# Patient Record
Sex: Male | Born: 1955 | Race: White | Hispanic: No | Marital: Married | Smoking: Never smoker
Health system: Southern US, Community
[De-identification: ages and names within clinical notes are randomized; demographics above are authoritative.]

---

## 1997-12-01 ENCOUNTER — Encounter: Admission: RE | Admit: 1997-12-01 | Discharge: 1998-02-18 | Payer: Self-pay | Admitting: Anesthesiology

## 2004-05-17 DIAGNOSIS — K219 Gastro-esophageal reflux disease without esophagitis: Secondary | ICD-10-CM | POA: Insufficient documentation

## 2004-05-17 DIAGNOSIS — I10 Essential (primary) hypertension: Secondary | ICD-10-CM | POA: Insufficient documentation

## 2004-05-17 DIAGNOSIS — G4730 Sleep apnea, unspecified: Secondary | ICD-10-CM | POA: Insufficient documentation

## 2013-03-18 DIAGNOSIS — C7A8 Other malignant neuroendocrine tumors: Secondary | ICD-10-CM | POA: Insufficient documentation

## 2013-05-15 DIAGNOSIS — D696 Thrombocytopenia, unspecified: Secondary | ICD-10-CM | POA: Insufficient documentation

## 2013-05-15 DIAGNOSIS — R17 Unspecified jaundice: Secondary | ICD-10-CM | POA: Insufficient documentation

## 2013-05-15 DIAGNOSIS — E876 Hypokalemia: Secondary | ICD-10-CM | POA: Insufficient documentation

## 2013-05-15 DIAGNOSIS — R1013 Epigastric pain: Secondary | ICD-10-CM | POA: Insufficient documentation

## 2019-12-07 ENCOUNTER — Ambulatory Visit (HOSPITAL_COMMUNITY)
Admission: RE | Admit: 2019-12-07 | Discharge: 2019-12-07 | Disposition: A | Discharge status: Home / Self Care | Payer: Medicare Other | Source: Ambulatory Visit | Attending: Pulmonary Disease | Admitting: Pulmonary Disease

## 2019-12-07 ENCOUNTER — Other Ambulatory Visit (HOSPITAL_COMMUNITY): Payer: Self-pay | Admitting: Family

## 2019-12-07 DIAGNOSIS — Z23 Encounter for immunization: Secondary | ICD-10-CM | POA: Diagnosis not present

## 2019-12-07 DIAGNOSIS — U071 COVID-19: Secondary | ICD-10-CM

## 2019-12-07 MED ORDER — METHYLPREDNISOLONE SODIUM SUCC 125 MG IJ SOLR: 125.0000 mg | Freq: Once | INTRAMUSCULAR | Status: DC | PRN

## 2019-12-07 MED ORDER — DIPHENHYDRAMINE HCL 50 MG/ML IJ SOLN: 50.0000 mg | Freq: Once | INTRAMUSCULAR | Status: DC | PRN

## 2019-12-07 MED ORDER — ACETAMINOPHEN 325 MG PO TABS
650.0000 mg | ORAL_TABLET | Freq: Once | ORAL | Status: AC
  Administered 2019-12-07: 650 mg via ORAL
  Filled 2019-12-07: qty 2

## 2019-12-07 MED ORDER — SODIUM CHLORIDE 0.9 % IV SOLN: INTRAVENOUS | Status: DC | PRN

## 2019-12-07 MED ORDER — FAMOTIDINE IN NACL 20-0.9 MG/50ML-% IV SOLN: 20.0000 mg | Freq: Once | INTRAVENOUS | Status: DC | PRN

## 2019-12-07 MED ORDER — EPINEPHRINE 0.3 MG/0.3ML IJ SOAJ: 0.3000 mg | Freq: Once | INTRAMUSCULAR | Status: DC | PRN

## 2019-12-07 MED ORDER — ALBUTEROL SULFATE HFA 108 (90 BASE) MCG/ACT IN AERS: 2.0000 | INHALATION_SPRAY | Freq: Once | RESPIRATORY_TRACT | Status: DC | PRN

## 2019-12-07 MED ORDER — SODIUM CHLORIDE 0.9 % IV SOLN: Freq: Once | INTRAVENOUS | Status: AC

## 2019-12-07 NOTE — Discharge Instructions (Signed)

## 2019-12-07 NOTE — Progress Notes (Signed)
I connected by phone with Michael Good on 12/07/2019 at 2:15 PM to discuss the potential use of a new treatment for mild to moderate COVID-19 viral infection in non-hospitalized patients.  This patient is a 64 y.o. male that meets the FDA criteria for Emergency Use Authorization of COVID monoclonal antibody casirivimab/imdevimab or bamlanivimab/eteseviamb.  Has a (+) direct SARS-CoV-2 viral test result  Has mild or moderate COVID-19   Is NOT hospitalized due to COVID-19  Is within 10 days of symptom onset  Has at least one of the high risk factor(s) for progression to severe COVID-19 and/or hospitalization as defined in EUA.  Specific high risk criteria : Older age (>/= 66 yo), Diabetes and Cardiovascular disease or hypertension   Symptoms of sweats and aches began 10/5.   I have spoken and communicated the following to the patient or parent/caregiver regarding COVID monoclonal antibody treatment:  1. FDA has authorized the emergency use for the treatment of mild to moderate COVID-19 in adults and pediatric patients with positive results of direct SARS-CoV-2 viral testing who are 49 years of age and older weighing at least 40 kg, and who are at high risk for progressing to severe COVID-19 and/or hospitalization.  2. The significant known and potential risks and benefits of COVID monoclonal antibody, and the extent to which such potential risks and benefits are unknown.  3. Information on available alternative treatments and the risks and benefits of those alternatives, including clinical trials.  4. Patients treated with COVID monoclonal antibody should continue to self-isolate and use infection control measures (e.g., wear mask, isolate, social distance, avoid sharing personal items, clean and disinfect "high touch" surfaces, and frequent handwashing) according to CDC guidelines.   5. The patient or parent/caregiver has the option to accept or refuse COVID monoclonal antibody  treatment.  After reviewing this information with the patient, the patient has agreed to receive one of the available covid 19 monoclonal antibodies and will be provided an appropriate fact sheet prior to infusion. [name], NP 12/07/2019 2:15 PM

## 2019-12-07 NOTE — Progress Notes (Signed)
  Diagnosis: COVID-19  Physician: Asencion Noble, MD   Procedure: Covid Infusion Clinic Med: bamlanivimab\etesevimab infusion - Provided patient with bamlanimivab\etesevimab fact sheet for patients, parents and caregivers prior to infusion.  Complications: No immediate complications noted.  Discharge: Discharged home   Michael Good 12/07/2019

## 2020-01-24 ENCOUNTER — Telehealth: Payer: Self-pay | Admitting: Oncology

## 2020-01-24 NOTE — Telephone Encounter (Signed)
Scheduled appointment per new patient referral. Per patient request appointment was scheduled near the end of December. Patient is aware of appointment date and time.

## 2020-01-30 ENCOUNTER — Ambulatory Visit
Admission: RE | Admit: 2020-01-30 | Discharge: 2020-01-30 | Disposition: A | Discharge status: Home / Self Care | Payer: Self-pay | Source: Ambulatory Visit | Attending: Oncology | Admitting: Oncology

## 2020-01-30 ENCOUNTER — Other Ambulatory Visit: Payer: Self-pay

## 2020-01-30 ENCOUNTER — Ambulatory Visit (HOSPITAL_COMMUNITY): Payer: Medicare Other | Admitting: Hematology

## 2020-01-30 DIAGNOSIS — C7B8 Other secondary neuroendocrine tumors: Secondary | ICD-10-CM

## 2020-01-30 NOTE — Progress Notes (Signed)
Requested outside images from [facility] be pushed to [employer].  Requested last two PET scans and most recent CT scan.  Received confirmation this has been done.  Order for outside films was placed.

## 2020-02-04 NOTE — Progress Notes (Signed)
Left voice message for patient informing him his original appointment scheduled for 12/28 at 2 pm with Dr. Benay Spice has to be moved to 12/30 at 2 pm instead.  I left my direct call back number for patient to call me back to confirm.

## 2020-02-05 ENCOUNTER — Telehealth: Payer: Self-pay

## 2020-02-05 NOTE — Telephone Encounter (Signed)
Patient calls back and states he is unable to come on Thursdays or Fridays for appointments due to work. He also was requesting a 4:00 pm appointment.  I explained that Dr. Gearldine Shown new patient slots are at 2 pm.  I have tentatively switched him to Monday 12/27 at 2 pm and he is to call me back tomorrow to let me know if he can come as he is at work and does not have his calendar with him.

## 2020-02-20 ENCOUNTER — Other Ambulatory Visit: Payer: Self-pay | Admitting: *Deleted

## 2020-02-20 NOTE — Progress Notes (Signed)
Reconciled med list from [place] records. Will review w/patient on 02/24/20 visit.

## 2020-02-24 ENCOUNTER — Inpatient Hospital Stay: Payer: Medicare Other | Admitting: Oncology

## 2020-02-25 ENCOUNTER — Ambulatory Visit: Payer: Medicare Other | Admitting: Oncology

## 2020-02-27 ENCOUNTER — Ambulatory Visit: Payer: Medicare Other | Admitting: Oncology

## 2020-03-10 ENCOUNTER — Other Ambulatory Visit: Payer: Self-pay

## 2020-03-10 ENCOUNTER — Inpatient Hospital Stay: Payer: Medicare Other | Attending: Oncology | Admitting: Oncology

## 2020-03-10 ENCOUNTER — Other Ambulatory Visit: Payer: Self-pay | Admitting: *Deleted

## 2020-03-10 DIAGNOSIS — E1165 Type 2 diabetes mellitus with hyperglycemia: Secondary | ICD-10-CM | POA: Insufficient documentation

## 2020-03-10 DIAGNOSIS — G4730 Sleep apnea, unspecified: Secondary | ICD-10-CM | POA: Insufficient documentation

## 2020-03-10 DIAGNOSIS — C7B03 Secondary carcinoid tumors of bone: Secondary | ICD-10-CM | POA: Insufficient documentation

## 2020-03-10 DIAGNOSIS — C7B02 Secondary carcinoid tumors of liver: Secondary | ICD-10-CM | POA: Diagnosis not present

## 2020-03-10 DIAGNOSIS — C7B09 Secondary carcinoid tumors of other sites: Secondary | ICD-10-CM | POA: Insufficient documentation

## 2020-03-10 DIAGNOSIS — Z79899 Other long term (current) drug therapy: Secondary | ICD-10-CM | POA: Insufficient documentation

## 2020-03-10 DIAGNOSIS — I10 Essential (primary) hypertension: Secondary | ICD-10-CM | POA: Insufficient documentation

## 2020-03-10 DIAGNOSIS — D696 Thrombocytopenia, unspecified: Secondary | ICD-10-CM | POA: Insufficient documentation

## 2020-03-10 DIAGNOSIS — C7A090 Malignant carcinoid tumor of the bronchus and lung: Secondary | ICD-10-CM | POA: Insufficient documentation

## 2020-03-10 DIAGNOSIS — N529 Male erectile dysfunction, unspecified: Secondary | ICD-10-CM | POA: Insufficient documentation

## 2020-03-10 DIAGNOSIS — Z801 Family history of malignant neoplasm of trachea, bronchus and lung: Secondary | ICD-10-CM | POA: Insufficient documentation

## 2020-03-10 DIAGNOSIS — Z8616 Personal history of COVID-19: Secondary | ICD-10-CM

## 2020-03-10 DIAGNOSIS — Z9049 Acquired absence of other specified parts of digestive tract: Secondary | ICD-10-CM

## 2020-03-10 MED ORDER — EVEROLIMUS 7.5 MG PO TABS: 7.5000 mg | ORAL_TABLET | Freq: Every day | ORAL | 0 refills | Status: DC

## 2020-03-10 NOTE — Progress Notes (Signed)
Michael Good New Patient Consult   Requesting MD: Janetta Hora, Md [place],  [place] [address]   [place]. 65 y.o.  [birth year]    Reason for Consult: Metastatic carcinoid tumor   HPI: Mr. Cheuvront reports developing "pneumonia "prior to being discovered to have a left lung lesion in 2004.  He underwent a left lower lobectomy in February 2004 at [facility].  He was confirmed to have a 2.2 cm typical carcinoid tumor. In November 2014 he underwent a laparoscopic cholecystectomy.  Imaging at that time showed a 4.4 cm liver lesion.  He underwent a central hepatectomy on 03/18/2013 with a segment 4 and 5 resection revealing a low-grade carcinoid tumor.  The Ki-6 7 returned at less than 5%. CTs in March 2016 showed no evidence of recurrence.  He had jaundice and common bile duct stones removed in March 2015 in March 2018.  CTs in June 2018 and July 2019 showed no evidence of recurrent disease.  A CT on 11/12/2018 showed at least 3 new liver lesions.  A CT-guided biopsy 11/28/2018 revealed no malignancy and was suspicious for sampling error.  A PET dotatate scan on 12/26/2018 showed at least 20 liver lesions and extensive bone metastases.  A hypermetabolic lesion in the pancreas tail was adjacent to a known pancreatic cystic lesion. A repeat biopsy of the left liver on 01/16/2019 showed a metastatic neuroendocrine tumor with aKi-67 of 10% (interpretation was limited due to scant remaining material and may not represent the overall lesion) mismatch repair protein expression was intact.  He began every 4 weeks Sandostatin at a dose of 30 mg on 02/27/2019.  On 09/18/2019 and Netspot revealed an increase in activity of the pancreas tail, liver, and bone metastases.  Mr. Vegh started dose reduced everolimus, 7.5 mg daily, and August 2021.  This was placed on hold in September due to new onset diabetes.  He was restarted on everolimus 01/22/2020.  He reports  missing the Sandostatin injection last month.  Mr. Housey is transferring his oncology care to [facility] since this is closer to his home in [place].  Past medical history: 1. Diabetes 2. COVID-19 infection September 2021 3. Hypertension 4. sleep apnea  Past surgical history: 1. cholecystectomy 2014 2. drainage of abdominal abscess 03/18/2013 3. Central hepatectomy, segment 4B, 5, and part of for a, small anterior part of segment 8 03/18/2013 4. left lung resection 5. umbilical hernia repair 08/17/3557 6. vasectomy 1997  Medications: Reviewed  Allergies:  Allergies  Allergen Reactions  . Erythromycin Nausea Only and Rash    Family history: His mother died of lung cancer and was a smoker  Social History:   He lives with his wife in [place].  He works as an Systems developer.  He does not use cigarettes or alcohol.  No transfusion history.  No risk factor for HIV or hepatitis.  ROS:   Positives include: Loose stool once per day, abdominal hernia, farsighted-uses reading glasses, erectile dysfunction  A complete ROS was otherwise negative.  Physical Exam:  There were no vitals taken for this visit.  HEENT: No thrush or ulcers Lungs: Clear bilaterally Cardiac: Regular rate and rhythm Abdomen: No mass, nontender, no hepatosplenomegaly, reducible ventral hernia  Vascular: No leg edema Lymph nodes: No cervical, supraclavicular, axillary, or inguinal nodes Neurologic: Alert and oriented, the motor exam appears intact in the upper and lower extremities bilaterally Skin: Plaques of gray/brown discoloration at the anterior aspect of the right knee,  elbows, and dorsum of the PIP joints Musculoskeletal: No spine tenderness   LAB:  Chromogranin A on 01/22/2020: 832  Hemoglobin 12.6, WBC 4.0, platelets 93,000, ANC 1.92 on 01/22/2020  Imaging:  As per HPI, Netspot images from October 2020 and July 2021 reviewed   Assessment/Plan:   1. Metastatic  carcinoid tumor  Left lower lobectomy February 2004 at [facility], 2.2 cm typical carcinoid tumor  Laparoscopic cholecystectomy November 2014, imaging revealed a 4.4 cm segment 8 liver lesion  03/18/2013-central hepatectomy and drainage of intra-abdominal abscess,- low-grade neuroendocrine carcinoma, tumor appeared totally excised,Ki-67 less than 5%  CT's 05/09/2014, 08/16/2016, 09/19/2017-no evidence of recurrent disease  CTs 914 2020-3 new liver lesions  12/26/2018 PET dotatate scan-at least 20 liver lesions and extensive bone metastases, hypermetabolic pancreas tail lesion 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  01/16/2019-biopsy of the left hepatic lobe lesion-metastatic neuroendocrine carcinoma,Ki-67 10%- interpretation limited due to scant remaining tumor may not be representative of the overall lesion, mismatch repair protein staining intact  02/27/2019-Sandostatin 30 mg every 4 weeks  09/18/2019- Netspot PET-increased activity in the pancreas tail and liver and bone metastases  Late August 2021-everolimus, 7.5 mg daily, placed on hold September 2021 due to acute hyperglycemia/new onset diabetes  01/22/2020-everolimus resumed 2. Diabetes 3. ERCP removal of bile duct stones March 2015, March 2018 4. Sleep apnea 5. Hypertension 6. COVID-19 infection September 2021 7. Cholecystectomy November 2014 8. Umbilical hernia repair 7/90/2409 9. Erectile dysfunction 10. Chronic thrombocytopenia   Disposition:   Mr Jeffries was diagnosed with a carcinoid tumor involving resection of a lung mass in 2004.  He now has hypermetabolic carcinoid tumor involving multiple liver lesions, the pancreas, and bones.  It is unclear whether current metastatic disease is related to a pancreas neuroendocrine tumor versus a lung primary.  I discussed the prognosis and treatment options with Mr. Radigan.  He appears asymptomatic at present.  He has been maintained on everolimus since August 2021 with an extended  treatment break after he was diagnosed with diabetes.  The plan is to continue Sandostatin and everolimus for now.  He does not wish to undergo repeat imaging at present.  He will return for labs, to include a baseline chromogranin a level, and Sandostatin in 1 week.  He will receive the COVID-19 vaccine when he is here next week.  He will be scheduled for an office and lab visit in 5 weeks.  We will plan for a restaging Netspot within the next few months.  I will present his case at the GI tumor conference.  We discussed possible treatment with Lutathera if he develops clear disease progression while on everolimus.  Betsy Coder, MD  03/10/2020, 2:04 PM

## 2020-03-10 NOTE — Progress Notes (Signed)
Met patient at his initial medical oncology appointment with Dr. Brad Sherrill.  Patient had previously been seen by [facility] however his oncologist left and was moving further away so he is seeking treatment here.  I explained my role as nurse navigator and he was given my direct contact information.  He will return next Tuesday 03/17/2020 (patient's preferred return date) for labs and Sandostatin injection (upon insurance approval).  He is going to seek getting the Covid vaccines locally as to to not have to drive the long distance and miss time from work.  I recommended his pharmacy will more than likely have the vaccine.  He verbalized an understanding. I escorted him to scheduling.  

## 2020-03-11 ENCOUNTER — Other Ambulatory Visit: Payer: Self-pay | Admitting: Oncology

## 2020-03-11 ENCOUNTER — Telehealth: Payer: Self-pay | Admitting: Oncology

## 2020-03-11 ENCOUNTER — Telehealth: Payer: Self-pay

## 2020-03-11 ENCOUNTER — Telehealth: Payer: Self-pay | Admitting: Pharmacist

## 2020-03-11 DIAGNOSIS — C7A090 Malignant carcinoid tumor of the bronchus and lung: Secondary | ICD-10-CM

## 2020-03-11 MED ORDER — EVEROLIMUS 7.5 MG PO TABS: 7.5000 mg | ORAL_TABLET | Freq: Every day | ORAL | 0 refills | Status: DC

## 2020-03-11 MED FILL — EVEROLIMUS 7.5 MG TABS: 7.5 | 28 days supply | Qty: 28 | Fill #0

## 2020-03-11 NOTE — Telephone Encounter (Addendum)
Oral Oncology Pharmacist Encounter  Received prescription for Afinitor (everolimus) for the continuation of treatment of metastatic carcinoid tumor in conjunction with Sandostatin, planned duration until disease progression or unacceptable drug toxicity.  Prescription dose and frequency assessed for appropriateness. Patient has been on Afinitor since August 2021. Quantity updated from #30 to #28 due to package size of product. Appropriate for continuation.   CBC w/ Diff and CMP from 01/22/20 assessed, noted pltc of 93 K/uL - CMP WNL. No change in dose warranted.  Current medication list in Epic reviewed, no relevant/significant DDIs with Afinitor identified.  Evaluated chart and no patient barriers to medication adherence noted.   Prescription has been e-scribed to the [facility] for benefits analysis and approval.  Oral Oncology Clinic will continue to follow for insurance authorization, copayment issues, and follow-up counseling.  Leron Croak, PharmD, BCPS Hematology/Oncology Clinical Pharmacist [facility] [phone] 03/11/2020 10:15 AM

## 2020-03-11 NOTE — Telephone Encounter (Signed)
Oral Chemotherapy Pharmacist Encounter  I spoke with patient for overview of: Afinitor (everolimus) for the continued treatment of metastatic carcinoid tumor in conjunction with Sandostatin, planned duration until disease progression or unacceptable toxicity.   Reviewed with patient administration, dosing, side effects, monitoring, drug-food interactions, safe handling, storage, and disposal.  Patient will take Afinitor 7.5mg  tablets, 1 tablet by mouth once daily, with water, without regard to food.  Patient understands to take Afinitor consistently with regards to food and at approximately the same time each day.  Patient knows to aviod grapefruit or grapefruit juice while on therapy with Afinitor.  Afinitor start date: August 2021  Adverse effects include but are not limited to: mouth sores, GI upset, nausea, diarrhea, constipation, rash, increased blood sugars, decreased blood counts, increased blood pressure, pulmonary toxicity, and edema.   Reviewed with patient importance of keeping a medication schedule and plan for any missed doses. No barriers to medication adherence identified.  Medication reconciliation performed and medication/allergy list updated.  Insurance authorization for Afinitor has been obtained. Test claim at the pharmacy revealed copayment $70 for 1st fill of Afinitor. This will ship from the [facility] on 03/11/20 to deliver to patient's home on 03/12/20.  Patient informed the pharmacy will reach out 5-7 days prior to needing next fill of Afinitor to coordinate continued medication acquisition to prevent break in therapy.  All questions answered.  Mr. Brayfield voiced understanding and appreciation.   Patient declined medication education handout since he has been on treatment since August 2021. Patient knows to call the office with questions or concerns. Oral Chemotherapy Clinic phone number provided to patient.   Leron Croak, PharmD,  BCPS Hematology/Oncology Clinical Pharmacist [facility] [phone] 03/11/2020 4:09 PM

## 2020-03-11 NOTE — Telephone Encounter (Signed)
Scheduled appointments per 1/11 los. Spoke to patient who is aware of appointments on 1/18. Will have  Updated calendar printed for patient.

## 2020-03-11 NOTE — Telephone Encounter (Signed)
Oral Oncology Patient Advocate Encounter  After completing a benefits investigation, prior authorization for Afinitor is not required at this time through [facility].  Patient's copay is $70.   Michael Good Patient [name] Phone [phone] Fax [phone] 03/11/2020 10:38 AM

## 2020-03-17 ENCOUNTER — Inpatient Hospital Stay: Payer: Medicare Other

## 2020-03-18 ENCOUNTER — Other Ambulatory Visit: Payer: Self-pay

## 2020-03-18 NOTE — Progress Notes (Signed)
The proposed treatment discussed in conference is for discussion purposes only and is not a binding recommendation.  The patients have not been physically examined, or presented with their treatment options.  Therefore, final treatment plans cannot be decided.   

## 2020-03-24 ENCOUNTER — Inpatient Hospital Stay: Payer: Medicare Other

## 2020-03-24 ENCOUNTER — Other Ambulatory Visit: Payer: Self-pay

## 2020-03-24 VITALS — BP 147/73 | HR 72

## 2020-03-24 DIAGNOSIS — C7A090 Malignant carcinoid tumor of the bronchus and lung: Secondary | ICD-10-CM | POA: Diagnosis not present

## 2020-03-24 DIAGNOSIS — C7B09 Secondary carcinoid tumors of other sites: Secondary | ICD-10-CM | POA: Diagnosis not present

## 2020-03-24 DIAGNOSIS — N529 Male erectile dysfunction, unspecified: Secondary | ICD-10-CM | POA: Diagnosis not present

## 2020-03-24 DIAGNOSIS — Z9049 Acquired absence of other specified parts of digestive tract: Secondary | ICD-10-CM | POA: Diagnosis not present

## 2020-03-24 DIAGNOSIS — E1165 Type 2 diabetes mellitus with hyperglycemia: Secondary | ICD-10-CM | POA: Diagnosis not present

## 2020-03-24 DIAGNOSIS — D696 Thrombocytopenia, unspecified: Secondary | ICD-10-CM | POA: Diagnosis not present

## 2020-03-24 DIAGNOSIS — C7B03 Secondary carcinoid tumors of bone: Secondary | ICD-10-CM | POA: Diagnosis present

## 2020-03-24 DIAGNOSIS — Z8616 Personal history of COVID-19: Secondary | ICD-10-CM | POA: Diagnosis not present

## 2020-03-24 DIAGNOSIS — Z79899 Other long term (current) drug therapy: Secondary | ICD-10-CM | POA: Diagnosis not present

## 2020-03-24 DIAGNOSIS — C7B02 Secondary carcinoid tumors of liver: Secondary | ICD-10-CM | POA: Diagnosis present

## 2020-03-24 DIAGNOSIS — G4730 Sleep apnea, unspecified: Secondary | ICD-10-CM | POA: Diagnosis not present

## 2020-03-24 DIAGNOSIS — I10 Essential (primary) hypertension: Secondary | ICD-10-CM | POA: Diagnosis not present

## 2020-03-24 DIAGNOSIS — Z801 Family history of malignant neoplasm of trachea, bronchus and lung: Secondary | ICD-10-CM | POA: Diagnosis not present

## 2020-03-24 LAB — CBC WITH DIFFERENTIAL (CANCER CENTER ONLY)
Abs Immature Granulocytes: 0.01 10*3/uL (ref 0.00–0.07)
Basophils Absolute: 0 10*3/uL (ref 0.0–0.1)
Basophils Relative: 1 %
Eosinophils Absolute: 0.1 10*3/uL (ref 0.0–0.5)
Eosinophils Relative: 3 %
HCT: 36.4 % — ABNORMAL LOW (ref 39.0–52.0)
Hemoglobin: 13.1 g/dL (ref 13.0–17.0)
Immature Granulocytes: 0 %
Lymphocytes Relative: 30 %
Lymphs Abs: 1.3 10*3/uL (ref 0.7–4.0)
MCH: 30.4 pg (ref 26.0–34.0)
MCHC: 36 g/dL (ref 30.0–36.0)
MCV: 84.5 fL (ref 80.0–100.0)
Monocytes Absolute: 0.2 10*3/uL (ref 0.1–1.0)
Monocytes Relative: 6 %
Neutro Abs: 2.6 10*3/uL (ref 1.7–7.7)
Neutrophils Relative %: 60 %
Platelet Count: 89 10*3/uL — ABNORMAL LOW (ref 150–400)
RBC: 4.31 MIL/uL (ref 4.22–5.81)
RDW: 12.6 % (ref 11.5–15.5)
WBC Count: 4.3 10*3/uL (ref 4.0–10.5)
nRBC: 0 % (ref 0.0–0.2)

## 2020-03-24 LAB — CMP (CANCER CENTER ONLY)
ALT: 90 U/L — ABNORMAL HIGH (ref 0–44)
AST: 63 U/L — ABNORMAL HIGH (ref 15–41)
Albumin: 4.2 g/dL (ref 3.5–5.0)
Alkaline Phosphatase: 112 U/L (ref 38–126)
Anion gap: 9 (ref 5–15)
BUN: 13 mg/dL (ref 8–23)
CO2: 26 mmol/L (ref 22–32)
Calcium: 8.7 mg/dL — ABNORMAL LOW (ref 8.9–10.3)
Chloride: 102 mmol/L (ref 98–111)
Creatinine: 1.4 mg/dL — ABNORMAL HIGH (ref 0.61–1.24)
GFR, Estimated: 55 mL/min — ABNORMAL LOW (ref >60)
Glucose, Bld: 393 mg/dL — ABNORMAL HIGH (ref 70–99)
Potassium: 3.8 mmol/L (ref 3.5–5.1)
Sodium: 137 mmol/L (ref 135–145)
Total Bilirubin: 1.6 mg/dL — ABNORMAL HIGH (ref 0.3–1.2)
Total Protein: 7.8 g/dL (ref 6.5–8.1)

## 2020-03-24 LAB — MAGNESIUM: Magnesium: 1.7 mg/dL (ref 1.7–2.4)

## 2020-03-24 LAB — LIPID PANEL
Cholesterol: 199 mg/dL (ref 0–200)
HDL: 43 mg/dL (ref >40)
LDL Cholesterol: 108 mg/dL — ABNORMAL HIGH (ref 0–99)
Total CHOL/HDL Ratio: 4.6 RATIO
Triglycerides: 238 mg/dL — ABNORMAL HIGH (ref <150)
VLDL: 48 mg/dL — ABNORMAL HIGH (ref 0–40)

## 2020-03-24 LAB — PHOSPHORUS: Phosphorus: 3.1 mg/dL (ref 2.5–4.6)

## 2020-03-24 MED ORDER — OCTREOTIDE ACETATE 30 MG IM KIT
PACK | INTRAMUSCULAR | Status: AC
  Filled 2020-03-24: qty 1

## 2020-03-24 MED ORDER — OCTREOTIDE ACETATE 30 MG IM KIT
30.0000 mg | PACK | Freq: Once | INTRAMUSCULAR | Status: AC
  Administered 2020-03-24: 30 mg via INTRAMUSCULAR

## 2020-03-24 NOTE — Patient Instructions (Signed)
Octreotide injection solution What is this medicine? OCTREOTIDE (ok TREE oh tide) is used to reduce blood levels of growth hormone in patients with a condition called acromegaly. This medicine also reduces flushing and watery diarrhea caused by certain types of cancer. This medicine may be used for other purposes; ask your health care provider or pharmacist if you have questions. COMMON BRAND NAME(S): Bynfezia, Sandostatin What should I tell my health care provider before I take this medicine? They need to know if you have any of these conditions:  diabetes  gallbladder disease  kidney disease  liver disease  thyroid disease  an unusual or allergic reaction to octreotide, other medicines, foods, dyes, or preservatives  pregnant or trying to get pregnant  breast-feeding How should I use this medicine? This medicine is for injection under the skin or into a vein (only in emergency situations). It is usually given by a health care professional in a hospital or clinic setting. If you get this medicine at home, you will be taught how to prepare and give this medicine. Allow the injection solution to come to room temperature before use. Do not warm it artificially. Use exactly as directed. Take your medicine at regular intervals. Do not take your medicine more often than directed. It is important that you put your used needles and syringes in a special sharps container. Do not put them in a trash can. If you do not have a sharps container, call your pharmacist or healthcare provider to get one. Talk to your pediatrician regarding the use of this medicine in children. Special care may be needed. Overdosage: If you think you have taken too much of this medicine contact a poison control center or emergency room at once. NOTE: This medicine is only for you. Do not share this medicine with others. What if I miss a dose? If you miss a dose, take it as soon as you can. If it is almost time for your  next dose, take only that dose. Do not take double or extra doses. What may interact with this medicine?  bromocriptine  certain medicines for blood pressure, heart disease, irregular heartbeat  cyclosporine  diuretics  medicines for diabetes, including insulin  quinidine This list may not describe all possible interactions. Give your health care provider a list of all the medicines, herbs, non-prescription drugs, or dietary supplements you use. Also tell them if you smoke, drink alcohol, or use illegal drugs. Some items may interact with your medicine. What should I watch for while using this medicine? Visit your doctor or health care professional for regular checks on your progress. To help reduce irritation at the injection site, use a different site for each injection and make sure the solution is at room temperature before use. This medicine may cause decreases in blood sugar. Signs of low blood sugar include chills, cool, pale skin or cold sweats, drowsiness, extreme hunger, fast heartbeat, headache, nausea, nervousness or anxiety, shakiness, trembling, unsteadiness, tiredness, or weakness. Contact your doctor or health care professional right away if you experience any of these symptoms. This medicine may increase blood sugar. Ask your healthcare provider if changes in diet or medicines are needed if you have diabetes. This medicine may cause a decrease in vitamin B12. You should make sure that you get enough vitamin B12 while you are taking this medicine. Discuss the foods you eat and the vitamins you take with your health care professional. What side effects may I notice from receiving this medicine? Side   effects that you should report to your doctor or health care professional as soon as possible:  allergic reactions like skin rash, itching or hives, swelling of the face, lips, or tongue  fast, slow, or irregular heartbeat  right upper belly pain  severe stomach pain  signs  and symptoms of high blood sugar such as being more thirsty or hungry or having to urinate more than normal. You may also feel very tired or have blurry vision.  signs and symptoms of low blood sugar such as feeling anxious; confusion; dizziness; increased hunger; unusually weak or tired; increased sweating; shakiness; cold, clammy skin; irritable; headache; blurred vision; fast heartbeat; loss of consciousness  unusually weak or tired Side effects that usually do not require medical attention (report to your doctor or health care professional if they continue or are bothersome):  diarrhea  dizziness  gas  headache  nausea, vomiting  pain, redness, or irritation at site where injected  upset stomach This list may not describe all possible side effects. Call your doctor for medical advice about side effects. You may report side effects to FDA at 1-800-FDA-1088. Where should I keep my medicine? Keep out of the reach of children. Store in a refrigerator between 2 and 8 degrees C (36 and 46 degrees F). Protect from light. Allow to come to room temperature naturally. Do not use artificial heat. If protected from light, the injection may be stored at room temperature between 20 and 30 degrees C (70 and 86 degrees F) for 14 days. After the initial use, throw away any unused portion of a multiple dose vial after 14 days. Throw away unused portions of the ampules after use. NOTE: This sheet is a summary. It may not cover all possible information. If you have questions about this medicine, talk to your doctor, pharmacist, or health care provider.  2021 Elsevier/Gold Standard (2018-09-13 13:33:09)  

## 2020-03-27 LAB — CHROMOGRANIN A: Chromogranin A (ng/mL): 2994 ng/mL — ABNORMAL HIGH (ref 0.0–101.8)

## 2020-04-08 ENCOUNTER — Other Ambulatory Visit: Payer: Self-pay | Admitting: Oncology

## 2020-04-08 DIAGNOSIS — C7A090 Malignant carcinoid tumor of the bronchus and lung: Secondary | ICD-10-CM

## 2020-04-09 ENCOUNTER — Other Ambulatory Visit: Payer: Self-pay | Admitting: *Deleted

## 2020-04-09 DIAGNOSIS — C7A090 Malignant carcinoid tumor of the bronchus and lung: Secondary | ICD-10-CM

## 2020-04-09 MED ORDER — EVEROLIMUS 7.5 MG PO TABS: 7.5000 mg | ORAL_TABLET | Freq: Every day | ORAL | 0 refills | Status: DC

## 2020-04-09 MED FILL — EVEROLIMUS 7.5 MG TABS: 7.5 | 30 days supply | Qty: 30 | Fill #0

## 2020-04-09 NOTE — Progress Notes (Signed)
Re-sent everolimus script to [facility] at request of oral chemo program. His insurance will cover quantity of #30.

## 2020-04-21 ENCOUNTER — Other Ambulatory Visit: Payer: Self-pay

## 2020-04-21 ENCOUNTER — Inpatient Hospital Stay: Payer: Medicare Other | Attending: Oncology

## 2020-04-21 ENCOUNTER — Inpatient Hospital Stay (HOSPITAL_BASED_OUTPATIENT_CLINIC_OR_DEPARTMENT_OTHER): Payer: Medicare Other | Admitting: Nurse Practitioner

## 2020-04-21 ENCOUNTER — Inpatient Hospital Stay: Payer: Medicare Other

## 2020-04-21 ENCOUNTER — Encounter: Payer: Self-pay | Admitting: Nurse Practitioner

## 2020-04-21 ENCOUNTER — Encounter: Payer: Self-pay | Admitting: Oncology

## 2020-04-21 VITALS — BP 149/76 | HR 78 | Temp 97.8°F | Resp 20 | Ht 67.0 in | Wt 198.8 lb

## 2020-04-21 DIAGNOSIS — I10 Essential (primary) hypertension: Secondary | ICD-10-CM | POA: Diagnosis not present

## 2020-04-21 DIAGNOSIS — C7A090 Malignant carcinoid tumor of the bronchus and lung: Secondary | ICD-10-CM

## 2020-04-21 DIAGNOSIS — D696 Thrombocytopenia, unspecified: Secondary | ICD-10-CM | POA: Diagnosis not present

## 2020-04-21 DIAGNOSIS — C7B02 Secondary carcinoid tumors of liver: Secondary | ICD-10-CM | POA: Diagnosis present

## 2020-04-21 DIAGNOSIS — Z8616 Personal history of COVID-19: Secondary | ICD-10-CM | POA: Insufficient documentation

## 2020-04-21 DIAGNOSIS — N529 Male erectile dysfunction, unspecified: Secondary | ICD-10-CM | POA: Insufficient documentation

## 2020-04-21 DIAGNOSIS — G4730 Sleep apnea, unspecified: Secondary | ICD-10-CM | POA: Diagnosis not present

## 2020-04-21 DIAGNOSIS — C7B8 Other secondary neuroendocrine tumors: Secondary | ICD-10-CM

## 2020-04-21 DIAGNOSIS — E119 Type 2 diabetes mellitus without complications: Secondary | ICD-10-CM | POA: Diagnosis not present

## 2020-04-21 DIAGNOSIS — C7B03 Secondary carcinoid tumors of bone: Secondary | ICD-10-CM | POA: Insufficient documentation

## 2020-04-21 DIAGNOSIS — Z79899 Other long term (current) drug therapy: Secondary | ICD-10-CM | POA: Diagnosis not present

## 2020-04-21 LAB — CBC WITH DIFFERENTIAL (CANCER CENTER ONLY)
Abs Immature Granulocytes: 0.01 10*3/uL (ref 0.00–0.07)
Basophils Absolute: 0 10*3/uL (ref 0.0–0.1)
Basophils Relative: 1 %
Eosinophils Absolute: 0.1 10*3/uL (ref 0.0–0.5)
Eosinophils Relative: 3 %
HCT: 35.1 % — ABNORMAL LOW (ref 39.0–52.0)
Hemoglobin: 12.8 g/dL — ABNORMAL LOW (ref 13.0–17.0)
Immature Granulocytes: 0 %
Lymphocytes Relative: 29 %
Lymphs Abs: 1.3 10*3/uL (ref 0.7–4.0)
MCH: 30.4 pg (ref 26.0–34.0)
MCHC: 36.5 g/dL — ABNORMAL HIGH (ref 30.0–36.0)
MCV: 83.4 fL (ref 80.0–100.0)
Monocytes Absolute: 0.3 10*3/uL (ref 0.1–1.0)
Monocytes Relative: 6 %
Neutro Abs: 2.8 10*3/uL (ref 1.7–7.7)
Neutrophils Relative %: 61 %
Platelet Count: 108 10*3/uL — ABNORMAL LOW (ref 150–400)
RBC: 4.21 MIL/uL — ABNORMAL LOW (ref 4.22–5.81)
RDW: 12.9 % (ref 11.5–15.5)
WBC Count: 4.6 10*3/uL (ref 4.0–10.5)
nRBC: 0 % (ref 0.0–0.2)

## 2020-04-21 LAB — CMP (CANCER CENTER ONLY)
ALT: 57 U/L — ABNORMAL HIGH (ref 0–44)
AST: 47 U/L — ABNORMAL HIGH (ref 15–41)
Albumin: 4.3 g/dL (ref 3.5–5.0)
Alkaline Phosphatase: 109 U/L (ref 38–126)
Anion gap: 8 (ref 5–15)
BUN: 17 mg/dL (ref 8–23)
CO2: 24 mmol/L (ref 22–32)
Calcium: 8.5 mg/dL — ABNORMAL LOW (ref 8.9–10.3)
Chloride: 104 mmol/L (ref 98–111)
Creatinine: 1.32 mg/dL — ABNORMAL HIGH (ref 0.61–1.24)
GFR, Estimated: 59 mL/min — ABNORMAL LOW (ref >60)
Glucose, Bld: 283 mg/dL — ABNORMAL HIGH (ref 70–99)
Potassium: 3.8 mmol/L (ref 3.5–5.1)
Sodium: 136 mmol/L (ref 135–145)
Total Bilirubin: 1.7 mg/dL — ABNORMAL HIGH (ref 0.3–1.2)
Total Protein: 7.6 g/dL (ref 6.5–8.1)

## 2020-04-21 MED ORDER — OCTREOTIDE ACETATE 30 MG IM KIT
30.0000 mg | PACK | Freq: Once | INTRAMUSCULAR | Status: AC
  Administered 2020-04-21: 30 mg via INTRAMUSCULAR

## 2020-04-21 MED ORDER — OCTREOTIDE ACETATE 30 MG IM KIT
PACK | INTRAMUSCULAR | Status: AC
  Filled 2020-04-21: qty 1

## 2020-04-21 NOTE — Progress Notes (Signed)
Met with patient at registration to introduce myself as Financial Resource Specialist and to offer available resources.  Discussed one-time $1000 [employer] grant and qualifications to assist with personal expenses while going through treatment.  Gave him my card if interested in applying and for any additional financial questions or concerns.  

## 2020-04-21 NOTE — Progress Notes (Signed)
  [place] OFFICE PROGRESS NOTE   Diagnosis: Metastatic carcinoid tumor  INTERVAL HISTORY:   Michael Good returns as scheduled.  He continues everolimus and every 4-week of Sandostatin, most recent injection 03/24/2020.  He denies nausea/vomiting.  No mouth sores.  No significant diarrhea.  He occasionally notes a small "bump" on his scalp.  No flushing episodes.  He denies pain.  No fever, cough, shortness of breath.  No change in baseline marginal appetite.  Objective:  Vital signs in last 24 hours:  Blood pressure (!) 149/76, pulse 78, temperature 97.8 F (36.6 C), temperature source Tympanic, resp. rate 20, height $RemoveBe'5\' 7"'WaUhetGUq$  (1.702 m), weight 198 lb 12.8 oz (90.2 kg), SpO2 100 %.    HEENT: No thrush or ulcers. Resp: Lungs clear bilaterally. Cardio: Regular rate and rhythm. GI: Abdomen soft and nontender.  No hepatomegaly. Vascular: No leg edema. Neuro: Alert and oriented. Skin: No rash.  Palms without erythema.   Lab Results:  Lab Results  Component Value Date   WBC 4.6 04/21/2020   HGB 12.8 (L) 04/21/2020   HCT 35.1 (L) 04/21/2020   MCV 83.4 04/21/2020   PLT 108 (L) 04/21/2020   NEUTROABS 2.8 04/21/2020    Imaging:  No results found.  Medications: I have reviewed the patient's current medications.  Assessment/Plan: 1. Metastatic carcinoid tumor ? Left lower lobectomy February 2004 at [facility], 2.2 cm typical carcinoid tumor ? Laparoscopic cholecystectomy November 2014, imaging revealed a 4.4 cm segment 8 liver lesion ? 03/18/2013-central hepatectomy and drainage of intra-abdominal abscess,- low-grade neuroendocrine carcinoma, tumor appeared totally excised,Ki-67 less than 5% ? CT's 05/09/2014, 08/16/2016, 09/19/2017-no evidence of recurrent disease ? CTs 914 2020-3 new liver lesions ? 12/26/2018 PET dotatate scan-at least 20 liver lesions and extensive bone metastases, hypermetabolic pancreas tail lesion adjacent to the known pancreatic cystic  lesion ? 01/16/2019-biopsy of the left hepatic lobe lesion-metastatic neuroendocrine carcinoma,Ki-67 10%- interpretation limited due to scant remaining tumor may not be representative of the overall lesion, mismatch repair protein staining intact ? 02/27/2019-Sandostatin 30 mg every 4 weeks ? 09/18/2019- Netspot PET-increased activity in the pancreas tail and liver and bone metastases ? Late August 2021-everolimus, 7.5 mg daily, placed on hold September 2021 due to acute hyperglycemia/new onset diabetes ? 01/22/2020-everolimus resumed 2. Diabetes 3. ERCP removal of bile duct stones March 2015, March 2018 4. Sleep apnea 5. Hypertension 6. COVID-19 infection September 2021 7. Cholecystectomy November 2014 8. Umbilical hernia repair 2/72/5366 9. Erectile dysfunction 10. Chronic thrombocytopenia   Disposition: Michael Good appears stable.  There is no clinical evidence of disease progression.  He will continue everolimus and monthly Sandostatin.  We discussed timing of restaging studies.  He prefers to wait until April or May so he is through the busy time of year at his work.  We reviewed the CBC and chemistry panel from today.  Labs adequate to continue with treatment as above.  He has stable mild to moderate thrombocytopenia.  We discussed the elevated blood sugar.  He plans to follow-up with his PCP.  He will return for lab, follow-up, Sandostatin in approximately 1 month.  We are available to see him sooner if needed.    Ned Card ANP/GNP-BC   04/21/2020  1:46 PM

## 2020-04-22 ENCOUNTER — Telehealth: Payer: Self-pay | Admitting: Nurse Practitioner

## 2020-04-22 NOTE — Telephone Encounter (Signed)
Scheduled appointments per 2/22 los. Spoke to patient who is aware of appointments date and times.  

## 2020-05-13 ENCOUNTER — Other Ambulatory Visit: Payer: Self-pay | Admitting: Oncology

## 2020-05-13 DIAGNOSIS — C7A090 Malignant carcinoid tumor of the bronchus and lung: Secondary | ICD-10-CM

## 2020-05-21 ENCOUNTER — Other Ambulatory Visit: Payer: Medicare Other

## 2020-05-21 ENCOUNTER — Ambulatory Visit: Payer: Medicare Other | Admitting: Oncology

## 2020-05-21 ENCOUNTER — Ambulatory Visit: Payer: Medicare Other

## 2020-05-26 ENCOUNTER — Inpatient Hospital Stay: Payer: Medicare Other

## 2020-05-26 ENCOUNTER — Inpatient Hospital Stay (HOSPITAL_BASED_OUTPATIENT_CLINIC_OR_DEPARTMENT_OTHER): Payer: Medicare Other | Admitting: Oncology

## 2020-05-26 ENCOUNTER — Encounter: Payer: Self-pay | Admitting: Oncology

## 2020-05-26 ENCOUNTER — Other Ambulatory Visit: Payer: Self-pay

## 2020-05-26 ENCOUNTER — Inpatient Hospital Stay: Payer: Medicare Other | Attending: Oncology

## 2020-05-26 VITALS — BP 160/91 | HR 85 | Temp 97.6°F | Resp 20 | Ht 67.0 in | Wt 198.1 lb

## 2020-05-26 DIAGNOSIS — C7B02 Secondary carcinoid tumors of liver: Secondary | ICD-10-CM | POA: Insufficient documentation

## 2020-05-26 DIAGNOSIS — E119 Type 2 diabetes mellitus without complications: Secondary | ICD-10-CM | POA: Insufficient documentation

## 2020-05-26 DIAGNOSIS — I10 Essential (primary) hypertension: Secondary | ICD-10-CM | POA: Insufficient documentation

## 2020-05-26 DIAGNOSIS — C7A090 Malignant carcinoid tumor of the bronchus and lung: Secondary | ICD-10-CM | POA: Diagnosis not present

## 2020-05-26 DIAGNOSIS — N529 Male erectile dysfunction, unspecified: Secondary | ICD-10-CM | POA: Diagnosis not present

## 2020-05-26 DIAGNOSIS — G4730 Sleep apnea, unspecified: Secondary | ICD-10-CM | POA: Insufficient documentation

## 2020-05-26 DIAGNOSIS — C7B03 Secondary carcinoid tumors of bone: Secondary | ICD-10-CM | POA: Insufficient documentation

## 2020-05-26 DIAGNOSIS — D696 Thrombocytopenia, unspecified: Secondary | ICD-10-CM | POA: Insufficient documentation

## 2020-05-26 DIAGNOSIS — R197 Diarrhea, unspecified: Secondary | ICD-10-CM | POA: Diagnosis not present

## 2020-05-26 DIAGNOSIS — C7B8 Other secondary neuroendocrine tumors: Secondary | ICD-10-CM

## 2020-05-26 LAB — CBC WITH DIFFERENTIAL (CANCER CENTER ONLY)
Abs Immature Granulocytes: 0.01 10*3/uL (ref 0.00–0.07)
Basophils Absolute: 0 10*3/uL (ref 0.0–0.1)
Basophils Relative: 0 %
Eosinophils Absolute: 0.1 10*3/uL (ref 0.0–0.5)
Eosinophils Relative: 2 %
HCT: 33.6 % — ABNORMAL LOW (ref 39.0–52.0)
Hemoglobin: 12.3 g/dL — ABNORMAL LOW (ref 13.0–17.0)
Immature Granulocytes: 0 %
Lymphocytes Relative: 32 %
Lymphs Abs: 1.1 10*3/uL (ref 0.7–4.0)
MCH: 30.8 pg (ref 26.0–34.0)
MCHC: 36.6 g/dL — ABNORMAL HIGH (ref 30.0–36.0)
MCV: 84 fL (ref 80.0–100.0)
Monocytes Absolute: 0.2 10*3/uL (ref 0.1–1.0)
Monocytes Relative: 6 %
Neutro Abs: 2 10*3/uL (ref 1.7–7.7)
Neutrophils Relative %: 60 %
Platelet Count: 87 10*3/uL — ABNORMAL LOW (ref 150–400)
RBC: 4 MIL/uL — ABNORMAL LOW (ref 4.22–5.81)
RDW: 13.4 % (ref 11.5–15.5)
WBC Count: 3.4 10*3/uL — ABNORMAL LOW (ref 4.0–10.5)
nRBC: 0 % (ref 0.0–0.2)

## 2020-05-26 LAB — CMP (CANCER CENTER ONLY)
ALT: 49 U/L — ABNORMAL HIGH (ref 0–44)
AST: 46 U/L — ABNORMAL HIGH (ref 15–41)
Albumin: 3.9 g/dL (ref 3.5–5.0)
Alkaline Phosphatase: 115 U/L (ref 38–126)
Anion gap: 10 (ref 5–15)
BUN: 14 mg/dL (ref 8–23)
CO2: 27 mmol/L (ref 22–32)
Calcium: 8.2 mg/dL — ABNORMAL LOW (ref 8.9–10.3)
Chloride: 102 mmol/L (ref 98–111)
Creatinine: 1.19 mg/dL (ref 0.61–1.24)
GFR, Estimated: >60 mL/min (ref >60)
Glucose, Bld: 249 mg/dL — ABNORMAL HIGH (ref 70–99)
Potassium: 3.8 mmol/L (ref 3.5–5.1)
Sodium: 139 mmol/L (ref 135–145)
Total Bilirubin: 1.2 mg/dL (ref 0.3–1.2)
Total Protein: 7.1 g/dL (ref 6.5–8.1)

## 2020-05-26 MED ORDER — OCTREOTIDE ACETATE 30 MG IM KIT
PACK | INTRAMUSCULAR | Status: AC
  Filled 2020-05-26: qty 1

## 2020-05-26 MED ORDER — OCTREOTIDE ACETATE 30 MG IM KIT
30.0000 mg | PACK | Freq: Once | INTRAMUSCULAR | Status: AC
  Administered 2020-05-26: 30 mg via INTRAMUSCULAR

## 2020-05-26 NOTE — Patient Instructions (Signed)
Octreotide injection solution What is this medicine? OCTREOTIDE (ok TREE oh tide) is used to reduce blood levels of growth hormone in patients with a condition called acromegaly. This medicine also reduces flushing and watery diarrhea caused by certain types of cancer. This medicine may be used for other purposes; ask your health care provider or pharmacist if you have questions. COMMON BRAND NAME(S): Bynfezia, Sandostatin What should I tell my health care provider before I take this medicine? They need to know if you have any of these conditions:  diabetes  gallbladder disease  kidney disease  liver disease  thyroid disease  an unusual or allergic reaction to octreotide, other medicines, foods, dyes, or preservatives  pregnant or trying to get pregnant  breast-feeding How should I use this medicine? This medicine is for injection under the skin or into a vein (only in emergency situations). It is usually given by a health care professional in a hospital or clinic setting. If you get this medicine at home, you will be taught how to prepare and give this medicine. Allow the injection solution to come to room temperature before use. Do not warm it artificially. Use exactly as directed. Take your medicine at regular intervals. Do not take your medicine more often than directed. It is important that you put your used needles and syringes in a special sharps container. Do not put them in a trash can. If you do not have a sharps container, call your pharmacist or healthcare provider to get one. Talk to your pediatrician regarding the use of this medicine in children. Special care may be needed. Overdosage: If you think you have taken too much of this medicine contact a poison control center or emergency room at once. NOTE: This medicine is only for you. Do not share this medicine with others. What if I miss a dose? If you miss a dose, take it as soon as you can. If it is almost time for your  next dose, take only that dose. Do not take double or extra doses. What may interact with this medicine?  bromocriptine  certain medicines for blood pressure, heart disease, irregular heartbeat  cyclosporine  diuretics  medicines for diabetes, including insulin  quinidine This list may not describe all possible interactions. Give your health care provider a list of all the medicines, herbs, non-prescription drugs, or dietary supplements you use. Also tell them if you smoke, drink alcohol, or use illegal drugs. Some items may interact with your medicine. What should I watch for while using this medicine? Visit your doctor or health care professional for regular checks on your progress. To help reduce irritation at the injection site, use a different site for each injection and make sure the solution is at room temperature before use. This medicine may cause decreases in blood sugar. Signs of low blood sugar include chills, cool, pale skin or cold sweats, drowsiness, extreme hunger, fast heartbeat, headache, nausea, nervousness or anxiety, shakiness, trembling, unsteadiness, tiredness, or weakness. Contact your doctor or health care professional right away if you experience any of these symptoms. This medicine may increase blood sugar. Ask your healthcare provider if changes in diet or medicines are needed if you have diabetes. This medicine may cause a decrease in vitamin B12. You should make sure that you get enough vitamin B12 while you are taking this medicine. Discuss the foods you eat and the vitamins you take with your health care professional. What side effects may I notice from receiving this medicine? Side   effects that you should report to your doctor or health care professional as soon as possible:  allergic reactions like skin rash, itching or hives, swelling of the face, lips, or tongue  fast, slow, or irregular heartbeat  right upper belly pain  severe stomach pain  signs  and symptoms of high blood sugar such as being more thirsty or hungry or having to urinate more than normal. You may also feel very tired or have blurry vision.  signs and symptoms of low blood sugar such as feeling anxious; confusion; dizziness; increased hunger; unusually weak or tired; increased sweating; shakiness; cold, clammy skin; irritable; headache; blurred vision; fast heartbeat; loss of consciousness  unusually weak or tired Side effects that usually do not require medical attention (report to your doctor or health care professional if they continue or are bothersome):  diarrhea  dizziness  gas  headache  nausea, vomiting  pain, redness, or irritation at site where injected  upset stomach This list may not describe all possible side effects. Call your doctor for medical advice about side effects. You may report side effects to FDA at 1-800-FDA-1088. Where should I keep my medicine? Keep out of the reach of children. Store in a refrigerator between 2 and 8 degrees C (36 and 46 degrees F). Protect from light. Allow to come to room temperature naturally. Do not use artificial heat. If protected from light, the injection may be stored at room temperature between 20 and 30 degrees C (70 and 86 degrees F) for 14 days. After the initial use, throw away any unused portion of a multiple dose vial after 14 days. Throw away unused portions of the ampules after use. NOTE: This sheet is a summary. It may not cover all possible information. If you have questions about this medicine, talk to your doctor, pharmacist, or health care provider.  2021 Elsevier/Gold Standard (2018-09-13 13:33:09)  

## 2020-05-26 NOTE — Progress Notes (Signed)
  [place] OFFICE PROGRESS NOTE   Diagnosis: Carcinoid tumor  INTERVAL HISTORY:   Michael Good returns as scheduled.  He feels well.  He is working.  He reports early satiety.  No nausea.  He has occasional diarrhea.  No mouth sores.  He continues everolimus.  Objective:  Vital signs in last 24 hours:  Blood pressure (!) 160/91, pulse 85, temperature 97.6 F (36.4 C), temperature source Tympanic, resp. rate 20, height _0  (1.702 m), weight 198 lb 1.6 oz (89.9 kg), SpO2 98 %.    HEENT: No thrush or ulcers Resp: Lungs clear bilaterally Cardio: Regular rate and rhythm GI: No hepatosplenomegaly, nontender Vascular: No leg edema Skin: Small ecchymoses at the dorsum of the hands and lower arms  Lab Results:  Lab Results  Component Value Date   WBC 3.4 (L) 05/26/2020   HGB 12.3 (L) 05/26/2020   HCT 33.6 (L) 05/26/2020   MCV 84.0 05/26/2020   PLT 87 (L) 05/26/2020   NEUTROABS 2.0 05/26/2020    CMP  Lab Results  Component Value Date   NA 136 04/21/2020   K 3.8 04/21/2020   CL 104 04/21/2020   CO2 24 04/21/2020   GLUCOSE 283 (H) 04/21/2020   BUN 17 04/21/2020   CREATININE 1.32 (H) 04/21/2020   CALCIUM 8.5 (L) 04/21/2020   PROT 7.6 04/21/2020   ALBUMIN 4.3 04/21/2020   AST 47 (H) 04/21/2020   ALT 57 (H) 04/21/2020   ALKPHOS 109 04/21/2020   BILITOT 1.7 (H) 04/21/2020   GFRNONAA 59 (L) 04/21/2020     Medications: I have reviewed the patient's current medications.   Assessment/Plan:  1. Metastatic carcinoid tumor ? Left lower lobectomy February 2004 at [facility], 2.2 cm typical carcinoid tumor ? Laparoscopic cholecystectomy November 2014, imaging revealed a 4.4 cm segment 8 liver lesion ? 03/18/2013-central hepatectomy and drainage of intra-abdominal abscess,- low-grade neuroendocrine carcinoma, tumor appeared totally excised,Ki-67 less than 5% ? CT's 05/09/2014, 08/16/2016, 09/19/2017-no evidence of recurrent disease ? CTs 914 2020-3 new liver  lesions ? 12/26/2018 PET dotatate scan-at least 20 liver lesions and extensive bone metastases, hypermetabolic pancreas tail lesion 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 ? 01/16/2019-biopsy of the left hepatic lobe lesion-metastatic neuroendocrine carcinoma,Ki-67 10%- interpretation limited due to scant remaining tumor may not be representative of the overall lesion, mismatch repair protein staining intact ? 02/27/2019-Sandostatin 30 mg every 4 weeks ? 09/18/2019- Netspot PET-increased activity in the pancreas tail and liver and bone metastases ? Late August 2021-everolimus, 7.5 mg daily, placed on hold September 2021 due to acute hyperglycemia/new onset diabetes ? 01/22/2020-everolimus resumed 2. Diabetes 3. ERCP removal of bile duct stones March 2015, March 2018 4. Sleep apnea 5. Hypertension 6. COVID-19 infection September 2021 7. Cholecystectomy November 2014 8. Umbilical hernia repair 1/61/0960 9. Erectile dysfunction 10. Chronic thrombocytopenia   Disposition: Michael Good appears unchanged.  He continues everolimus.  He has stable mild thrombocytopenia.  He continues monthly Sandostatin.  He will return for Sandostatin in 4 weeks in 8 weeks.  He will be scheduled for an office visit in 8 weeks.  We will plan a restaging Netspot after the 8-week office visit.  He will call for increased bruising or bleeding.  Betsy Coder, MD  05/26/2020  3:26 PM

## 2020-05-26 NOTE — Progress Notes (Signed)
Met with patient at registration to obtain income for grant.  Patient approved for one-time $1000 [employer] grant to assist with personal expenses while going through treatment.  Discussed in detail expenses and how they are covered. He has a copy of the approval letter and expense sheet along with the Outpatient pharmacy information. He received a gift card today from grant and in the future advised him to let me know if he would like to receive 2 based on distance. He verbalized understanding.  He has my card for any additional financial questions or concerns.

## 2020-05-27 ENCOUNTER — Telehealth: Payer: Self-pay | Admitting: Oncology

## 2020-05-27 LAB — CHROMOGRANIN A: Chromogranin A (ng/mL): 3854 ng/mL — ABNORMAL HIGH (ref 0.0–101.8)

## 2020-05-27 NOTE — Telephone Encounter (Signed)
Scheduled appt per 3/29 los - unable to reach pt - left message for patient with appt date and time

## 2020-05-28 ENCOUNTER — Other Ambulatory Visit (HOSPITAL_COMMUNITY): Payer: Self-pay

## 2020-05-29 ENCOUNTER — Telehealth: Payer: Self-pay | Admitting: *Deleted

## 2020-05-29 DIAGNOSIS — C7A090 Malignant carcinoid tumor of the bronchus and lung: Secondary | ICD-10-CM

## 2020-05-29 NOTE — Telephone Encounter (Signed)
-----   Message from Michael Pier, MD sent at 05/29/2020  7:07 AM EDT ----- Please call patient, chromogranin is slightly higher, continue current treatment, f/u as scheduled, add chromogranin to lab with 8 week visit, plan to schedule netspot after 8 week visit

## 2020-05-29 NOTE — Telephone Encounter (Signed)
Notified of elevated chromogranin level. Per MD will recheck at next OV. Patient understands and agrees.

## 2020-06-04 ENCOUNTER — Other Ambulatory Visit (HOSPITAL_COMMUNITY): Payer: Self-pay

## 2020-06-08 ENCOUNTER — Other Ambulatory Visit (HOSPITAL_COMMUNITY): Payer: Self-pay

## 2020-06-08 ENCOUNTER — Other Ambulatory Visit: Payer: Self-pay | Admitting: Oncology

## 2020-06-08 DIAGNOSIS — C7A090 Malignant carcinoid tumor of the bronchus and lung: Secondary | ICD-10-CM

## 2020-06-08 MED ORDER — EVEROLIMUS 7.5 MG PO TABS
ORAL_TABLET | Freq: Every day | ORAL | 0 refills | Status: DC
  Filled 2020-06-08: qty 30, 30d supply, fill #0
  Filled 2020-06-08: qty 30, fill #0

## 2020-06-11 ENCOUNTER — Other Ambulatory Visit (HOSPITAL_COMMUNITY): Payer: Self-pay

## 2020-06-23 ENCOUNTER — Inpatient Hospital Stay: Payer: Medicare Other

## 2020-06-23 ENCOUNTER — Other Ambulatory Visit: Payer: Medicare Other

## 2020-06-23 ENCOUNTER — Other Ambulatory Visit: Payer: Self-pay

## 2020-06-23 ENCOUNTER — Ambulatory Visit: Payer: Medicare Other

## 2020-06-23 ENCOUNTER — Inpatient Hospital Stay: Payer: Medicare Other | Attending: Oncology

## 2020-06-23 VITALS — BP 142/72 | HR 58 | Temp 98.2°F | Resp 17

## 2020-06-23 DIAGNOSIS — C7B02 Secondary carcinoid tumors of liver: Secondary | ICD-10-CM | POA: Diagnosis not present

## 2020-06-23 DIAGNOSIS — C7A090 Malignant carcinoid tumor of the bronchus and lung: Secondary | ICD-10-CM

## 2020-06-23 DIAGNOSIS — C7B03 Secondary carcinoid tumors of bone: Secondary | ICD-10-CM | POA: Insufficient documentation

## 2020-06-23 LAB — CBC WITH DIFFERENTIAL (CANCER CENTER ONLY)
Abs Immature Granulocytes: 0.01 10*3/uL (ref 0.00–0.07)
Basophils Absolute: 0 10*3/uL (ref 0.0–0.1)
Basophils Relative: 0 %
Eosinophils Absolute: 0.1 10*3/uL (ref 0.0–0.5)
Eosinophils Relative: 3 %
HCT: 33 % — ABNORMAL LOW (ref 39.0–52.0)
Hemoglobin: 12 g/dL — ABNORMAL LOW (ref 13.0–17.0)
Immature Granulocytes: 0 %
Lymphocytes Relative: 32 %
Lymphs Abs: 1.1 10*3/uL (ref 0.7–4.0)
MCH: 30.3 pg (ref 26.0–34.0)
MCHC: 36.4 g/dL — ABNORMAL HIGH (ref 30.0–36.0)
MCV: 83.3 fL (ref 80.0–100.0)
Monocytes Absolute: 0.2 10*3/uL (ref 0.1–1.0)
Monocytes Relative: 6 %
Neutro Abs: 2 10*3/uL (ref 1.7–7.7)
Neutrophils Relative %: 59 %
Platelet Count: 70 10*3/uL — ABNORMAL LOW (ref 150–400)
RBC: 3.96 MIL/uL — ABNORMAL LOW (ref 4.22–5.81)
RDW: 13.2 % (ref 11.5–15.5)
WBC Count: 3.4 10*3/uL — ABNORMAL LOW (ref 4.0–10.5)
nRBC: 0 % (ref 0.0–0.2)

## 2020-06-23 LAB — CMP (CANCER CENTER ONLY)
ALT: 32 U/L (ref 0–44)
AST: 31 U/L (ref 15–41)
Albumin: 3.9 g/dL (ref 3.5–5.0)
Alkaline Phosphatase: 96 U/L (ref 38–126)
Anion gap: 11 (ref 5–15)
BUN: 15 mg/dL (ref 8–23)
CO2: 27 mmol/L (ref 22–32)
Calcium: 8.3 mg/dL — ABNORMAL LOW (ref 8.9–10.3)
Chloride: 101 mmol/L (ref 98–111)
Creatinine: 1.48 mg/dL — ABNORMAL HIGH (ref 0.61–1.24)
GFR, Estimated: 52 mL/min — ABNORMAL LOW (ref >60)
Glucose, Bld: 276 mg/dL — ABNORMAL HIGH (ref 70–99)
Potassium: 4 mmol/L (ref 3.5–5.1)
Sodium: 139 mmol/L (ref 135–145)
Total Bilirubin: 1.6 mg/dL — ABNORMAL HIGH (ref 0.3–1.2)
Total Protein: 7 g/dL (ref 6.5–8.1)

## 2020-06-23 MED ORDER — OCTREOTIDE ACETATE 30 MG IM KIT
30.0000 mg | PACK | Freq: Once | INTRAMUSCULAR | Status: AC
  Administered 2020-06-23: 30 mg via INTRAMUSCULAR

## 2020-06-23 NOTE — Patient Instructions (Signed)
Octreotide injection solution What is this medicine? OCTREOTIDE (ok TREE oh tide) is used to reduce blood levels of growth hormone in patients with a condition called acromegaly. This medicine also reduces flushing and watery diarrhea caused by certain types of cancer. This medicine may be used for other purposes; ask your health care provider or pharmacist if you have questions. COMMON BRAND NAME(S): Bynfezia, Sandostatin What should I tell my health care provider before I take this medicine? They need to know if you have any of these conditions:  diabetes  gallbladder disease  kidney disease  liver disease  thyroid disease  an unusual or allergic reaction to octreotide, other medicines, foods, dyes, or preservatives  pregnant or trying to get pregnant  breast-feeding How should I use this medicine? This medicine is for injection under the skin or into a vein (only in emergency situations). It is usually given by a health care professional in a hospital or clinic setting. If you get this medicine at home, you will be taught how to prepare and give this medicine. Allow the injection solution to come to room temperature before use. Do not warm it artificially. Use exactly as directed. Take your medicine at regular intervals. Do not take your medicine more often than directed. It is important that you put your used needles and syringes in a special sharps container. Do not put them in a trash can. If you do not have a sharps container, call your pharmacist or healthcare provider to get one. Talk to your pediatrician regarding the use of this medicine in children. Special care may be needed. Overdosage: If you think you have taken too much of this medicine contact a poison control center or emergency room at once. NOTE: This medicine is only for you. Do not share this medicine with others. What if I miss a dose? If you miss a dose, take it as soon as you can. If it is almost time for your  next dose, take only that dose. Do not take double or extra doses. What may interact with this medicine?  bromocriptine  certain medicines for blood pressure, heart disease, irregular heartbeat  cyclosporine  diuretics  medicines for diabetes, including insulin  quinidine This list may not describe all possible interactions. Give your health care provider a list of all the medicines, herbs, non-prescription drugs, or dietary supplements you use. Also tell them if you smoke, drink alcohol, or use illegal drugs. Some items may interact with your medicine. What should I watch for while using this medicine? Visit your doctor or health care professional for regular checks on your progress. To help reduce irritation at the injection site, use a different site for each injection and make sure the solution is at room temperature before use. This medicine may cause decreases in blood sugar. Signs of low blood sugar include chills, cool, pale skin or cold sweats, drowsiness, extreme hunger, fast heartbeat, headache, nausea, nervousness or anxiety, shakiness, trembling, unsteadiness, tiredness, or weakness. Contact your doctor or health care professional right away if you experience any of these symptoms. This medicine may increase blood sugar. Ask your healthcare provider if changes in diet or medicines are needed if you have diabetes. This medicine may cause a decrease in vitamin B12. You should make sure that you get enough vitamin B12 while you are taking this medicine. Discuss the foods you eat and the vitamins you take with your health care professional. What side effects may I notice from receiving this medicine? Side   effects that you should report to your doctor or health care professional as soon as possible:  allergic reactions like skin rash, itching or hives, swelling of the face, lips, or tongue  fast, slow, or irregular heartbeat  right upper belly pain  severe stomach pain  signs  and symptoms of high blood sugar such as being more thirsty or hungry or having to urinate more than normal. You may also feel very tired or have blurry vision.  signs and symptoms of low blood sugar such as feeling anxious; confusion; dizziness; increased hunger; unusually weak or tired; increased sweating; shakiness; cold, clammy skin; irritable; headache; blurred vision; fast heartbeat; loss of consciousness  unusually weak or tired Side effects that usually do not require medical attention (report to your doctor or health care professional if they continue or are bothersome):  diarrhea  dizziness  gas  headache  nausea, vomiting  pain, redness, or irritation at site where injected  upset stomach This list may not describe all possible side effects. Call your doctor for medical advice about side effects. You may report side effects to FDA at 1-800-FDA-1088. Where should I keep my medicine? Keep out of the reach of children. Store in a refrigerator between 2 and 8 degrees C (36 and 46 degrees F). Protect from light. Allow to come to room temperature naturally. Do not use artificial heat. If protected from light, the injection may be stored at room temperature between 20 and 30 degrees C (70 and 86 degrees F) for 14 days. After the initial use, throw away any unused portion of a multiple dose vial after 14 days. Throw away unused portions of the ampules after use. NOTE: This sheet is a summary. It may not cover all possible information. If you have questions about this medicine, talk to your doctor, pharmacist, or health care provider.  2021 Elsevier/Gold Standard (2018-09-13 13:33:09)  

## 2020-06-24 ENCOUNTER — Telehealth: Payer: Self-pay

## 2020-06-24 NOTE — Telephone Encounter (Signed)
-----   Message from Ladell Pier, MD sent at 06/23/2020  6:31 PM EDT ----- Please call patient, platelet count remains moderately decreased, continue everolimus, follow-up as scheduled.  Call for bleeding

## 2020-06-24 NOTE — Telephone Encounter (Signed)
Pt called and  informed about decreased platelet count and  to follow up as scheduled.

## 2020-07-01 ENCOUNTER — Other Ambulatory Visit: Payer: Self-pay | Admitting: Oncology

## 2020-07-01 ENCOUNTER — Other Ambulatory Visit (HOSPITAL_COMMUNITY): Payer: Self-pay

## 2020-07-01 DIAGNOSIS — C7A090 Malignant carcinoid tumor of the bronchus and lung: Secondary | ICD-10-CM

## 2020-07-07 ENCOUNTER — Other Ambulatory Visit (HOSPITAL_COMMUNITY): Payer: Self-pay

## 2020-07-07 MED ORDER — EVEROLIMUS 7.5 MG PO TABS
ORAL_TABLET | Freq: Every day | ORAL | 0 refills | Status: DC
  Filled 2020-07-07: qty 30, 30d supply, fill #0

## 2020-07-08 ENCOUNTER — Other Ambulatory Visit (HOSPITAL_COMMUNITY): Payer: Self-pay

## 2020-07-21 ENCOUNTER — Ambulatory Visit: Payer: Medicare Other

## 2020-07-21 ENCOUNTER — Ambulatory Visit: Payer: Medicare Other | Admitting: Nurse Practitioner

## 2020-07-21 ENCOUNTER — Other Ambulatory Visit: Payer: Medicare Other

## 2020-07-23 ENCOUNTER — Other Ambulatory Visit: Payer: Self-pay

## 2020-07-23 ENCOUNTER — Ambulatory Visit: Payer: Medicare Other

## 2020-07-23 ENCOUNTER — Ambulatory Visit: Payer: Medicare Other | Admitting: Nurse Practitioner

## 2020-07-23 ENCOUNTER — Inpatient Hospital Stay: Payer: Medicare Other | Attending: Oncology

## 2020-07-23 ENCOUNTER — Inpatient Hospital Stay: Payer: Medicare Other

## 2020-07-23 ENCOUNTER — Inpatient Hospital Stay (HOSPITAL_BASED_OUTPATIENT_CLINIC_OR_DEPARTMENT_OTHER): Payer: Medicare Other | Admitting: Oncology

## 2020-07-23 ENCOUNTER — Other Ambulatory Visit: Payer: Medicare Other

## 2020-07-23 VITALS — BP 150/76 | HR 67 | Temp 97.8°F | Resp 18 | Ht 67.0 in | Wt 196.4 lb

## 2020-07-23 DIAGNOSIS — G4730 Sleep apnea, unspecified: Secondary | ICD-10-CM | POA: Diagnosis not present

## 2020-07-23 DIAGNOSIS — E119 Type 2 diabetes mellitus without complications: Secondary | ICD-10-CM | POA: Insufficient documentation

## 2020-07-23 DIAGNOSIS — I10 Essential (primary) hypertension: Secondary | ICD-10-CM | POA: Insufficient documentation

## 2020-07-23 DIAGNOSIS — C7B03 Secondary carcinoid tumors of bone: Secondary | ICD-10-CM | POA: Insufficient documentation

## 2020-07-23 DIAGNOSIS — Z79899 Other long term (current) drug therapy: Secondary | ICD-10-CM | POA: Diagnosis not present

## 2020-07-23 DIAGNOSIS — C7A090 Malignant carcinoid tumor of the bronchus and lung: Secondary | ICD-10-CM

## 2020-07-23 DIAGNOSIS — C7B02 Secondary carcinoid tumors of liver: Secondary | ICD-10-CM | POA: Insufficient documentation

## 2020-07-23 DIAGNOSIS — D696 Thrombocytopenia, unspecified: Secondary | ICD-10-CM | POA: Diagnosis not present

## 2020-07-23 LAB — CBC WITH DIFFERENTIAL (CANCER CENTER ONLY)
Abs Immature Granulocytes: 0.01 10*3/uL (ref 0.00–0.07)
Basophils Absolute: 0 10*3/uL (ref 0.0–0.1)
Basophils Relative: 1 %
Eosinophils Absolute: 0.1 10*3/uL (ref 0.0–0.5)
Eosinophils Relative: 2 %
HCT: 33.8 % — ABNORMAL LOW (ref 39.0–52.0)
Hemoglobin: 12.3 g/dL — ABNORMAL LOW (ref 13.0–17.0)
Immature Granulocytes: 0 %
Lymphocytes Relative: 32 %
Lymphs Abs: 1.2 10*3/uL (ref 0.7–4.0)
MCH: 30.5 pg (ref 26.0–34.0)
MCHC: 36.4 g/dL — ABNORMAL HIGH (ref 30.0–36.0)
MCV: 83.9 fL (ref 80.0–100.0)
Monocytes Absolute: 0.2 10*3/uL (ref 0.1–1.0)
Monocytes Relative: 7 %
Neutro Abs: 2.1 10*3/uL (ref 1.7–7.7)
Neutrophils Relative %: 58 %
Platelet Count: 81 10*3/uL — ABNORMAL LOW (ref 150–400)
RBC: 4.03 MIL/uL — ABNORMAL LOW (ref 4.22–5.81)
RDW: 13.3 % (ref 11.5–15.5)
WBC Count: 3.6 10*3/uL — ABNORMAL LOW (ref 4.0–10.5)
nRBC: 0 % (ref 0.0–0.2)

## 2020-07-23 LAB — CMP (CANCER CENTER ONLY)
ALT: 30 U/L (ref 0–44)
AST: 28 U/L (ref 15–41)
Albumin: 4.4 g/dL (ref 3.5–5.0)
Alkaline Phosphatase: 97 U/L (ref 38–126)
Anion gap: 7 (ref 5–15)
BUN: 17 mg/dL (ref 8–23)
CO2: 28 mmol/L (ref 22–32)
Calcium: 8.8 mg/dL — ABNORMAL LOW (ref 8.9–10.3)
Chloride: 104 mmol/L (ref 98–111)
Creatinine: 1.05 mg/dL (ref 0.61–1.24)
GFR, Estimated: >60 mL/min (ref >60)
Glucose, Bld: 245 mg/dL — ABNORMAL HIGH (ref 70–99)
Potassium: 4 mmol/L (ref 3.5–5.1)
Sodium: 139 mmol/L (ref 135–145)
Total Bilirubin: 1.3 mg/dL — ABNORMAL HIGH (ref 0.3–1.2)
Total Protein: 7.1 g/dL (ref 6.5–8.1)

## 2020-07-23 MED ORDER — OCTREOTIDE ACETATE 30 MG IM KIT
30.0000 mg | PACK | Freq: Once | INTRAMUSCULAR | Status: AC
Start: 2020-07-23 — End: 2020-07-23
  Administered 2020-07-23: 30 mg via INTRAMUSCULAR

## 2020-07-23 NOTE — Progress Notes (Signed)
Gas card given to patient today at direction of managed care

## 2020-07-23 NOTE — Patient Instructions (Signed)
Octreotide injection solution What is this medicine? OCTREOTIDE (ok TREE oh tide) is used to reduce blood levels of growth hormone in patients with a condition called acromegaly. This medicine also reduces flushing and watery diarrhea caused by certain types of cancer. This medicine may be used for other purposes; ask your health care provider or pharmacist if you have questions. COMMON BRAND NAME(S): Bynfezia, Sandostatin What should I tell my health care provider before I take this medicine? They need to know if you have any of these conditions:  diabetes  gallbladder disease  kidney disease  liver disease  thyroid disease  an unusual or allergic reaction to octreotide, other medicines, foods, dyes, or preservatives  pregnant or trying to get pregnant  breast-feeding How should I use this medicine? This medicine is for injection under the skin or into a vein (only in emergency situations). It is usually given by a health care professional in a hospital or clinic setting. If you get this medicine at home, you will be taught how to prepare and give this medicine. Allow the injection solution to come to room temperature before use. Do not warm it artificially. Use exactly as directed. Take your medicine at regular intervals. Do not take your medicine more often than directed. It is important that you put your used needles and syringes in a special sharps container. Do not put them in a trash can. If you do not have a sharps container, call your pharmacist or healthcare provider to get one. Talk to your pediatrician regarding the use of this medicine in children. Special care may be needed. Overdosage: If you think you have taken too much of this medicine contact a poison control center or emergency room at once. NOTE: This medicine is only for you. Do not share this medicine with others. What if I miss a dose? If you miss a dose, take it as soon as you can. If it is almost time for your  next dose, take only that dose. Do not take double or extra doses. What may interact with this medicine?  bromocriptine  certain medicines for blood pressure, heart disease, irregular heartbeat  cyclosporine  diuretics  medicines for diabetes, including insulin  quinidine This list may not describe all possible interactions. Give your health care provider a list of all the medicines, herbs, non-prescription drugs, or dietary supplements you use. Also tell them if you smoke, drink alcohol, or use illegal drugs. Some items may interact with your medicine. What should I watch for while using this medicine? Visit your doctor or health care professional for regular checks on your progress. To help reduce irritation at the injection site, use a different site for each injection and make sure the solution is at room temperature before use. This medicine may cause decreases in blood sugar. Signs of low blood sugar include chills, cool, pale skin or cold sweats, drowsiness, extreme hunger, fast heartbeat, headache, nausea, nervousness or anxiety, shakiness, trembling, unsteadiness, tiredness, or weakness. Contact your doctor or health care professional right away if you experience any of these symptoms. This medicine may increase blood sugar. Ask your healthcare provider if changes in diet or medicines are needed if you have diabetes. This medicine may cause a decrease in vitamin B12. You should make sure that you get enough vitamin B12 while you are taking this medicine. Discuss the foods you eat and the vitamins you take with your health care professional. What side effects may I notice from receiving this medicine? Side   effects that you should report to your doctor or health care professional as soon as possible:  allergic reactions like skin rash, itching or hives, swelling of the face, lips, or tongue  fast, slow, or irregular heartbeat  right upper belly pain  severe stomach pain  signs  and symptoms of high blood sugar such as being more thirsty or hungry or having to urinate more than normal. You may also feel very tired or have blurry vision.  signs and symptoms of low blood sugar such as feeling anxious; confusion; dizziness; increased hunger; unusually weak or tired; increased sweating; shakiness; cold, clammy skin; irritable; headache; blurred vision; fast heartbeat; loss of consciousness  unusually weak or tired Side effects that usually do not require medical attention (report to your doctor or health care professional if they continue or are bothersome):  diarrhea  dizziness  gas  headache  nausea, vomiting  pain, redness, or irritation at site where injected  upset stomach This list may not describe all possible side effects. Call your doctor for medical advice about side effects. You may report side effects to FDA at 1-800-FDA-1088. Where should I keep my medicine? Keep out of the reach of children. Store in a refrigerator between 2 and 8 degrees C (36 and 46 degrees F). Protect from light. Allow to come to room temperature naturally. Do not use artificial heat. If protected from light, the injection may be stored at room temperature between 20 and 30 degrees C (70 and 86 degrees F) for 14 days. After the initial use, throw away any unused portion of a multiple dose vial after 14 days. Throw away unused portions of the ampules after use. NOTE: This sheet is a summary. It may not cover all possible information. If you have questions about this medicine, talk to your doctor, pharmacist, or health care provider.  2021 Elsevier/Gold Standard (2018-09-13 13:33:09)  

## 2020-07-23 NOTE — Progress Notes (Signed)
  [place] OFFICE PROGRESS NOTE   Diagnosis: Carcinoid tumor  INTERVAL HISTORY:   [name] returns as scheduled.  He continues everolimus and monthly Sandostatin.  He has occasional once daily diarrhea, but no consistent diarrhea.  He is working.  No pain.  Objective:  Vital signs in last 24 hours:  Blood pressure (!) 150/76, pulse 67, temperature 97.8 F (36.6 C), temperature source Oral, resp. rate 18, height $RemoveBe'5\' 7"'tLioXnnJb$  (1.702 m), weight 196 lb 6.4 oz (89.1 kg), SpO2 100 %.    HEENT: No thrush or ulcers Resp: Lungs clear bilaterally  Cardio: Regular rate and rhythm GI: No mass, nontender, no hepatosplenomegaly Vascular: Trace edema at the left greater than right lower leg     Lab Results:  Lab Results  Component Value Date   WBC 3.6 (L) 07/23/2020   HGB 12.3 (L) 07/23/2020   HCT 33.8 (L) 07/23/2020   MCV 83.9 07/23/2020   PLT 81 (L) 07/23/2020   NEUTROABS 2.1 07/23/2020    CMP  Lab Results  Component Value Date   NA 139 06/23/2020   K 4.0 06/23/2020   CL 101 06/23/2020   CO2 27 06/23/2020   GLUCOSE 276 (H) 06/23/2020   BUN 15 06/23/2020   CREATININE 1.48 (H) 06/23/2020   CALCIUM 8.3 (L) 06/23/2020   PROT 7.0 06/23/2020   ALBUMIN 3.9 06/23/2020   AST 31 06/23/2020   ALT 32 06/23/2020   ALKPHOS 96 06/23/2020   BILITOT 1.6 (H) 06/23/2020   GFRNONAA 52 (L) 06/23/2020     Medications: I have reviewed the patient's current medications.   Assessment/Plan:  1. Metastatic carcinoid tumor ? Left lower lobectomy February 2004 at [facility], 2.2 cm typical carcinoid tumor ? Laparoscopic cholecystectomy November 2014, imaging revealed a 4.4 cm segment 8 liver lesion ? 03/18/2013-central hepatectomy and drainage of intra-abdominal abscess,- low-grade neuroendocrine carcinoma, tumor appeared totally excised,Ki-67 less than 5% ? CT's 05/09/2014, 08/16/2016, 09/19/2017-no evidence of recurrent disease ? CTs 914 2020-3 new liver lesions ? 12/26/2018 PET  dotatate scan-at least 20 liver lesions and extensive bone metastases, hypermetabolic pancreas tail lesion 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 ? 01/16/2019-biopsy of the left hepatic lobe lesion-metastatic neuroendocrine carcinoma,Ki-67 10%- interpretation limited due to scant remaining tumor may not be representative of the overall lesion, mismatch repair protein staining intact ? 02/27/2019-Sandostatin 30 mg every 4 weeks ? 09/18/2019- Netspot PET-increased activity in the pancreas tail and liver and bone metastases ? Late August 2021-everolimus, 7.5 mg daily, placed on hold September 2021 due to acute hyperglycemia/new onset diabetes ? 01/22/2020-everolimus resumed 2. Diabetes 3. ERCP removal of bile duct stones March 2015, March 2018 4. Sleep apnea 5. Hypertension 6. COVID-19 infection September 2021 7. Cholecystectomy November 2014 8. Umbilical hernia repair 4/84/7207 9. Erectile dysfunction 10. Chronic thrombocytopenia  Disposition: [name] appears stable.  He continues everolimus and monthly Sandostatin.  We will follow-up on the chromogranin a level from today.  He will be scheduled for a restaging dotatate scan and office visit in July.  He will return for Sandostatin and a lab visit in 1 month.  He has chronic stable thrombocytopenia.  Betsy Coder, MD  07/23/2020  3:54 PM

## 2020-07-24 ENCOUNTER — Encounter: Payer: Self-pay | Admitting: *Deleted

## 2020-07-24 NOTE — Progress Notes (Signed)
Faxed referral order, demographics, insurance card, chart information to [facility] [phone].

## 2020-07-28 LAB — CHROMOGRANIN A: Chromogranin A (ng/mL): 2686 ng/mL — ABNORMAL HIGH (ref 0.0–101.8)

## 2020-07-30 ENCOUNTER — Ambulatory Visit: Payer: Medicare Other

## 2020-07-30 ENCOUNTER — Ambulatory Visit: Payer: Medicare Other | Admitting: Nurse Practitioner

## 2020-07-30 ENCOUNTER — Other Ambulatory Visit: Payer: Medicare Other

## 2020-07-31 ENCOUNTER — Other Ambulatory Visit (HOSPITAL_COMMUNITY): Payer: Self-pay

## 2020-07-31 ENCOUNTER — Other Ambulatory Visit: Payer: Self-pay | Admitting: Oncology

## 2020-07-31 DIAGNOSIS — C7A090 Malignant carcinoid tumor of the bronchus and lung: Secondary | ICD-10-CM

## 2020-07-31 MED ORDER — EVEROLIMUS 7.5 MG PO TABS
ORAL_TABLET | Freq: Every day | ORAL | 1 refills | Status: DC
  Filled 2020-07-31: qty 30, 30d supply, fill #0
  Filled 2020-09-01: qty 30, 30d supply, fill #1

## 2020-08-03 ENCOUNTER — Other Ambulatory Visit (HOSPITAL_COMMUNITY): Payer: Self-pay

## 2020-08-13 ENCOUNTER — Encounter: Payer: Self-pay | Admitting: Oncology

## 2020-08-13 NOTE — Progress Notes (Signed)
Patient's spouse called in reference to one of the gift cards not working. Advised to return at [place] location at next visit.  Placed replacement card in outgoing mail.  They have my card for any additional financial questions or concerns.  Evelena Leyden is advocate for [place] location should anyone need to reach out.

## 2020-08-24 ENCOUNTER — Inpatient Hospital Stay: Payer: Medicare Other

## 2020-08-25 ENCOUNTER — Inpatient Hospital Stay: Payer: Medicare Other | Attending: Oncology

## 2020-08-25 ENCOUNTER — Encounter: Payer: Self-pay | Admitting: *Deleted

## 2020-08-25 ENCOUNTER — Inpatient Hospital Stay: Payer: Medicare Other

## 2020-08-25 ENCOUNTER — Other Ambulatory Visit: Payer: Self-pay

## 2020-08-25 VITALS — BP 145/78 | HR 64 | Temp 98.1°F | Resp 18

## 2020-08-25 DIAGNOSIS — C7B03 Secondary carcinoid tumors of bone: Secondary | ICD-10-CM | POA: Insufficient documentation

## 2020-08-25 DIAGNOSIS — C7B02 Secondary carcinoid tumors of liver: Secondary | ICD-10-CM | POA: Diagnosis present

## 2020-08-25 DIAGNOSIS — C7A090 Malignant carcinoid tumor of the bronchus and lung: Secondary | ICD-10-CM | POA: Diagnosis not present

## 2020-08-25 LAB — CBC WITH DIFFERENTIAL (CANCER CENTER ONLY)
Abs Immature Granulocytes: 0.01 10*3/uL (ref 0.00–0.07)
Basophils Absolute: 0 10*3/uL (ref 0.0–0.1)
Basophils Relative: 1 %
Eosinophils Absolute: 0.1 10*3/uL (ref 0.0–0.5)
Eosinophils Relative: 3 %
HCT: 33.1 % — ABNORMAL LOW (ref 39.0–52.0)
Hemoglobin: 12.1 g/dL — ABNORMAL LOW (ref 13.0–17.0)
Immature Granulocytes: 0 %
Lymphocytes Relative: 34 %
Lymphs Abs: 1.2 10*3/uL (ref 0.7–4.0)
MCH: 30.4 pg (ref 26.0–34.0)
MCHC: 36.6 g/dL — ABNORMAL HIGH (ref 30.0–36.0)
MCV: 83.2 fL (ref 80.0–100.0)
Monocytes Absolute: 0.3 10*3/uL (ref 0.1–1.0)
Monocytes Relative: 9 %
Neutro Abs: 2 10*3/uL (ref 1.7–7.7)
Neutrophils Relative %: 53 %
Platelet Count: 84 10*3/uL — ABNORMAL LOW (ref 150–400)
RBC: 3.98 MIL/uL — ABNORMAL LOW (ref 4.22–5.81)
RDW: 13.5 % (ref 11.5–15.5)
WBC Count: 3.7 10*3/uL — ABNORMAL LOW (ref 4.0–10.5)
nRBC: 0 % (ref 0.0–0.2)

## 2020-08-25 LAB — CMP (CANCER CENTER ONLY)
ALT: 30 U/L (ref 0–44)
AST: 32 U/L (ref 15–41)
Albumin: 4.1 g/dL (ref 3.5–5.0)
Alkaline Phosphatase: 80 U/L (ref 38–126)
Anion gap: 8 (ref 5–15)
BUN: 17 mg/dL (ref 8–23)
CO2: 29 mmol/L (ref 22–32)
Calcium: 8.7 mg/dL — ABNORMAL LOW (ref 8.9–10.3)
Chloride: 101 mmol/L (ref 98–111)
Creatinine: 1.19 mg/dL (ref 0.61–1.24)
GFR, Estimated: >60 mL/min (ref >60)
Glucose, Bld: 242 mg/dL — ABNORMAL HIGH (ref 70–99)
Potassium: 3.6 mmol/L (ref 3.5–5.1)
Sodium: 138 mmol/L (ref 135–145)
Total Bilirubin: 1.3 mg/dL — ABNORMAL HIGH (ref 0.3–1.2)
Total Protein: 6.6 g/dL (ref 6.5–8.1)

## 2020-08-25 MED ORDER — OCTREOTIDE ACETATE 30 MG IM KIT
30.0000 mg | PACK | Freq: Once | INTRAMUSCULAR | Status: AC
  Administered 2020-08-25: 30 mg via INTRAMUSCULAR

## 2020-08-25 NOTE — Patient Instructions (Signed)
Octreotide injection solution What is this medicine? OCTREOTIDE (ok TREE oh tide) is used to reduce blood levels of growth hormone in patients with a condition called acromegaly. This medicine also reduces flushing and watery diarrhea caused by certain types of cancer. This medicine may be used for other purposes; ask your health care provider or pharmacist if you have questions. COMMON BRAND NAME(S): Bynfezia, Sandostatin What should I tell my health care provider before I take this medicine? They need to know if you have any of these conditions:  diabetes  gallbladder disease  kidney disease  liver disease  thyroid disease  an unusual or allergic reaction to octreotide, other medicines, foods, dyes, or preservatives  pregnant or trying to get pregnant  breast-feeding How should I use this medicine? This medicine is for injection under the skin or into a vein (only in emergency situations). It is usually given by a health care professional in a hospital or clinic setting. If you get this medicine at home, you will be taught how to prepare and give this medicine. Allow the injection solution to come to room temperature before use. Do not warm it artificially. Use exactly as directed. Take your medicine at regular intervals. Do not take your medicine more often than directed. It is important that you put your used needles and syringes in a special sharps container. Do not put them in a trash can. If you do not have a sharps container, call your pharmacist or healthcare provider to get one. Talk to your pediatrician regarding the use of this medicine in children. Special care may be needed. Overdosage: If you think you have taken too much of this medicine contact a poison control center or emergency room at once. NOTE: This medicine is only for you. Do not share this medicine with others. What if I miss a dose? If you miss a dose, take it as soon as you can. If it is almost time for your  next dose, take only that dose. Do not take double or extra doses. What may interact with this medicine?  bromocriptine  certain medicines for blood pressure, heart disease, irregular heartbeat  cyclosporine  diuretics  medicines for diabetes, including insulin  quinidine This list may not describe all possible interactions. Give your health care provider a list of all the medicines, herbs, non-prescription drugs, or dietary supplements you use. Also tell them if you smoke, drink alcohol, or use illegal drugs. Some items may interact with your medicine. What should I watch for while using this medicine? Visit your doctor or health care professional for regular checks on your progress. To help reduce irritation at the injection site, use a different site for each injection and make sure the solution is at room temperature before use. This medicine may cause decreases in blood sugar. Signs of low blood sugar include chills, cool, pale skin or cold sweats, drowsiness, extreme hunger, fast heartbeat, headache, nausea, nervousness or anxiety, shakiness, trembling, unsteadiness, tiredness, or weakness. Contact your doctor or health care professional right away if you experience any of these symptoms. This medicine may increase blood sugar. Ask your healthcare provider if changes in diet or medicines are needed if you have diabetes. This medicine may cause a decrease in vitamin B12. You should make sure that you get enough vitamin B12 while you are taking this medicine. Discuss the foods you eat and the vitamins you take with your health care professional. What side effects may I notice from receiving this medicine? Side   effects that you should report to your doctor or health care professional as soon as possible:  allergic reactions like skin rash, itching or hives, swelling of the face, lips, or tongue  fast, slow, or irregular heartbeat  right upper belly pain  severe stomach pain  signs  and symptoms of high blood sugar such as being more thirsty or hungry or having to urinate more than normal. You may also feel very tired or have blurry vision.  signs and symptoms of low blood sugar such as feeling anxious; confusion; dizziness; increased hunger; unusually weak or tired; increased sweating; shakiness; cold, clammy skin; irritable; headache; blurred vision; fast heartbeat; loss of consciousness  unusually weak or tired Side effects that usually do not require medical attention (report to your doctor or health care professional if they continue or are bothersome):  diarrhea  dizziness  gas  headache  nausea, vomiting  pain, redness, or irritation at site where injected  upset stomach This list may not describe all possible side effects. Call your doctor for medical advice about side effects. You may report side effects to FDA at 1-800-FDA-1088. Where should I keep my medicine? Keep out of the reach of children. Store in a refrigerator between 2 and 8 degrees C (36 and 46 degrees F). Protect from light. Allow to come to room temperature naturally. Do not use artificial heat. If protected from light, the injection may be stored at room temperature between 20 and 30 degrees C (70 and 86 degrees F) for 14 days. After the initial use, throw away any unused portion of a multiple dose vial after 14 days. Throw away unused portions of the ampules after use. NOTE: This sheet is a summary. It may not cover all possible information. If you have questions about this medicine, talk to your doctor, pharmacist, or health care provider.  2021 Elsevier/Gold Standard (2018-09-13 13:33:09)  

## 2020-08-25 NOTE — Progress Notes (Signed)
Provided patient with #2 $25 gas cards. He returned card [phone] [id number] (CV [id number]) stating it had no money on it and did not wish to be "charged" for that one. Notified managed care.

## 2020-09-01 ENCOUNTER — Other Ambulatory Visit (HOSPITAL_COMMUNITY): Payer: Self-pay

## 2020-09-02 ENCOUNTER — Telehealth: Payer: Self-pay | Admitting: *Deleted

## 2020-09-02 NOTE — Telephone Encounter (Signed)
Informed of new patient appointment with Dr. Lovena Neighbours at [facility] for 09/07/20 at 3 pm. Also provided phone # for [facility].

## 2020-09-03 ENCOUNTER — Other Ambulatory Visit (HOSPITAL_COMMUNITY): Payer: Self-pay

## 2020-09-03 ENCOUNTER — Telehealth: Payer: Self-pay | Admitting: *Deleted

## 2020-09-03 NOTE — Telephone Encounter (Signed)
Wife called to inquire on status of scheduling NETSPOT. Informed her that we are waiting on PA to schedule with nuclear [facility]. Message sent to managed care to f/u on status of PA.

## 2020-09-07 ENCOUNTER — Telehealth: Payer: Self-pay | Admitting: *Deleted

## 2020-09-07 NOTE — Telephone Encounter (Signed)
Fax from [facility] that NETSPOT is authorized from 09/07/20 to 03/06/21 with #[id number]. Scheduled for 09/24/20 at 1130 with NPO 4 hours prior. Will need to hold Sandostatin until after scan. Patient notified and this date will not work for him. Provided him phone # for [facility] to change the appointment.

## 2020-09-08 NOTE — Telephone Encounter (Signed)
Patient moved his NETSPOT to 8/2. Scheduling message sent to move all his 7/25 appointments to after NETSPOT (same week).

## 2020-09-21 ENCOUNTER — Inpatient Hospital Stay: Payer: Medicare Other | Admitting: Oncology

## 2020-09-21 ENCOUNTER — Inpatient Hospital Stay: Payer: Medicare Other

## 2020-09-24 ENCOUNTER — Ambulatory Visit (HOSPITAL_COMMUNITY): Payer: Medicare Other

## 2020-09-29 ENCOUNTER — Other Ambulatory Visit: Payer: Self-pay | Admitting: Oncology

## 2020-09-29 ENCOUNTER — Inpatient Hospital Stay: Payer: Medicare Other | Attending: Oncology

## 2020-09-29 ENCOUNTER — Other Ambulatory Visit: Payer: Self-pay

## 2020-09-29 ENCOUNTER — Ambulatory Visit (HOSPITAL_COMMUNITY)
Admission: RE | Admit: 2020-09-29 | Discharge: 2020-09-29 | Disposition: A | Discharge status: Home / Self Care | Payer: Medicare Other | Source: Ambulatory Visit | Attending: Oncology | Admitting: Oncology

## 2020-09-29 ENCOUNTER — Other Ambulatory Visit (HOSPITAL_COMMUNITY): Payer: Self-pay

## 2020-09-29 VITALS — BP 149/82 | HR 67 | Temp 98.3°F | Resp 18

## 2020-09-29 DIAGNOSIS — G4730 Sleep apnea, unspecified: Secondary | ICD-10-CM | POA: Insufficient documentation

## 2020-09-29 DIAGNOSIS — D696 Thrombocytopenia, unspecified: Secondary | ICD-10-CM | POA: Diagnosis not present

## 2020-09-29 DIAGNOSIS — C7A090 Malignant carcinoid tumor of the bronchus and lung: Secondary | ICD-10-CM | POA: Insufficient documentation

## 2020-09-29 DIAGNOSIS — E119 Type 2 diabetes mellitus without complications: Secondary | ICD-10-CM | POA: Insufficient documentation

## 2020-09-29 DIAGNOSIS — I10 Essential (primary) hypertension: Secondary | ICD-10-CM | POA: Diagnosis not present

## 2020-09-29 DIAGNOSIS — Z79899 Other long term (current) drug therapy: Secondary | ICD-10-CM | POA: Insufficient documentation

## 2020-09-29 DIAGNOSIS — C7B03 Secondary carcinoid tumors of bone: Secondary | ICD-10-CM | POA: Insufficient documentation

## 2020-09-29 DIAGNOSIS — C7B02 Secondary carcinoid tumors of liver: Secondary | ICD-10-CM | POA: Insufficient documentation

## 2020-09-29 MED ORDER — EVEROLIMUS 7.5 MG PO TABS
ORAL_TABLET | Freq: Every day | ORAL | 1 refills | Status: DC
  Filled 2020-09-29: qty 30, 30d supply, fill #0
  Filled 2020-10-27: qty 30, 30d supply, fill #1

## 2020-09-29 MED ORDER — OCTREOTIDE ACETATE 30 MG IM KIT
30.0000 mg | PACK | Freq: Once | INTRAMUSCULAR | Status: AC
  Administered 2020-09-29: 30 mg via INTRAMUSCULAR

## 2020-09-29 MED ORDER — GALLIUM GA 68 DOTATATE IV KIT
4.8000 | PACK | Freq: Once | INTRAVENOUS | Status: AC | PRN
  Administered 2020-09-29: 4.83 via INTRAVENOUS

## 2020-09-29 NOTE — Patient Instructions (Signed)
Octreotide injection solution What is this medicine? OCTREOTIDE (ok TREE oh tide) is used to reduce blood levels of growth hormone in patients with a condition called acromegaly. This medicine also reduces flushing and watery diarrhea caused by certain types of cancer. This medicine may be used for other purposes; ask your health care provider or pharmacist if you have questions. COMMON BRAND NAME(S): Bynfezia, Sandostatin What should I tell my health care provider before I take this medicine? They need to know if you have any of these conditions:  diabetes  gallbladder disease  kidney disease  liver disease  thyroid disease  an unusual or allergic reaction to octreotide, other medicines, foods, dyes, or preservatives  pregnant or trying to get pregnant  breast-feeding How should I use this medicine? This medicine is for injection under the skin or into a vein (only in emergency situations). It is usually given by a health care professional in a hospital or clinic setting. If you get this medicine at home, you will be taught how to prepare and give this medicine. Allow the injection solution to come to room temperature before use. Do not warm it artificially. Use exactly as directed. Take your medicine at regular intervals. Do not take your medicine more often than directed. It is important that you put your used needles and syringes in a special sharps container. Do not put them in a trash can. If you do not have a sharps container, call your pharmacist or healthcare provider to get one. Talk to your pediatrician regarding the use of this medicine in children. Special care may be needed. Overdosage: If you think you have taken too much of this medicine contact a poison control center or emergency room at once. NOTE: This medicine is only for you. Do not share this medicine with others. What if I miss a dose? If you miss a dose, take it as soon as you can. If it is almost time for your  next dose, take only that dose. Do not take double or extra doses. What may interact with this medicine?  bromocriptine  certain medicines for blood pressure, heart disease, irregular heartbeat  cyclosporine  diuretics  medicines for diabetes, including insulin  quinidine This list may not describe all possible interactions. Give your health care provider a list of all the medicines, herbs, non-prescription drugs, or dietary supplements you use. Also tell them if you smoke, drink alcohol, or use illegal drugs. Some items may interact with your medicine. What should I watch for while using this medicine? Visit your doctor or health care professional for regular checks on your progress. To help reduce irritation at the injection site, use a different site for each injection and make sure the solution is at room temperature before use. This medicine may cause decreases in blood sugar. Signs of low blood sugar include chills, cool, pale skin or cold sweats, drowsiness, extreme hunger, fast heartbeat, headache, nausea, nervousness or anxiety, shakiness, trembling, unsteadiness, tiredness, or weakness. Contact your doctor or health care professional right away if you experience any of these symptoms. This medicine may increase blood sugar. Ask your healthcare provider if changes in diet or medicines are needed if you have diabetes. This medicine may cause a decrease in vitamin B12. You should make sure that you get enough vitamin B12 while you are taking this medicine. Discuss the foods you eat and the vitamins you take with your health care professional. What side effects may I notice from receiving this medicine? Side   effects that you should report to your doctor or health care professional as soon as possible:  allergic reactions like skin rash, itching or hives, swelling of the face, lips, or tongue  fast, slow, or irregular heartbeat  right upper belly pain  severe stomach pain  signs  and symptoms of high blood sugar such as being more thirsty or hungry or having to urinate more than normal. You may also feel very tired or have blurry vision.  signs and symptoms of low blood sugar such as feeling anxious; confusion; dizziness; increased hunger; unusually weak or tired; increased sweating; shakiness; cold, clammy skin; irritable; headache; blurred vision; fast heartbeat; loss of consciousness  unusually weak or tired Side effects that usually do not require medical attention (report to your doctor or health care professional if they continue or are bothersome):  diarrhea  dizziness  gas  headache  nausea, vomiting  pain, redness, or irritation at site where injected  upset stomach This list may not describe all possible side effects. Call your doctor for medical advice about side effects. You may report side effects to FDA at 1-800-FDA-1088. Where should I keep my medicine? Keep out of the reach of children. Store in a refrigerator between 2 and 8 degrees C (36 and 46 degrees F). Protect from light. Allow to come to room temperature naturally. Do not use artificial heat. If protected from light, the injection may be stored at room temperature between 20 and 30 degrees C (70 and 86 degrees F) for 14 days. After the initial use, throw away any unused portion of a multiple dose vial after 14 days. Throw away unused portions of the ampules after use. NOTE: This sheet is a summary. It may not cover all possible information. If you have questions about this medicine, talk to your doctor, pharmacist, or health care provider.  2021 Elsevier/Gold Standard (2018-09-13 13:33:09)  

## 2020-09-30 ENCOUNTER — Other Ambulatory Visit (HOSPITAL_COMMUNITY): Payer: Self-pay

## 2020-10-01 ENCOUNTER — Other Ambulatory Visit (HOSPITAL_COMMUNITY): Payer: Self-pay

## 2020-10-02 ENCOUNTER — Telehealth: Payer: Self-pay | Admitting: *Deleted

## 2020-10-02 NOTE — Telephone Encounter (Signed)
Notified of PET scan results and was very relieved and appreciative. F/U as scheduled.

## 2020-10-02 NOTE — Telephone Encounter (Signed)
-----   Message from Ladell Pier, MD sent at 09/30/2020  1:13 PM EDT ----- Please call patient, PET shows no evidence of disease progression, stable liver and bone lesions, will be scheduled

## 2020-10-05 ENCOUNTER — Ambulatory Visit: Payer: Medicare Other

## 2020-10-05 ENCOUNTER — Ambulatory Visit: Payer: Medicare Other | Admitting: Oncology

## 2020-10-05 ENCOUNTER — Other Ambulatory Visit: Payer: Medicare Other

## 2020-10-20 ENCOUNTER — Inpatient Hospital Stay: Payer: Medicare Other

## 2020-10-20 ENCOUNTER — Encounter: Payer: Self-pay | Admitting: *Deleted

## 2020-10-20 ENCOUNTER — Other Ambulatory Visit: Payer: Self-pay

## 2020-10-20 ENCOUNTER — Inpatient Hospital Stay (HOSPITAL_BASED_OUTPATIENT_CLINIC_OR_DEPARTMENT_OTHER): Payer: Medicare Other | Admitting: Oncology

## 2020-10-20 VITALS — BP 142/75 | HR 68 | Temp 98.0°F | Resp 20 | Ht 67.0 in | Wt 193.6 lb

## 2020-10-20 DIAGNOSIS — C7A090 Malignant carcinoid tumor of the bronchus and lung: Secondary | ICD-10-CM | POA: Diagnosis not present

## 2020-10-20 LAB — CMP (CANCER CENTER ONLY)
ALT: 26 U/L (ref 0–44)
AST: 25 U/L (ref 15–41)
Albumin: 4.1 g/dL (ref 3.5–5.0)
Alkaline Phosphatase: 88 U/L (ref 38–126)
Anion gap: 8 (ref 5–15)
BUN: 16 mg/dL (ref 8–23)
CO2: 28 mmol/L (ref 22–32)
Calcium: 8.6 mg/dL — ABNORMAL LOW (ref 8.9–10.3)
Chloride: 102 mmol/L (ref 98–111)
Creatinine: 1.15 mg/dL (ref 0.61–1.24)
GFR, Estimated: >60 mL/min (ref >60)
Glucose, Bld: 264 mg/dL — ABNORMAL HIGH (ref 70–99)
Potassium: 3.6 mmol/L (ref 3.5–5.1)
Sodium: 138 mmol/L (ref 135–145)
Total Bilirubin: 1.5 mg/dL — ABNORMAL HIGH (ref 0.3–1.2)
Total Protein: 7.1 g/dL (ref 6.5–8.1)

## 2020-10-20 LAB — CBC WITH DIFFERENTIAL (CANCER CENTER ONLY)
Abs Immature Granulocytes: 0.01 10*3/uL (ref 0.00–0.07)
Basophils Absolute: 0 10*3/uL (ref 0.0–0.1)
Basophils Relative: 0 %
Eosinophils Absolute: 0.1 10*3/uL (ref 0.0–0.5)
Eosinophils Relative: 2 %
HCT: 34.5 % — ABNORMAL LOW (ref 39.0–52.0)
Hemoglobin: 12.4 g/dL — ABNORMAL LOW (ref 13.0–17.0)
Immature Granulocytes: 0 %
Lymphocytes Relative: 27 %
Lymphs Abs: 0.9 10*3/uL (ref 0.7–4.0)
MCH: 30.1 pg (ref 26.0–34.0)
MCHC: 35.9 g/dL (ref 30.0–36.0)
MCV: 83.7 fL (ref 80.0–100.0)
Monocytes Absolute: 0.2 10*3/uL (ref 0.1–1.0)
Monocytes Relative: 6 %
Neutro Abs: 2.1 10*3/uL (ref 1.7–7.7)
Neutrophils Relative %: 65 %
Platelet Count: 73 10*3/uL — ABNORMAL LOW (ref 150–400)
RBC: 4.12 MIL/uL — ABNORMAL LOW (ref 4.22–5.81)
RDW: 13.6 % (ref 11.5–15.5)
WBC Count: 3.3 10*3/uL — ABNORMAL LOW (ref 4.0–10.5)
nRBC: 0 % (ref 0.0–0.2)

## 2020-10-20 NOTE — Progress Notes (Signed)
[place] OFFICE PROGRESS NOTE   Diagnosis: Carcinoid tumor  INTERVAL HISTORY:   Michael Good returns as scheduled.  He continues everolimus.  No diarrhea, flushing, or pain.  He saw urology for evaluation of erectile dysfunction.  He is now using injection therapy with partial improvement. He has developed tenderness and skin thickening at the lower legs just above the ankles.  This is been present for several months.  He reports anorexia, but he is eating.  Objective:  Vital signs in last 24 hours:  Blood pressure (!) 142/75, pulse 68, temperature 98 F (36.7 C), temperature source Oral, resp. rate 20, height 5' 7"  (1.702 m), weight 193 lb 9.6 oz (87.8 kg), SpO2 100 %.    HEENT: No thrush or ulcers Resp: Lungs clear bilaterally Cardio: Regular rate and rhythm GI: No mass, no hepatosplenomegaly, nontender Vascular: Trace lower leg edema bilaterally  Skin: Mild hyperpigmentation and slightly raised rash at the low leg bilaterally.  The area of thickening is just above the ankles  Lab Results:  Lab Results  Component Value Date   WBC 3.3 (L) 10/20/2020   HGB 12.4 (L) 10/20/2020   HCT 34.5 (L) 10/20/2020   MCV 83.7 10/20/2020   PLT 73 (L) 10/20/2020   NEUTROABS 2.1 10/20/2020    CMP  Lab Results  Component Value Date   NA 138 08/25/2020   K 3.6 08/25/2020   CL 101 08/25/2020   CO2 29 08/25/2020   GLUCOSE 242 (H) 08/25/2020   BUN 17 08/25/2020   CREATININE 1.19 08/25/2020   CALCIUM 8.7 (L) 08/25/2020   PROT 6.6 08/25/2020   ALBUMIN 4.1 08/25/2020   AST 32 08/25/2020   ALT 30 08/25/2020   ALKPHOS 80 08/25/2020   BILITOT 1.3 (H) 08/25/2020   GFRNONAA >60 08/25/2020    No results found for: CEA1, CEA, CAN199, CA125  No results found for: INR, LABPROT  Imaging:  No results found.  Medications: I have reviewed the patient's current medications.   Assessment/Plan: Metastatic carcinoid tumor Left lower lobectomy February 2004 at [facility], 2.2 cm  typical carcinoid tumor Laparoscopic cholecystectomy November 2014, imaging revealed a 4.4 cm segment 8 liver lesion 03/18/2013-central hepatectomy and drainage of intra-abdominal abscess,- low-grade neuroendocrine carcinoma, tumor appeared totally excised,Ki-67 less than 5% CT's 05/09/2014, 08/16/2016, 09/19/2017-no evidence of recurrent disease CTs 914 2020-3 new liver lesions 12/26/2018 PET dotatate scan-at least 20 liver lesions and extensive bone metastases, hypermetabolic pancreas tail lesion 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 01/16/2019-biopsy of the left hepatic lobe lesion-metastatic neuroendocrine carcinoma,Ki-67 10%- interpretation limited due to scant remaining tumor may not be representative of the overall lesion, mismatch repair protein staining intact 02/27/2019-Sandostatin 30 mg every 4 weeks 09/18/2019- Netspot PET-increased activity in the pancreas tail and liver and bone metastases Late August 2021-everolimus, 7.5 mg daily, placed on hold September 2021 due to acute hyperglycemia/new onset diabetes 01/22/2020-everolimus resumed Netspot 09/29/2020-innumerable liver metastases, unchanged, stable tracer uptake in distal pancreas lesion, innumerable bone lesions, significant decrease in tracer uptake in left humeral lesion, mild decrease in FDG uptake in other bone lesions, no new site of metastatic disease Diabetes ERCP removal of bile duct stones March 2015, March 2018 Sleep apnea Hypertension COVID-19 infection September 2021 Cholecystectomy November 2426 Umbilical hernia repair 8/34/1962 Erectile dysfunction Chronic thrombocytopenia   Disposition: Michael Good appears stable.  The restaging PET reveals no new area of metastatic disease.  Many of the bone lesions have decreased FDG activity compared to the scan in July 2021.  I reviewed the PET images with him.  He will continue everolimus.  He continues monthly Sandostatin.  He appears to have a contact dermatitis at  the ankle area bilaterally.  He will try triamcinolone cream.  He will continue follow-up with urology for management of erectile dysfunction.  Michael Good will be scheduled for an office visit in 3-4 months.  He has stable chronic thrombocytopenia.  Betsy Coder, MD  10/20/2020  3:31 PM

## 2020-10-20 NOTE — Progress Notes (Signed)
Provided patient with #2 pre-paid [employer] cards for $25 each.

## 2020-10-21 LAB — CHROMOGRANIN A: Chromogranin A (ng/mL): 3107 ng/mL — ABNORMAL HIGH (ref 0.0–101.8)

## 2020-10-27 ENCOUNTER — Other Ambulatory Visit (HOSPITAL_COMMUNITY): Payer: Self-pay

## 2020-10-29 ENCOUNTER — Other Ambulatory Visit (HOSPITAL_COMMUNITY): Payer: Self-pay

## 2020-10-29 ENCOUNTER — Telehealth: Payer: Self-pay

## 2020-10-29 NOTE — Telephone Encounter (Signed)
TC from Pt stating he would like to reschedule his appointment for next week informed Pt he will get a call from scheduling tomorrow to reschedule his appointments. Pt verbalized understanding. Schedule message sent.

## 2020-10-30 ENCOUNTER — Telehealth: Payer: Self-pay

## 2020-10-30 NOTE — Telephone Encounter (Signed)
T/C from Pt stating that he wants to cancel his appointment for 11/03/20 and reschedule it for 11/25/20 informed Pt that I will discuss this with Dr Benay Spice but his 11/03/20 appointment is canceled. Pt verbalized understanding.

## 2020-11-03 ENCOUNTER — Inpatient Hospital Stay: Payer: Medicare Other

## 2020-11-23 ENCOUNTER — Other Ambulatory Visit (HOSPITAL_COMMUNITY): Payer: Self-pay

## 2020-11-26 ENCOUNTER — Other Ambulatory Visit (HOSPITAL_COMMUNITY): Payer: Self-pay

## 2020-11-30 ENCOUNTER — Other Ambulatory Visit (HOSPITAL_COMMUNITY): Payer: Self-pay

## 2020-12-01 ENCOUNTER — Other Ambulatory Visit (HOSPITAL_COMMUNITY): Payer: Self-pay

## 2020-12-01 ENCOUNTER — Other Ambulatory Visit: Payer: Self-pay

## 2020-12-01 ENCOUNTER — Inpatient Hospital Stay: Payer: Medicare Other | Attending: Oncology

## 2020-12-01 ENCOUNTER — Other Ambulatory Visit: Payer: Self-pay | Admitting: Oncology

## 2020-12-01 ENCOUNTER — Inpatient Hospital Stay: Payer: Medicare Other

## 2020-12-01 VITALS — BP 135/72 | HR 69 | Temp 98.2°F | Resp 18

## 2020-12-01 DIAGNOSIS — C7B03 Secondary carcinoid tumors of bone: Secondary | ICD-10-CM | POA: Diagnosis present

## 2020-12-01 DIAGNOSIS — C7B02 Secondary carcinoid tumors of liver: Secondary | ICD-10-CM | POA: Diagnosis present

## 2020-12-01 DIAGNOSIS — C7A090 Malignant carcinoid tumor of the bronchus and lung: Secondary | ICD-10-CM

## 2020-12-01 LAB — CBC WITH DIFFERENTIAL (CANCER CENTER ONLY)
Abs Immature Granulocytes: 0.01 10*3/uL (ref 0.00–0.07)
Basophils Absolute: 0 10*3/uL (ref 0.0–0.1)
Basophils Relative: 0 %
Eosinophils Absolute: 0.1 10*3/uL (ref 0.0–0.5)
Eosinophils Relative: 1 %
HCT: 33.3 % — ABNORMAL LOW (ref 39.0–52.0)
Hemoglobin: 11.8 g/dL — ABNORMAL LOW (ref 13.0–17.0)
Immature Granulocytes: 0 %
Lymphocytes Relative: 20 %
Lymphs Abs: 0.9 10*3/uL (ref 0.7–4.0)
MCH: 29 pg (ref 26.0–34.0)
MCHC: 35.4 g/dL (ref 30.0–36.0)
MCV: 81.8 fL (ref 80.0–100.0)
Monocytes Absolute: 0.3 10*3/uL (ref 0.1–1.0)
Monocytes Relative: 7 %
Neutro Abs: 3 10*3/uL (ref 1.7–7.7)
Neutrophils Relative %: 72 %
Platelet Count: 93 10*3/uL — ABNORMAL LOW (ref 150–400)
RBC: 4.07 MIL/uL — ABNORMAL LOW (ref 4.22–5.81)
RDW: 13 % (ref 11.5–15.5)
WBC Count: 4.2 10*3/uL (ref 4.0–10.5)
nRBC: 0 % (ref 0.0–0.2)

## 2020-12-01 MED ORDER — OCTREOTIDE ACETATE 30 MG IM KIT
30.0000 mg | PACK | Freq: Once | INTRAMUSCULAR | Status: AC
  Administered 2020-12-01: 30 mg via INTRAMUSCULAR

## 2020-12-01 NOTE — Patient Instructions (Signed)
Octreotide injection solution What is this medication? OCTREOTIDE (ok TREE oh tide) is used to reduce blood levels of growth hormone in patients with a condition called acromegaly. This medicine also reduces flushing and watery diarrhea caused by certain types of cancer. This medicine may be used for other purposes; ask your health care provider or pharmacist if you have questions. COMMON BRAND NAME(S): Bynfezia, Sandostatin What should I tell my care team before I take this medication? They need to know if you have any of these conditions: diabetes gallbladder disease kidney disease liver disease thyroid disease an unusual or allergic reaction to octreotide, other medicines, foods, dyes, or preservatives pregnant or trying to get pregnant breast-feeding How should I use this medication? This medicine is for injection under the skin or into a vein (only in emergency situations). It is usually given by a health care professional in a hospital or clinic setting. If you get this medicine at home, you will be taught how to prepare and give this medicine. Allow the injection solution to come to room temperature before use. Do not warm it artificially. Use exactly as directed. Take your medicine at regular intervals. Do not take your medicine more often than directed. It is important that you put your used needles and syringes in a special sharps container. Do not put them in a trash can. If you do not have a sharps container, call your pharmacist or healthcare provider to get one. Talk to your pediatrician regarding the use of this medicine in children. Special care may be needed. Overdosage: If you think you have taken too much of this medicine contact a poison control center or emergency room at once. NOTE: This medicine is only for you. Do not share this medicine with others. What if I miss a dose? If you miss a dose, take it as soon as you can. If it is almost time for your next dose, take only  that dose. Do not take double or extra doses. What may interact with this medication? bromocriptine certain medicines for blood pressure, heart disease, irregular heartbeat cyclosporine diuretics medicines for diabetes, including insulin quinidine This list may not describe all possible interactions. Give your health care provider a list of all the medicines, herbs, non-prescription drugs, or dietary supplements you use. Also tell them if you smoke, drink alcohol, or use illegal drugs. Some items may interact with your medicine. What should I watch for while using this medication? Visit your doctor or health care professional for regular checks on your progress. To help reduce irritation at the injection site, use a different site for each injection and make sure the solution is at room temperature before use. This medicine may cause decreases in blood sugar. Signs of low blood sugar include chills, cool, pale skin or cold sweats, drowsiness, extreme hunger, fast heartbeat, headache, nausea, nervousness or anxiety, shakiness, trembling, unsteadiness, tiredness, or weakness. Contact your doctor or health care professional right away if you experience any of these symptoms. This medicine may increase blood sugar. Ask your healthcare provider if changes in diet or medicines are needed if you have diabetes. This medicine may cause a decrease in vitamin B12. You should make sure that you get enough vitamin B12 while you are taking this medicine. Discuss the foods you eat and the vitamins you take with your health care professional. What side effects may I notice from receiving this medication? Side effects that you should report to your doctor or health care professional as soon as   possible: allergic reactions like skin rash, itching or hives, swelling of the face, lips, or tongue fast, slow, or irregular heartbeat right upper belly pain severe stomach pain signs and symptoms of high blood sugar such  as being more thirsty or hungry or having to urinate more than normal. You may also feel very tired or have blurry vision. signs and symptoms of low blood sugar such as feeling anxious; confusion; dizziness; increased hunger; unusually weak or tired; increased sweating; shakiness; cold, clammy skin; irritable; headache; blurred vision; fast heartbeat; loss of consciousness unusually weak or tired Side effects that usually do not require medical attention (report to your doctor or health care professional if they continue or are bothersome): diarrhea dizziness gas headache nausea, vomiting pain, redness, or irritation at site where injected upset stomach This list may not describe all possible side effects. Call your doctor for medical advice about side effects. You may report side effects to FDA at 1-800-FDA-1088. Where should I keep my medication? Keep out of the reach of children. Store in a refrigerator between 2 and 8 degrees C (36 and 46 degrees F). Protect from light. Allow to come to room temperature naturally. Do not use artificial heat. If protected from light, the injection may be stored at room temperature between 20 and 30 degrees C (70 and 86 degrees F) for 14 days. After the initial use, throw away any unused portion of a multiple dose vial after 14 days. Throw away unused portions of the ampules after use. NOTE: This sheet is a summary. It may not cover all possible information. If you have questions about this medicine, talk to your doctor, pharmacist, or health care provider.  2022 Elsevier/Gold Standard (2018-09-13 13:33:09)  

## 2020-12-02 ENCOUNTER — Other Ambulatory Visit (HOSPITAL_COMMUNITY): Payer: Self-pay

## 2020-12-02 ENCOUNTER — Encounter: Payer: Self-pay | Admitting: Oncology

## 2020-12-02 MED ORDER — EVEROLIMUS 7.5 MG PO TABS
ORAL_TABLET | Freq: Every day | ORAL | 1 refills | Status: DC
  Filled 2020-12-02: qty 30, 30d supply, fill #0
  Filled 2020-12-30: qty 30, 30d supply, fill #1

## 2020-12-03 ENCOUNTER — Other Ambulatory Visit (HOSPITAL_COMMUNITY): Payer: Self-pay

## 2020-12-29 ENCOUNTER — Inpatient Hospital Stay: Payer: Medicare Other

## 2020-12-30 ENCOUNTER — Other Ambulatory Visit (HOSPITAL_COMMUNITY): Payer: Self-pay

## 2020-12-31 ENCOUNTER — Other Ambulatory Visit (HOSPITAL_COMMUNITY): Payer: Self-pay

## 2021-01-06 ENCOUNTER — Other Ambulatory Visit: Payer: Self-pay

## 2021-01-06 ENCOUNTER — Other Ambulatory Visit (HOSPITAL_BASED_OUTPATIENT_CLINIC_OR_DEPARTMENT_OTHER): Payer: Self-pay

## 2021-01-06 ENCOUNTER — Inpatient Hospital Stay: Payer: Medicare Other

## 2021-01-06 ENCOUNTER — Inpatient Hospital Stay: Payer: Medicare Other | Attending: Oncology

## 2021-01-06 VITALS — BP 137/73 | HR 65 | Temp 98.4°F | Resp 18

## 2021-01-06 DIAGNOSIS — C7B03 Secondary carcinoid tumors of bone: Secondary | ICD-10-CM | POA: Insufficient documentation

## 2021-01-06 DIAGNOSIS — C7A090 Malignant carcinoid tumor of the bronchus and lung: Secondary | ICD-10-CM

## 2021-01-06 DIAGNOSIS — C7B02 Secondary carcinoid tumors of liver: Secondary | ICD-10-CM | POA: Insufficient documentation

## 2021-01-06 LAB — CBC WITH DIFFERENTIAL (CANCER CENTER ONLY)
Abs Immature Granulocytes: 0.01 10*3/uL (ref 0.00–0.07)
Basophils Absolute: 0 10*3/uL (ref 0.0–0.1)
Basophils Relative: 1 %
Eosinophils Absolute: 0.1 10*3/uL (ref 0.0–0.5)
Eosinophils Relative: 2 %
HCT: 33.8 % — ABNORMAL LOW (ref 39.0–52.0)
Hemoglobin: 12.1 g/dL — ABNORMAL LOW (ref 13.0–17.0)
Immature Granulocytes: 0 %
Lymphocytes Relative: 28 %
Lymphs Abs: 1 10*3/uL (ref 0.7–4.0)
MCH: 29.3 pg (ref 26.0–34.0)
MCHC: 35.8 g/dL (ref 30.0–36.0)
MCV: 81.8 fL (ref 80.0–100.0)
Monocytes Absolute: 0.2 10*3/uL (ref 0.1–1.0)
Monocytes Relative: 7 %
Neutro Abs: 2.3 10*3/uL (ref 1.7–7.7)
Neutrophils Relative %: 62 %
Platelet Count: 90 10*3/uL — ABNORMAL LOW (ref 150–400)
RBC: 4.13 MIL/uL — ABNORMAL LOW (ref 4.22–5.81)
RDW: 13.5 % (ref 11.5–15.5)
WBC Count: 3.6 10*3/uL — ABNORMAL LOW (ref 4.0–10.5)
nRBC: 0 % (ref 0.0–0.2)

## 2021-01-06 MED ORDER — OCTREOTIDE ACETATE 30 MG IM KIT
30.0000 mg | PACK | Freq: Once | INTRAMUSCULAR | Status: AC
  Administered 2021-01-06: 30 mg via INTRAMUSCULAR

## 2021-01-06 MED ORDER — FLUAD QUADRIVALENT 0.5 ML IM PRSY
PREFILLED_SYRINGE | INTRAMUSCULAR | 0 refills | Status: DC
  Filled 2021-01-06: qty 0.5, 1d supply, fill #0

## 2021-01-06 NOTE — Patient Instructions (Signed)
[facility]  Discharge Instructions: Thank you for choosing [facility] to provide your oncology and hematology care.   If you have a lab appointment with the [facility], please go directly to the [facility] and check in at the registration area.   Wear comfortable clothing and clothing appropriate for easy access to any Portacath or PICC line.   We strive to give you quality time with your provider. You may need to reschedule your appointment if you arrive late (15 or more minutes).  Arriving late affects you and other patients whose appointments are after yours.  Also, if you miss three or more appointments without notifying the office, you may be dismissed from the clinic at the provider's discretion.      For prescription refill requests, have your pharmacy contact our office and allow 72 hours for refills to be completed.    Today you received the following Sandostatin [place]    To help prevent nausea and vomiting after your treatment, we encourage you to take your nausea medication as directed.  BELOW ARE SYMPTOMS THAT SHOULD BE REPORTED IMMEDIATELY: *FEVER GREATER THAN 100.4 F (38 C) OR HIGHER *CHILLS OR SWEATING *NAUSEA AND VOMITING THAT IS NOT CONTROLLED WITH YOUR NAUSEA MEDICATION *UNUSUAL SHORTNESS OF BREATH *UNUSUAL BRUISING OR BLEEDING *URINARY PROBLEMS (pain or burning when urinating, or frequent urination) *BOWEL PROBLEMS (unusual diarrhea, constipation, pain near the anus) TENDERNESS IN MOUTH AND THROAT WITH OR WITHOUT PRESENCE OF ULCERS (sore throat, sores in mouth, or a toothache) UNUSUAL RASH, SWELLING OR PAIN  UNUSUAL VAGINAL DISCHARGE OR ITCHING   Items with * indicate a potential emergency and should be followed up as soon as possible or go to the Emergency Department if any problems should occur.  Please show the CHEMOTHERAPY ALERT CARD or IMMUNOTHERAPY ALERT CARD at check-in to the Emergency Department and triage  nurse.  Should you have questions after your visit or need to cancel or reschedule your appointment, please contact [facility]  Dept: [phone]  and follow the prompts.  Office hours are 8:00 a.m. to 4:30 p.m. Monday - Friday. Please note that voicemails left after 4:00 p.m. may not be returned until the following business day.  We are closed weekends and major holidays. You have access to a nurse at all times for urgent questions. Please call the main number to the clinic Dept: [phone] and follow the prompts.   For any non-urgent questions, you may also contact your provider using MyChart. We now offer e-Visits for anyone 67 and older to request care online for non-urgent symptoms. For details visit [email or web address].   Also download the MyChart app! Go to the app store, search "MyChart", open the app, select [facility], and log in with your MyChart username and password.  Due to Covid, a mask is required upon entering the hospital/clinic. If you do not have a mask, one will be given to you upon arrival. For doctor visits, patients may have 1 support person aged 67 or older with them. For treatment visits, patients cannot have anyone with them due to current Covid guidelines and our immunocompromised population.   Octreotide injection solution What is this medication? OCTREOTIDE (ok TREE oh tide) is used to reduce blood levels of growth hormone in patients with a condition called acromegaly. This medicine also reduces flushing and watery diarrhea caused by certain types of cancer. This medicine may be used for other purposes; ask your health  care provider or pharmacist if you have questions. COMMON BRAND NAME(S): Leatha Gilding, Sandostatin What should I tell my care team before I take this medication? They need to know if you have any of these conditions: diabetes gallbladder disease kidney disease liver disease thyroid disease an unusual or allergic  reaction to octreotide, other medicines, foods, dyes, or preservatives pregnant or trying to get pregnant breast-feeding How should I use this medication? This medicine is for injection under the skin or into a vein (only in emergency situations). It is usually given by a health care professional in a hospital or clinic setting. If you get this medicine at home, you will be taught how to prepare and give this medicine. Allow the injection solution to come to room temperature before use. Do not warm it artificially. Use exactly as directed. Take your medicine at regular intervals. Do not take your medicine more often than directed. It is important that you put your used needles and syringes in a special sharps container. Do not put them in a trash can. If you do not have a sharps container, call your pharmacist or healthcare provider to get one. Talk to your pediatrician regarding the use of this medicine in children. Special care may be needed. Overdosage: If you think you have taken too much of this medicine contact a poison control center or emergency room at once. NOTE: This medicine is only for you. Do not share this medicine with others. What if I miss a dose? If you miss a dose, take it as soon as you can. If it is almost time for your next dose, take only that dose. Do not take double or extra doses. What may interact with this medication? bromocriptine certain medicines for blood pressure, heart disease, irregular heartbeat cyclosporine diuretics medicines for diabetes, including insulin quinidine This list may not describe all possible interactions. Give your health care provider a list of all the medicines, herbs, non-prescription drugs, or dietary supplements you use. Also tell them if you smoke, drink alcohol, or use illegal drugs. Some items may interact with your medicine. What should I watch for while using this medication? Visit your doctor or health care professional for regular  checks on your progress. To help reduce irritation at the injection site, use a different site for each injection and make sure the solution is at room temperature before use. This medicine may cause decreases in blood sugar. Signs of low blood sugar include chills, cool, pale skin or cold sweats, drowsiness, extreme hunger, fast heartbeat, headache, nausea, nervousness or anxiety, shakiness, trembling, unsteadiness, tiredness, or weakness. Contact your doctor or health care professional right away if you experience any of these symptoms. This medicine may increase blood sugar. Ask your healthcare provider if changes in diet or medicines are needed if you have diabetes. This medicine may cause a decrease in vitamin B12. You should make sure that you get enough vitamin B12 while you are taking this medicine. Discuss the foods you eat and the vitamins you take with your health care professional. What side effects may I notice from receiving this medication? Side effects that you should report to your doctor or health care professional as soon as possible: allergic reactions like skin rash, itching or hives, swelling of the face, lips, or tongue fast, slow, or irregular heartbeat right upper belly pain severe stomach pain signs and symptoms of high blood sugar such as being more thirsty or hungry or having to urinate more than normal. You may also  feel very tired or have blurry vision. signs and symptoms of low blood sugar such as feeling anxious; confusion; dizziness; increased hunger; unusually weak or tired; increased sweating; shakiness; cold, clammy skin; irritable; headache; blurred vision; fast heartbeat; loss of consciousness unusually weak or tired Side effects that usually do not require medical attention (report to your doctor or health care professional if they continue or are bothersome): diarrhea dizziness gas headache nausea, vomiting pain, redness, or irritation at site where  injected upset stomach This list may not describe all possible side effects. Call your doctor for medical advice about side effects. You may report side effects to FDA at 1-800-FDA-1088. Where should I keep my medication? Keep out of the reach of children. Store in a refrigerator between 2 and 8 degrees C (36 and 46 degrees F). Protect from light. Allow to come to room temperature naturally. Do not use artificial heat. If protected from light, the injection may be stored at room temperature between 20 and 30 degrees C (70 and 86 degrees F) for 14 days. After the initial use, throw away any unused portion of a multiple dose vial after 14 days. Throw away unused portions of the ampules after use. NOTE: This sheet is a summary. It may not cover all possible information. If you have questions about this medicine, talk to your doctor, pharmacist, or health care provider.  2022 Elsevier/Gold Standard (2018-09-13 00:00:00)

## 2021-01-26 ENCOUNTER — Other Ambulatory Visit (HOSPITAL_COMMUNITY): Payer: Self-pay

## 2021-01-26 ENCOUNTER — Ambulatory Visit: Payer: Medicare Other

## 2021-01-26 ENCOUNTER — Other Ambulatory Visit: Payer: Medicare Other

## 2021-01-26 ENCOUNTER — Ambulatory Visit: Payer: Medicare Other | Admitting: Oncology

## 2021-02-01 ENCOUNTER — Inpatient Hospital Stay: Payer: Medicare Other

## 2021-02-01 ENCOUNTER — Inpatient Hospital Stay: Payer: Medicare Other | Attending: Oncology

## 2021-02-01 ENCOUNTER — Other Ambulatory Visit: Payer: Self-pay | Admitting: Oncology

## 2021-02-01 ENCOUNTER — Other Ambulatory Visit (HOSPITAL_COMMUNITY): Payer: Self-pay

## 2021-02-01 ENCOUNTER — Other Ambulatory Visit: Payer: Self-pay

## 2021-02-01 ENCOUNTER — Encounter: Payer: Self-pay | Admitting: Nurse Practitioner

## 2021-02-01 ENCOUNTER — Inpatient Hospital Stay (HOSPITAL_BASED_OUTPATIENT_CLINIC_OR_DEPARTMENT_OTHER): Payer: Medicare Other | Admitting: Nurse Practitioner

## 2021-02-01 VITALS — BP 152/79 | HR 70 | Temp 97.8°F | Resp 18 | Ht 67.0 in | Wt 188.2 lb

## 2021-02-01 DIAGNOSIS — C7A098 Malignant carcinoid tumors of other sites: Secondary | ICD-10-CM | POA: Insufficient documentation

## 2021-02-01 DIAGNOSIS — C7B03 Secondary carcinoid tumors of bone: Secondary | ICD-10-CM | POA: Diagnosis present

## 2021-02-01 DIAGNOSIS — N529 Male erectile dysfunction, unspecified: Secondary | ICD-10-CM | POA: Insufficient documentation

## 2021-02-01 DIAGNOSIS — D696 Thrombocytopenia, unspecified: Secondary | ICD-10-CM | POA: Insufficient documentation

## 2021-02-01 DIAGNOSIS — E119 Type 2 diabetes mellitus without complications: Secondary | ICD-10-CM | POA: Insufficient documentation

## 2021-02-01 DIAGNOSIS — I10 Essential (primary) hypertension: Secondary | ICD-10-CM | POA: Diagnosis not present

## 2021-02-01 DIAGNOSIS — C7B02 Secondary carcinoid tumors of liver: Secondary | ICD-10-CM | POA: Insufficient documentation

## 2021-02-01 DIAGNOSIS — Z79899 Other long term (current) drug therapy: Secondary | ICD-10-CM | POA: Diagnosis not present

## 2021-02-01 DIAGNOSIS — Z23 Encounter for immunization: Secondary | ICD-10-CM | POA: Diagnosis not present

## 2021-02-01 DIAGNOSIS — C7A090 Malignant carcinoid tumor of the bronchus and lung: Secondary | ICD-10-CM

## 2021-02-01 LAB — CMP (CANCER CENTER ONLY)
ALT: 24 U/L (ref 0–44)
AST: 21 U/L (ref 15–41)
Albumin: 4.5 g/dL (ref 3.5–5.0)
Alkaline Phosphatase: 116 U/L (ref 38–126)
Anion gap: 8 (ref 5–15)
BUN: 15 mg/dL (ref 8–23)
CO2: 28 mmol/L (ref 22–32)
Calcium: 9 mg/dL (ref 8.9–10.3)
Chloride: 101 mmol/L (ref 98–111)
Creatinine: 1.03 mg/dL (ref 0.61–1.24)
GFR, Estimated: >60 mL/min (ref >60)
Glucose, Bld: 339 mg/dL — ABNORMAL HIGH (ref 70–99)
Potassium: 3.8 mmol/L (ref 3.5–5.1)
Sodium: 137 mmol/L (ref 135–145)
Total Bilirubin: 1.3 mg/dL — ABNORMAL HIGH (ref 0.3–1.2)
Total Protein: 7 g/dL (ref 6.5–8.1)

## 2021-02-01 LAB — CBC WITH DIFFERENTIAL (CANCER CENTER ONLY)
Abs Immature Granulocytes: 0.01 10*3/uL (ref 0.00–0.07)
Basophils Absolute: 0 10*3/uL (ref 0.0–0.1)
Basophils Relative: 0 %
Eosinophils Absolute: 0 10*3/uL (ref 0.0–0.5)
Eosinophils Relative: 1 %
HCT: 34.1 % — ABNORMAL LOW (ref 39.0–52.0)
Hemoglobin: 12.2 g/dL — ABNORMAL LOW (ref 13.0–17.0)
Immature Granulocytes: 0 %
Lymphocytes Relative: 28 %
Lymphs Abs: 0.8 10*3/uL (ref 0.7–4.0)
MCH: 29.3 pg (ref 26.0–34.0)
MCHC: 35.8 g/dL (ref 30.0–36.0)
MCV: 82 fL (ref 80.0–100.0)
Monocytes Absolute: 0.2 10*3/uL (ref 0.1–1.0)
Monocytes Relative: 7 %
Neutro Abs: 1.9 10*3/uL (ref 1.7–7.7)
Neutrophils Relative %: 64 %
Platelet Count: 82 10*3/uL — ABNORMAL LOW (ref 150–400)
RBC: 4.16 MIL/uL — ABNORMAL LOW (ref 4.22–5.81)
RDW: 13.6 % (ref 11.5–15.5)
WBC Count: 3 10*3/uL — ABNORMAL LOW (ref 4.0–10.5)
nRBC: 0 % (ref 0.0–0.2)

## 2021-02-01 MED ORDER — INFLUENZA VAC A&B SA ADJ QUAD 0.5 ML IM PRSY
0.5000 mL | PREFILLED_SYRINGE | Freq: Once | INTRAMUSCULAR | Status: AC
  Administered 2021-02-01: 0.5 mL via INTRAMUSCULAR
  Filled 2021-02-01: qty 0.5

## 2021-02-01 MED ORDER — OCTREOTIDE ACETATE 30 MG IM KIT
30.0000 mg | PACK | Freq: Once | INTRAMUSCULAR | Status: AC
  Administered 2021-02-01: 30 mg via INTRAMUSCULAR

## 2021-02-01 MED FILL — Everolimus Tab 7.5 MG: ORAL | 28 days supply | Qty: 28 | Fill #0 | Status: CN

## 2021-02-01 MED FILL — Everolimus Tab 7.5 MG: ORAL | 30 days supply | Qty: 30 | Fill #0 | Status: AC

## 2021-02-01 NOTE — Progress Notes (Signed)
[facility] OFFICE PROGRESS NOTE   Diagnosis: Carcinoid tumor  INTERVAL HISTORY:   Michael Good returns as scheduled.  He continues everolimus.  He continues monthly Sandostatin.  He denies diarrhea.  No flushing episodes.  No abdominal pain.  He notes bleeding on his hands/forearms with minor trauma.  No other bleeding.  He reports a good appetite but is concerned regarding continued weight loss.  He becomes full quickly.  No nausea or vomiting.  Objective:  Vital signs in last 24 hours:  Blood pressure (!) 152/79, pulse 70, temperature 97.8 F (36.6 C), temperature source Oral, resp. rate 18, height 5' 7"  (1.702 m), weight 188 lb 3.2 oz (85.4 kg), SpO2 100 %.    HEENT: No thrush or ulcers. Resp: Lungs clear bilaterally. Cardio: Regular rate and rhythm.   GI: Abdomen soft and nontender.  No hepatosplenomegaly. Vascular: Trace edema lower leg bilaterally. Skin: Mild hyperpigmentation/raised rash at the lower leg bilaterally.   Lab Results:  Lab Results  Component Value Date   WBC 3.0 (L) 02/01/2021   HGB 12.2 (L) 02/01/2021   HCT 34.1 (L) 02/01/2021   MCV 82.0 02/01/2021   PLT 82 (L) 02/01/2021   NEUTROABS 1.9 02/01/2021    Imaging:  No results found.  Medications: I have reviewed the patient's current medications.  Assessment/Plan: Metastatic carcinoid tumor Left lower lobectomy February 2004 at [facility], 2.2 cm typical carcinoid tumor Laparoscopic cholecystectomy November 2014, imaging revealed a 4.4 cm segment 8 liver lesion 03/18/2013-central hepatectomy and drainage of intra-abdominal abscess,- low-grade neuroendocrine carcinoma, tumor appeared totally excised,Ki-67 less than 5% CT's 05/09/2014, 08/16/2016, 09/19/2017-no evidence of recurrent disease CTs 914 2020-3 new liver lesions 12/26/2018 PET dotatate scan-at least 20 liver lesions and extensive bone metastases, hypermetabolic pancreas tail lesion adjacent to the known pancreatic cystic  lesion 01/16/2019-biopsy of the left hepatic lobe lesion-metastatic neuroendocrine carcinoma,Ki-67 10%- interpretation limited due to scant remaining tumor may not be representative of the overall lesion, mismatch repair protein staining intact 02/27/2019-Sandostatin 30 mg every 4 weeks 09/18/2019- Netspot PET-increased activity in the pancreas tail and liver and bone metastases Late August 2021-everolimus, 7.5 mg daily, placed on hold September 2021 due to acute hyperglycemia/new onset diabetes 01/22/2020-everolimus resumed Netspot 09/29/2020-innumerable liver metastases, unchanged, stable tracer uptake in distal pancreas lesion, innumerable bone lesions, significant decrease in tracer uptake in left humeral lesion, mild decrease in FDG uptake in other bone lesions, no new site of metastatic disease Diabetes ERCP removal of bile duct stones March 2015, March 2018 Sleep apnea Hypertension COVID-19 infection September 2021 Cholecystectomy November 0962 Umbilical hernia repair 8/36/6294 Erectile dysfunction Chronic thrombocytopenia  Disposition: Mr. Litzau appears unchanged.  He continues monthly Sandostatin.  He will continue everolimus.  Etiology of the weight loss is unclear.  Referral made to the [facility] dietitian.  Poorly controlled diabetes may be contributing.  He will discuss with his PCP.  We reviewed the CBC and chemistry panel from today.  Labs adequate to continue everolimus.  He has stable mild to moderate thrombocytopenia.  Blood sugar elevated at 339.  He will try to monitor blood sugars at home.  He will receive the influenza vaccine today.  We will see him in follow-up in 3 months.  Patient seen with Dr. Benay Spice.  Ned Card ANP/GNP-BC   02/01/2021  3:42 PM  This was a shared visit with Ned Card.  Michael Good was interviewed and examined.  His overall status appears unchanged, but he has lost weight over the past 10 months.  The weight loss may be related to the  metastatic carcinoid tumor, everolimus, or diabetes.  We recommended he follow-up with his primary provider to discuss the weight loss and diabetes management.  We will follow-up on the chromogranin a level from today.  I was present for greater than 50% of today's visit.  I performed medical decision making.  Julieanne Manson, MD

## 2021-02-01 NOTE — Patient Instructions (Signed)
[facility] [facility]  Discharge Instructions: Thank you for choosing [facility] to provide your oncology and hematology care.   If you have a lab appointment with the [facility], please go directly to the [facility] and check in at the registration area.   Wear comfortable clothing and clothing appropriate for easy access to any Portacath or PICC line.   We strive to give you quality time with your provider. You may need to reschedule your appointment if you arrive late (15 or more minutes).  Arriving late affects you and other patients whose appointments are after yours.  Also, if you miss three or more appointments without notifying the office, you may be dismissed from the clinic at the provider's discretion.      For prescription refill requests, have your pharmacy contact our office and allow 72 hours for refills to be completed.    Today you received the following Sandostatin      To help prevent nausea and vomiting after your treatment, we encourage you to take your nausea medication as directed.  BELOW ARE SYMPTOMS THAT SHOULD BE REPORTED IMMEDIATELY: *FEVER GREATER THAN 100.4 F (38 C) OR HIGHER *CHILLS OR SWEATING *NAUSEA AND VOMITING THAT IS NOT CONTROLLED WITH YOUR NAUSEA MEDICATION *UNUSUAL SHORTNESS OF BREATH *UNUSUAL BRUISING OR BLEEDING *URINARY PROBLEMS (pain or burning when urinating, or frequent urination) *BOWEL PROBLEMS (unusual diarrhea, constipation, pain near the anus) TENDERNESS IN MOUTH AND THROAT WITH OR WITHOUT PRESENCE OF ULCERS (sore throat, sores in mouth, or a toothache) UNUSUAL RASH, SWELLING OR PAIN  UNUSUAL VAGINAL DISCHARGE OR ITCHING   Items with * indicate a potential emergency and should be followed up as soon as possible or go to the Emergency Department if any problems should occur.  Please show the CHEMOTHERAPY ALERT CARD or IMMUNOTHERAPY ALERT CARD at check-in to the Emergency Department and triage  nurse.  Should you have questions after your visit or need to cancel or reschedule your appointment, please contact [facility]  Dept: [phone]  and follow the prompts.  Office hours are 8:00 a.m. to 4:30 p.m. Monday - Friday. Please note that voicemails left after 4:00 p.m. may not be returned until the following business day.  We are closed weekends and major holidays. You have access to a nurse at all times for urgent questions. Please call the main number to the clinic Dept: [phone] and follow the prompts.   For any non-urgent questions, you may also contact your provider using MyChart. We now offer e-Visits for anyone 35 and older to request care online for non-urgent symptoms. For details visit [email or web address].   Also download the MyChart app! Go to the app store, search "MyChart", open the app, select [facility], and log in with your MyChart username and password.  Due to Covid, a mask is required upon entering the hospital/clinic. If you do not have a mask, one will be given to you upon arrival. For doctor visits, patients may have 1 support person aged 82 or older with them. For treatment visits, patients cannot have anyone with them due to current Covid guidelines and our immunocompromised population.   Octreotide injection solution What is this medication? OCTREOTIDE (ok TREE oh tide) is used to reduce blood levels of growth hormone in patients with a condition called acromegaly. This medicine also reduces flushing and watery diarrhea caused by certain types of cancer. This medicine may be used for other purposes; ask your health  care provider or pharmacist if you have questions. COMMON BRAND NAME(S): Leatha Gilding, Sandostatin What should I tell my care team before I take this medication? They need to know if you have any of these conditions: diabetes gallbladder disease kidney disease liver disease thyroid disease an unusual or allergic  reaction to octreotide, other medicines, foods, dyes, or preservatives pregnant or trying to get pregnant breast-feeding How should I use this medication? This medicine is for injection under the skin or into a vein (only in emergency situations). It is usually given by a health care professional in a hospital or clinic setting. If you get this medicine at home, you will be taught how to prepare and give this medicine. Allow the injection solution to come to room temperature before use. Do not warm it artificially. Use exactly as directed. Take your medicine at regular intervals. Do not take your medicine more often than directed. It is important that you put your used needles and syringes in a special sharps container. Do not put them in a trash can. If you do not have a sharps container, call your pharmacist or healthcare provider to get one. Talk to your pediatrician regarding the use of this medicine in children. Special care may be needed. Overdosage: If you think you have taken too much of this medicine contact a poison control center or emergency room at once. NOTE: This medicine is only for you. Do not share this medicine with others. What if I miss a dose? If you miss a dose, take it as soon as you can. If it is almost time for your next dose, take only that dose. Do not take double or extra doses. What may interact with this medication? bromocriptine certain medicines for blood pressure, heart disease, irregular heartbeat cyclosporine diuretics medicines for diabetes, including insulin quinidine This list may not describe all possible interactions. Give your health care provider a list of all the medicines, herbs, non-prescription drugs, or dietary supplements you use. Also tell them if you smoke, drink alcohol, or use illegal drugs. Some items may interact with your medicine. What should I watch for while using this medication? Visit your doctor or health care professional for regular  checks on your progress. To help reduce irritation at the injection site, use a different site for each injection and make sure the solution is at room temperature before use. This medicine may cause decreases in blood sugar. Signs of low blood sugar include chills, cool, pale skin or cold sweats, drowsiness, extreme hunger, fast heartbeat, headache, nausea, nervousness or anxiety, shakiness, trembling, unsteadiness, tiredness, or weakness. Contact your doctor or health care professional right away if you experience any of these symptoms. This medicine may increase blood sugar. Ask your healthcare provider if changes in diet or medicines are needed if you have diabetes. This medicine may cause a decrease in vitamin B12. You should make sure that you get enough vitamin B12 while you are taking this medicine. Discuss the foods you eat and the vitamins you take with your health care professional. What side effects may I notice from receiving this medication? Side effects that you should report to your doctor or health care professional as soon as possible: allergic reactions like skin rash, itching or hives, swelling of the face, lips, or tongue fast, slow, or irregular heartbeat right upper belly pain severe stomach pain signs and symptoms of high blood sugar such as being more thirsty or hungry or having to urinate more than normal. You may also  feel very tired or have blurry vision. signs and symptoms of low blood sugar such as feeling anxious; confusion; dizziness; increased hunger; unusually weak or tired; increased sweating; shakiness; cold, clammy skin; irritable; headache; blurred vision; fast heartbeat; loss of consciousness unusually weak or tired Side effects that usually do not require medical attention (report to your doctor or health care professional if they continue or are bothersome): diarrhea dizziness gas headache nausea, vomiting pain, redness, or irritation at site where  injected upset stomach This list may not describe all possible side effects. Call your doctor for medical advice about side effects. You may report side effects to FDA at 1-800-FDA-1088. Where should I keep my medication? Keep out of the reach of children. Store in a refrigerator between 2 and 8 degrees C (36 and 46 degrees F). Protect from light. Allow to come to room temperature naturally. Do not use artificial heat. If protected from light, the injection may be stored at room temperature between 20 and 30 degrees C (70 and 86 degrees F) for 14 days. After the initial use, throw away any unused portion of a multiple dose vial after 14 days. Throw away unused portions of the ampules after use. NOTE: This sheet is a summary. It may not cover all possible information. If you have questions about this medicine, talk to your doctor, pharmacist, or health care provider.  2022 Elsevier/Gold Standard (2018-09-13 00:00:00)

## 2021-02-02 ENCOUNTER — Other Ambulatory Visit (HOSPITAL_COMMUNITY): Payer: Self-pay

## 2021-02-02 ENCOUNTER — Telehealth: Payer: Self-pay | Admitting: Oncology

## 2021-02-02 NOTE — Telephone Encounter (Signed)
Scheduled appointment per 12/06 los. Left message.

## 2021-02-03 ENCOUNTER — Encounter: Payer: Self-pay | Admitting: Oncology

## 2021-02-03 LAB — CHROMOGRANIN A: Chromogranin A (ng/mL): 2443 ng/mL — ABNORMAL HIGH (ref 0.0–101.8)

## 2021-02-03 NOTE — Progress Notes (Signed)
Patient called regarding scheduling conflict for nutrition appointment next week. Advised I would send message to care team.  Message sent to Nutrition, Scheduling, and  Providers as well as Financial for [facility].

## 2021-02-10 ENCOUNTER — Telehealth: Payer: Self-pay | Admitting: Nutrition

## 2021-02-10 ENCOUNTER — Inpatient Hospital Stay: Payer: Medicare Other | Admitting: Nutrition

## 2021-02-10 NOTE — Telephone Encounter (Signed)
I contacted patient by telephone for nutrition visit as requested. Patient did not answer phone. I left a message with name and number for return call.

## 2021-02-18 ENCOUNTER — Other Ambulatory Visit (HOSPITAL_COMMUNITY): Payer: Self-pay

## 2021-02-18 MED FILL — Everolimus Tab 7.5 MG: ORAL | 30 days supply | Qty: 30 | Fill #1 | Status: AC

## 2021-02-23 ENCOUNTER — Other Ambulatory Visit: Payer: Medicare Other

## 2021-02-23 ENCOUNTER — Ambulatory Visit: Payer: Medicare Other

## 2021-02-25 ENCOUNTER — Other Ambulatory Visit (HOSPITAL_COMMUNITY): Payer: Self-pay

## 2021-03-03 ENCOUNTER — Inpatient Hospital Stay: Payer: Medicare Other

## 2021-03-03 ENCOUNTER — Inpatient Hospital Stay: Payer: Medicare Other | Admitting: Nutrition

## 2021-03-03 ENCOUNTER — Inpatient Hospital Stay: Payer: Medicare Other | Attending: Oncology

## 2021-03-03 ENCOUNTER — Other Ambulatory Visit: Payer: Self-pay

## 2021-03-03 VITALS — BP 167/80 | HR 70 | Temp 98.2°F | Resp 18

## 2021-03-03 DIAGNOSIS — I10 Essential (primary) hypertension: Secondary | ICD-10-CM | POA: Diagnosis not present

## 2021-03-03 DIAGNOSIS — C7B02 Secondary carcinoid tumors of liver: Secondary | ICD-10-CM | POA: Insufficient documentation

## 2021-03-03 DIAGNOSIS — G4730 Sleep apnea, unspecified: Secondary | ICD-10-CM | POA: Diagnosis not present

## 2021-03-03 DIAGNOSIS — C7A090 Malignant carcinoid tumor of the bronchus and lung: Secondary | ICD-10-CM | POA: Diagnosis present

## 2021-03-03 DIAGNOSIS — Z79899 Other long term (current) drug therapy: Secondary | ICD-10-CM | POA: Diagnosis not present

## 2021-03-03 DIAGNOSIS — C7B03 Secondary carcinoid tumors of bone: Secondary | ICD-10-CM | POA: Diagnosis present

## 2021-03-03 DIAGNOSIS — E119 Type 2 diabetes mellitus without complications: Secondary | ICD-10-CM | POA: Insufficient documentation

## 2021-03-03 LAB — CBC WITH DIFFERENTIAL (CANCER CENTER ONLY)
Abs Immature Granulocytes: 0.02 10*3/uL (ref 0.00–0.07)
Basophils Absolute: 0 10*3/uL (ref 0.0–0.1)
Basophils Relative: 0 %
Eosinophils Absolute: 0.1 10*3/uL (ref 0.0–0.5)
Eosinophils Relative: 3 %
HCT: 34.8 % — ABNORMAL LOW (ref 39.0–52.0)
Hemoglobin: 12.3 g/dL — ABNORMAL LOW (ref 13.0–17.0)
Immature Granulocytes: 1 %
Lymphocytes Relative: 29 %
Lymphs Abs: 0.9 10*3/uL (ref 0.7–4.0)
MCH: 29.1 pg (ref 26.0–34.0)
MCHC: 35.3 g/dL (ref 30.0–36.0)
MCV: 82.3 fL (ref 80.0–100.0)
Monocytes Absolute: 0.3 10*3/uL (ref 0.1–1.0)
Monocytes Relative: 9 %
Neutro Abs: 1.9 10*3/uL (ref 1.7–7.7)
Neutrophils Relative %: 58 %
Platelet Count: 85 10*3/uL — ABNORMAL LOW (ref 150–400)
RBC: 4.23 MIL/uL (ref 4.22–5.81)
RDW: 13.9 % (ref 11.5–15.5)
WBC Count: 3.2 10*3/uL — ABNORMAL LOW (ref 4.0–10.5)
nRBC: 0 % (ref 0.0–0.2)

## 2021-03-03 MED ORDER — OCTREOTIDE ACETATE 30 MG IM KIT
30.0000 mg | PACK | Freq: Once | INTRAMUSCULAR | Status: AC
  Administered 2021-03-03: 30 mg via INTRAMUSCULAR

## 2021-03-03 NOTE — Patient Instructions (Signed)
[facility]  Discharge Instructions: Thank you for choosing [facility] to provide your oncology and hematology care.   If you have a lab appointment with the [facility], please go directly to the [facility] and check in at the registration area.   Wear comfortable clothing and clothing appropriate for easy access to any Portacath or PICC line.   We strive to give you quality time with your provider. You may need to reschedule your appointment if you arrive late (15 or more minutes).  Arriving late affects you and other patients whose appointments are after yours.  Also, if you miss three or more appointments without notifying the office, you may be dismissed from the clinic at the providers discretion.      For prescription refill requests, have your pharmacy contact our office and allow 72 hours for refills to be completed.    Today you received the following chemotherapy and/or immunotherapy agents sandostatin.   To help prevent nausea and vomiting after your treatment, we encourage you to take your nausea medication as directed.  BELOW ARE SYMPTOMS THAT SHOULD BE REPORTED IMMEDIATELY: *FEVER GREATER THAN 100.4 F (38 C) OR HIGHER *CHILLS OR SWEATING *NAUSEA AND VOMITING THAT IS NOT CONTROLLED WITH YOUR NAUSEA MEDICATION *UNUSUAL SHORTNESS OF BREATH *UNUSUAL BRUISING OR BLEEDING *URINARY PROBLEMS (pain or burning when urinating, or frequent urination) *BOWEL PROBLEMS (unusual diarrhea, constipation, pain near the anus) TENDERNESS IN MOUTH AND THROAT WITH OR WITHOUT PRESENCE OF ULCERS (sore throat, sores in mouth, or a toothache) UNUSUAL RASH, SWELLING OR PAIN  UNUSUAL VAGINAL DISCHARGE OR ITCHING   Items with * indicate a potential emergency and should be followed up as soon as possible or go to the Emergency Department if any problems should occur.  Please show the CHEMOTHERAPY ALERT CARD or IMMUNOTHERAPY ALERT CARD at check-in to the  Emergency Department and triage nurse.  Should you have questions after your visit or need to cancel or reschedule your appointment, please contact [facility]  Dept: [phone]  and follow the prompts.  Office hours are 8:00 a.m. to 4:30 p.m. Monday - Friday. Please note that voicemails left after 4:00 p.m. may not be returned until the following business day.  We are closed weekends and major holidays. You have access to a nurse at all times for urgent questions. Please call the main number to the clinic Dept: [phone] and follow the prompts.   For any non-urgent questions, you may also contact your provider using MyChart. We now offer e-Visits for anyone 44 and older to request care online for non-urgent symptoms. For details visit [email or web address].   Also download the MyChart app! Go to the app store, search "MyChart", open the app, select [facility], and log in with your MyChart username and password.  Due to Covid, a mask is required upon entering the hospital/clinic. If you do not have a mask, one will be given to you upon arrival. For doctor visits, patients may have 1 support person aged 69 or older with them. For treatment visits, patients cannot have anyone with them due to current Covid guidelines and our immunocompromised population.

## 2021-03-03 NOTE — Progress Notes (Signed)
Patient tolerated Sandostatin injection with no complaints voiced.  Patient stable during and after injection.  See MAR for details.  Site clean and dry with no bruising or swelling noted at site.  Band aid applied.  Vss with discharge and left in satisfactory condition with no s/s of distress noted.  

## 2021-03-03 NOTE — Progress Notes (Deleted)
Nutrition Assessment  Reason for Assessment: No chief complaint on file.    ASSESSMENT: ***   NUTRITION - FOCUSED PHYSICAL EXAM:    MEDICATIONS: ***   LABS: ***   ANTHROPOMETRICS: Height:   Ht Readings from Last 1 Encounters:  02/01/21 5\' 7"  (1.702 m)   Weight:  Wt Readings from Last 1 Encounters:  02/01/21 188 lb 3.2 oz (85.4 kg)   BMI:   BMI Readings from Last 1 Encounters:  02/01/21 29.48 kg/m   UBW:  No data recorded IBW:  No data recorded   ESTIMATED ENERGY NEEDS:  Kcal:    Protein:   Fluid:      NUTRITION DIAGNOSIS:   No data recorded related to No data recorded as evidenced by No data recorded.   DOCUMENTATION CODES:      INTERVENTION:     GOAL:      MONITOR:      Next Visit: ***   Kalese Ensz, RD

## 2021-03-03 NOTE — Progress Notes (Signed)
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 Benay Spice.  PMH includes DM, ERCP, Sleep Apnea, and HTN.  Medications include Lasix, Glucophage, and Zofran.  Labs reviewed. CBGs [phone] on Dec 5.  Height: 5'7". Weight:185.8 pounds. UBW:203 pounds January 2022. BMI:29.1  Estimated Nutrition Needs: 2000-2200 kcal, 100-110 gm protein, 2.2 L fluid  Patient reports poor appetite and early satiety. He eats a package of crackers with milk or juice for breakfast. He eats a hot dog or a sandwich for lunch with water. At dinner he may consume chicken tenders and a baked potato. He snacks occasionally on gummy bears, fruit, or pickles. States he loves sweets. He has not been checking his blood sugars but MD increased his Metformin to BID. Patient has an 8% wt loss over one year which is not significant.  Nutrition Diagnosis: Inadequate oral intake related to carcinoid tumor and associated treatments as evidenced by calories consumed less than estimated needs.  Intervention: Educated to increase calories and protein in small frequent meals and snacks throughout the day. Focus on Protein at every meal or snack. Consider adding milk to meals BID. Try Fairlife Milk. (Ultra filtered, lower sugar, higher protein) Consider Glucerna supplements BID. Provided information. Reviewed no concentrated sweets diet. Explained the importance of blood sugar control. Recommend he test blood sugars and communicate to MD. Provided nutrition fact sheets and contact information.  Monitoring, Evaluation, Goals: Increase calories and protein and limit concentrated sweets for better glycemic control and wt maintenance.  Next Visit: Patient will contact me with questions or concerns.

## 2021-03-22 ENCOUNTER — Other Ambulatory Visit (HOSPITAL_COMMUNITY): Payer: Self-pay

## 2021-03-24 ENCOUNTER — Other Ambulatory Visit (HOSPITAL_COMMUNITY): Payer: Self-pay

## 2021-03-24 ENCOUNTER — Other Ambulatory Visit: Payer: Self-pay | Admitting: Oncology

## 2021-03-24 DIAGNOSIS — C7A090 Malignant carcinoid tumor of the bronchus and lung: Secondary | ICD-10-CM

## 2021-03-25 ENCOUNTER — Encounter: Payer: Self-pay | Admitting: Oncology

## 2021-03-25 ENCOUNTER — Other Ambulatory Visit (HOSPITAL_COMMUNITY): Payer: Self-pay

## 2021-03-25 MED FILL — Everolimus Tab 7.5 MG: ORAL | 30 days supply | Qty: 30 | Fill #0 | Status: AC

## 2021-03-31 ENCOUNTER — Inpatient Hospital Stay: Payer: Medicare Other

## 2021-04-01 ENCOUNTER — Other Ambulatory Visit (HOSPITAL_COMMUNITY): Payer: Self-pay

## 2021-04-05 ENCOUNTER — Other Ambulatory Visit (HOSPITAL_COMMUNITY): Payer: Self-pay

## 2021-04-07 ENCOUNTER — Inpatient Hospital Stay: Payer: Medicare Other | Attending: Oncology

## 2021-04-07 ENCOUNTER — Inpatient Hospital Stay: Payer: Medicare Other

## 2021-04-07 ENCOUNTER — Other Ambulatory Visit: Payer: Self-pay

## 2021-04-07 VITALS — BP 134/75 | HR 67 | Temp 98.7°F | Resp 20 | Ht 73.0 in | Wt 180.2 lb

## 2021-04-07 DIAGNOSIS — C7B03 Secondary carcinoid tumors of bone: Secondary | ICD-10-CM | POA: Insufficient documentation

## 2021-04-07 DIAGNOSIS — C7A090 Malignant carcinoid tumor of the bronchus and lung: Secondary | ICD-10-CM | POA: Diagnosis present

## 2021-04-07 DIAGNOSIS — C7B02 Secondary carcinoid tumors of liver: Secondary | ICD-10-CM | POA: Diagnosis present

## 2021-04-07 LAB — CBC WITH DIFFERENTIAL (CANCER CENTER ONLY)
Abs Immature Granulocytes: 0.01 10*3/uL (ref 0.00–0.07)
Basophils Absolute: 0 10*3/uL (ref 0.0–0.1)
Basophils Relative: 1 %
Eosinophils Absolute: 0.1 10*3/uL (ref 0.0–0.5)
Eosinophils Relative: 2 %
HCT: 30.6 % — ABNORMAL LOW (ref 39.0–52.0)
Hemoglobin: 10.9 g/dL — ABNORMAL LOW (ref 13.0–17.0)
Immature Granulocytes: 0 %
Lymphocytes Relative: 33 %
Lymphs Abs: 1 10*3/uL (ref 0.7–4.0)
MCH: 29.1 pg (ref 26.0–34.0)
MCHC: 35.6 g/dL (ref 30.0–36.0)
MCV: 81.8 fL (ref 80.0–100.0)
Monocytes Absolute: 0.2 10*3/uL (ref 0.1–1.0)
Monocytes Relative: 8 %
Neutro Abs: 1.7 10*3/uL (ref 1.7–7.7)
Neutrophils Relative %: 56 %
Platelet Count: 77 10*3/uL — ABNORMAL LOW (ref 150–400)
RBC: 3.74 MIL/uL — ABNORMAL LOW (ref 4.22–5.81)
RDW: 13.4 % (ref 11.5–15.5)
WBC Count: 3.1 10*3/uL — ABNORMAL LOW (ref 4.0–10.5)
nRBC: 0 % (ref 0.0–0.2)

## 2021-04-07 MED ORDER — OCTREOTIDE ACETATE 30 MG IM KIT
30.0000 mg | PACK | Freq: Once | INTRAMUSCULAR | Status: AC
  Administered 2021-04-07: 30 mg via INTRAMUSCULAR
  Filled 2021-04-07: qty 1

## 2021-04-07 NOTE — Patient Instructions (Signed)
[facility]   Discharge Instructions: Thank you for choosing [facility] to provide your oncology and hematology care.   If you have a lab appointment with the [facility], please go directly to the [facility] and check in at the registration area.   Wear comfortable clothing and clothing appropriate for easy access to any Portacath or PICC line.   We strive to give you quality time with your provider. You may need to reschedule your appointment if you arrive late (15 or more minutes).  Arriving late affects you and other patients whose appointments are after yours.  Also, if you miss three or more appointments without notifying the office, you may be dismissed from the clinic at the providers discretion.      For prescription refill requests, have your pharmacy contact our office and allow 72 hours for refills to be completed.    Today you received the following chemotherapy and/or immunotherapy agents Sandostatin      To help prevent nausea and vomiting after your treatment, we encourage you to take your nausea medication as directed.  BELOW ARE SYMPTOMS THAT SHOULD BE REPORTED IMMEDIATELY: *FEVER GREATER THAN 100.4 F (38 C) OR HIGHER *CHILLS OR SWEATING *NAUSEA AND VOMITING THAT IS NOT CONTROLLED WITH YOUR NAUSEA MEDICATION *UNUSUAL SHORTNESS OF BREATH *UNUSUAL BRUISING OR BLEEDING *URINARY PROBLEMS (pain or burning when urinating, or frequent urination) *BOWEL PROBLEMS (unusual diarrhea, constipation, pain near the anus) TENDERNESS IN MOUTH AND THROAT WITH OR WITHOUT PRESENCE OF ULCERS (sore throat, sores in mouth, or a toothache) UNUSUAL RASH, SWELLING OR PAIN  UNUSUAL VAGINAL DISCHARGE OR ITCHING   Items with * indicate a potential emergency and should be followed up as soon as possible or go to the Emergency Department if any problems should occur.  Please show the CHEMOTHERAPY ALERT CARD or IMMUNOTHERAPY ALERT CARD at check-in to  the Emergency Department and triage nurse.  Should you have questions after your visit or need to cancel or reschedule your appointment, please contact [facility]  Dept: [phone]  and follow the prompts.  Office hours are 8:00 a.m. to 4:30 p.m. Monday - Friday. Please note that voicemails left after 4:00 p.m. may not be returned until the following business day.  We are closed weekends and major holidays. You have access to a nurse at all times for urgent questions. Please call the main number to the clinic Dept: [phone] and follow the prompts.   For any non-urgent questions, you may also contact your provider using MyChart. We now offer e-Visits for anyone 24 and older to request care online for non-urgent symptoms. For details visit [email or web address].   Also download the MyChart app! Go to the app store, search "MyChart", open the app, select [facility], and log in with your MyChart username and password.  Due to Covid, a mask is required upon entering the hospital/clinic. If you do not have a mask, one will be given to you upon arrival. For doctor visits, patients may have 1 support person aged 56 or older with them. For treatment visits, patients cannot have anyone with them due to current Covid guidelines and our immunocompromised population.   Octreotide injection solution What is this medication? OCTREOTIDE (ok TREE oh tide) is used to reduce blood levels of growth hormone in patients with a condition called acromegaly. This medicine also reduces flushing and watery diarrhea caused by certain types of cancer. This medicine may be used for  other purposes; ask your health care provider or pharmacist if you have questions. COMMON BRAND NAME(S): Leatha Gilding, Sandostatin What should I tell my care team before I take this medication? They need to know if you have any of these conditions: diabetes gallbladder disease kidney disease liver disease thyroid  disease an unusual or allergic reaction to octreotide, other medicines, foods, dyes, or preservatives pregnant or trying to get pregnant breast-feeding How should I use this medication? This medicine is for injection under the skin or into a vein (only in emergency situations). It is usually given by a health care professional in a hospital or clinic setting. If you get this medicine at home, you will be taught how to prepare and give this medicine. Allow the injection solution to come to room temperature before use. Do not warm it artificially. Use exactly as directed. Take your medicine at regular intervals. Do not take your medicine more often than directed. It is important that you put your used needles and syringes in a special sharps container. Do not put them in a trash can. If you do not have a sharps container, call your pharmacist or healthcare provider to get one. Talk to your pediatrician regarding the use of this medicine in children. Special care may be needed. Overdosage: If you think you have taken too much of this medicine contact a poison control center or emergency room at once. NOTE: This medicine is only for you. Do not share this medicine with others. What if I miss a dose? If you miss a dose, take it as soon as you can. If it is almost time for your next dose, take only that dose. Do not take double or extra doses. What may interact with this medication? bromocriptine certain medicines for blood pressure, heart disease, irregular heartbeat cyclosporine diuretics medicines for diabetes, including insulin quinidine This list may not describe all possible interactions. Give your health care provider a list of all the medicines, herbs, non-prescription drugs, or dietary supplements you use. Also tell them if you smoke, drink alcohol, or use illegal drugs. Some items may interact with your medicine. What should I watch for while using this medication? Visit your doctor or  health care professional for regular checks on your progress. To help reduce irritation at the injection site, use a different site for each injection and make sure the solution is at room temperature before use. This medicine may cause decreases in blood sugar. Signs of low blood sugar include chills, cool, pale skin or cold sweats, drowsiness, extreme hunger, fast heartbeat, headache, nausea, nervousness or anxiety, shakiness, trembling, unsteadiness, tiredness, or weakness. Contact your doctor or health care professional right away if you experience any of these symptoms. This medicine may increase blood sugar. Ask your healthcare provider if changes in diet or medicines are needed if you have diabetes. This medicine may cause a decrease in vitamin B12. You should make sure that you get enough vitamin B12 while you are taking this medicine. Discuss the foods you eat and the vitamins you take with your health care professional. What side effects may I notice from receiving this medication? Side effects that you should report to your doctor or health care professional as soon as possible: allergic reactions like skin rash, itching or hives, swelling of the face, lips, or tongue fast, slow, or irregular heartbeat right upper belly pain severe stomach pain signs and symptoms of high blood sugar such as being more thirsty or hungry or having to urinate more  than normal. You may also feel very tired or have blurry vision. signs and symptoms of low blood sugar such as feeling anxious; confusion; dizziness; increased hunger; unusually weak or tired; increased sweating; shakiness; cold, clammy skin; irritable; headache; blurred vision; fast heartbeat; loss of consciousness unusually weak or tired Side effects that usually do not require medical attention (report to your doctor or health care professional if they continue or are bothersome): diarrhea dizziness gas headache nausea, vomiting pain,  redness, or irritation at site where injected upset stomach This list may not describe all possible side effects. Call your doctor for medical advice about side effects. You may report side effects to FDA at 1-800-FDA-1088. Where should I keep my medication? Keep out of the reach of children. Store in a refrigerator between 2 and 8 degrees C (36 and 46 degrees F). Protect from light. Allow to come to room temperature naturally. Do not use artificial heat. If protected from light, the injection may be stored at room temperature between 20 and 30 degrees C (70 and 86 degrees F) for 14 days. After the initial use, throw away any unused portion of a multiple dose vial after 14 days. Throw away unused portions of the ampules after use. NOTE: This sheet is a summary. It may not cover all possible information. If you have questions about this medicine, talk to your doctor, pharmacist, or health care provider.  2022 Elsevier/Gold Standard (2018-09-13 00:00:00)

## 2021-04-26 ENCOUNTER — Other Ambulatory Visit (HOSPITAL_COMMUNITY): Payer: Self-pay

## 2021-04-28 ENCOUNTER — Other Ambulatory Visit (HOSPITAL_COMMUNITY): Payer: Self-pay

## 2021-04-29 ENCOUNTER — Other Ambulatory Visit: Payer: Medicare Other

## 2021-04-29 ENCOUNTER — Ambulatory Visit: Payer: Medicare Other | Admitting: Oncology

## 2021-04-29 ENCOUNTER — Ambulatory Visit: Payer: Medicare Other

## 2021-05-10 ENCOUNTER — Other Ambulatory Visit (HOSPITAL_COMMUNITY): Payer: Self-pay

## 2021-05-10 MED FILL — Everolimus Tab 7.5 MG: ORAL | 30 days supply | Qty: 30 | Fill #1 | Status: AC

## 2021-05-11 ENCOUNTER — Inpatient Hospital Stay: Payer: Medicare Other

## 2021-05-11 ENCOUNTER — Inpatient Hospital Stay: Payer: Medicare Other | Attending: Oncology

## 2021-05-11 ENCOUNTER — Inpatient Hospital Stay (HOSPITAL_BASED_OUTPATIENT_CLINIC_OR_DEPARTMENT_OTHER): Payer: Medicare Other | Admitting: Oncology

## 2021-05-11 ENCOUNTER — Other Ambulatory Visit: Payer: Self-pay

## 2021-05-11 ENCOUNTER — Other Ambulatory Visit (HOSPITAL_COMMUNITY): Payer: Self-pay

## 2021-05-11 VITALS — BP 159/75 | HR 89 | Temp 97.9°F | Resp 20 | Ht 73.0 in | Wt 180.2 lb

## 2021-05-11 DIAGNOSIS — C7B02 Secondary carcinoid tumors of liver: Secondary | ICD-10-CM | POA: Insufficient documentation

## 2021-05-11 DIAGNOSIS — C7A090 Malignant carcinoid tumor of the bronchus and lung: Secondary | ICD-10-CM

## 2021-05-11 DIAGNOSIS — C7B03 Secondary carcinoid tumors of bone: Secondary | ICD-10-CM | POA: Insufficient documentation

## 2021-05-11 LAB — CMP (CANCER CENTER ONLY)
ALT: 29 U/L (ref 0–44)
AST: 26 U/L (ref 15–41)
Albumin: 4.4 g/dL (ref 3.5–5.0)
Alkaline Phosphatase: 99 U/L (ref 38–126)
Anion gap: 9 (ref 5–15)
BUN: 19 mg/dL (ref 8–23)
CO2: 24 mmol/L (ref 22–32)
Calcium: 9.1 mg/dL (ref 8.9–10.3)
Chloride: 102 mmol/L (ref 98–111)
Creatinine: 1.22 mg/dL (ref 0.61–1.24)
GFR, Estimated: >60 mL/min (ref >60)
Glucose, Bld: 363 mg/dL — ABNORMAL HIGH (ref 70–99)
Potassium: 4 mmol/L (ref 3.5–5.1)
Sodium: 135 mmol/L (ref 135–145)
Total Bilirubin: 1.2 mg/dL (ref 0.3–1.2)
Total Protein: 7.2 g/dL (ref 6.5–8.1)

## 2021-05-11 LAB — CBC WITH DIFFERENTIAL (CANCER CENTER ONLY)
Abs Immature Granulocytes: 0.02 10*3/uL (ref 0.00–0.07)
Basophils Absolute: 0 10*3/uL (ref 0.0–0.1)
Basophils Relative: 0 %
Eosinophils Absolute: 0.1 10*3/uL (ref 0.0–0.5)
Eosinophils Relative: 1 %
HCT: 32.1 % — ABNORMAL LOW (ref 39.0–52.0)
Hemoglobin: 11.6 g/dL — ABNORMAL LOW (ref 13.0–17.0)
Immature Granulocytes: 1 %
Lymphocytes Relative: 21 %
Lymphs Abs: 0.7 10*3/uL (ref 0.7–4.0)
MCH: 29.4 pg (ref 26.0–34.0)
MCHC: 36.1 g/dL — ABNORMAL HIGH (ref 30.0–36.0)
MCV: 81.3 fL (ref 80.0–100.0)
Monocytes Absolute: 0.2 10*3/uL (ref 0.1–1.0)
Monocytes Relative: 6 %
Neutro Abs: 2.4 10*3/uL (ref 1.7–7.7)
Neutrophils Relative %: 71 %
Platelet Count: 88 10*3/uL — ABNORMAL LOW (ref 150–400)
RBC: 3.95 MIL/uL — ABNORMAL LOW (ref 4.22–5.81)
RDW: 13.7 % (ref 11.5–15.5)
WBC Count: 3.5 10*3/uL — ABNORMAL LOW (ref 4.0–10.5)
nRBC: 0 % (ref 0.0–0.2)

## 2021-05-11 MED ORDER — OCTREOTIDE ACETATE 30 MG IM KIT
30.0000 mg | PACK | Freq: Once | INTRAMUSCULAR | Status: AC
  Administered 2021-05-11: 30 mg via INTRAMUSCULAR
  Filled 2021-05-11: qty 1

## 2021-05-11 NOTE — Patient Instructions (Signed)
Octreotide injection solution What is this medication? OCTREOTIDE (ok TREE oh tide) is used to reduce blood levels of growth hormone in patients with a condition called acromegaly. This medicine also reduces flushing and watery diarrhea caused by certain types of cancer. This medicine may be used for other purposes; ask your health care provider or pharmacist if you have questions. COMMON BRAND NAME(S): Bynfezia, Sandostatin What should I tell my care team before I take this medication? They need to know if you have any of these conditions: diabetes gallbladder disease kidney disease liver disease thyroid disease an unusual or allergic reaction to octreotide, other medicines, foods, dyes, or preservatives pregnant or trying to get pregnant breast-feeding How should I use this medication? This medicine is for injection under the skin or into a vein (only in emergency situations). It is usually given by a health care professional in a hospital or clinic setting. If you get this medicine at home, you will be taught how to prepare and give this medicine. Allow the injection solution to come to room temperature before use. Do not warm it artificially. Use exactly as directed. Take your medicine at regular intervals. Do not take your medicine more often than directed. It is important that you put your used needles and syringes in a special sharps container. Do not put them in a trash can. If you do not have a sharps container, call your pharmacist or healthcare provider to get one. Talk to your pediatrician regarding the use of this medicine in children. Special care may be needed. Overdosage: If you think you have taken too much of this medicine contact a poison control center or emergency room at once. NOTE: This medicine is only for you. Do not share this medicine with others. What if I miss a dose? If you miss a dose, take it as soon as you can. If it is almost time for your next dose, take only  that dose. Do not take double or extra doses. What may interact with this medication? bromocriptine certain medicines for blood pressure, heart disease, irregular heartbeat cyclosporine diuretics medicines for diabetes, including insulin quinidine This list may not describe all possible interactions. Give your health care provider a list of all the medicines, herbs, non-prescription drugs, or dietary supplements you use. Also tell them if you smoke, drink alcohol, or use illegal drugs. Some items may interact with your medicine. What should I watch for while using this medication? Visit your doctor or health care professional for regular checks on your progress. To help reduce irritation at the injection site, use a different site for each injection and make sure the solution is at room temperature before use. This medicine may cause decreases in blood sugar. Signs of low blood sugar include chills, cool, pale skin or cold sweats, drowsiness, extreme hunger, fast heartbeat, headache, nausea, nervousness or anxiety, shakiness, trembling, unsteadiness, tiredness, or weakness. Contact your doctor or health care professional right away if you experience any of these symptoms. This medicine may increase blood sugar. Ask your healthcare provider if changes in diet or medicines are needed if you have diabetes. This medicine may cause a decrease in vitamin B12. You should make sure that you get enough vitamin B12 while you are taking this medicine. Discuss the foods you eat and the vitamins you take with your health care professional. What side effects may I notice from receiving this medication? Side effects that you should report to your doctor or health care professional as soon as   possible: allergic reactions like skin rash, itching or hives, swelling of the face, lips, or tongue fast, slow, or irregular heartbeat right upper belly pain severe stomach pain signs and symptoms of high blood sugar such  as being more thirsty or hungry or having to urinate more than normal. You may also feel very tired or have blurry vision. signs and symptoms of low blood sugar such as feeling anxious; confusion; dizziness; increased hunger; unusually weak or tired; increased sweating; shakiness; cold, clammy skin; irritable; headache; blurred vision; fast heartbeat; loss of consciousness unusually weak or tired Side effects that usually do not require medical attention (report to your doctor or health care professional if they continue or are bothersome): diarrhea dizziness gas headache nausea, vomiting pain, redness, or irritation at site where injected upset stomach This list may not describe all possible side effects. Call your doctor for medical advice about side effects. You may report side effects to FDA at 1-800-FDA-1088. Where should I keep my medication? Keep out of the reach of children. Store in a refrigerator between 2 and 8 degrees C (36 and 46 degrees F). Protect from light. Allow to come to room temperature naturally. Do not use artificial heat. If protected from light, the injection may be stored at room temperature between 20 and 30 degrees C (70 and 86 degrees F) for 14 days. After the initial use, throw away any unused portion of a multiple dose vial after 14 days. Throw away unused portions of the ampules after use. NOTE: This sheet is a summary. It may not cover all possible information. If you have questions about this medicine, talk to your doctor, pharmacist, or health care provider.  2022 [employer]/Gold Standard (2018-09-13 00:00:00)  

## 2021-05-11 NOTE — Progress Notes (Signed)
?  [facility] ?OFFICE PROGRESS NOTE ? ? ?Diagnosis: Carcinoid tumor ? ?INTERVAL HISTORY:  ? ?Michael Good returns as scheduled.  He continues monthly Sandostatin.  He continues everolimus.  No mouth sores, diarrhea, or pain.  He reports a poor appetite.  He recently started Ensure supplements.  No nausea. ? ?Objective: ? ?Vital signs in last 24 hours: ? ?Blood pressure (!) 159/75, pulse 89, temperature 97.9 ?F (36.6 ?C), temperature source Oral, resp. rate 20, height $RemoveBe'6\' 1"'XFRdvBIdu$  (1.854 m), weight 180 lb 3.2 oz (81.7 kg), SpO2 100 %. ?  ? ?HEENT: 1-2 mm ulcer at the posterior right buccal mucosa, no thrus ?Resp: Lungs clear bilaterally ?Cardio: Regular rate and rhythm ?GI: Nontender, no hepatosplenomegaly, no mass, reducible ventral hernia ?Vascular: No leg edema ? ? ?Lab Results: ? ?Lab Results  ?Component Value Date  ? WBC 3.5 (L) 05/11/2021  ? HGB 11.6 (L) 05/11/2021  ? HCT 32.1 (L) 05/11/2021  ? MCV 81.3 05/11/2021  ? PLT 88 (L) 05/11/2021  ? NEUTROABS 2.4 05/11/2021  ? ? ?CMP  ?Lab Results  ?Component Value Date  ? NA 137 02/01/2021  ? K 3.8 02/01/2021  ? CL 101 02/01/2021  ? CO2 28 02/01/2021  ? GLUCOSE 339 (H) 02/01/2021  ? BUN 15 02/01/2021  ? CREATININE 1.03 02/01/2021  ? CALCIUM 9.0 02/01/2021  ? PROT 7.0 02/01/2021  ? ALBUMIN 4.5 02/01/2021  ? AST 21 02/01/2021  ? ALT 24 02/01/2021  ? ALKPHOS 116 02/01/2021  ? BILITOT 1.3 (H) 02/01/2021  ? GFRNONAA >60 02/01/2021  ? ? ? ?Medications: I have reviewed the patient's current medications. ? ? ?Assessment/Plan: ?Metastatic carcinoid tumor ?Left lower lobectomy February 2004 at [facility], 2.2 cm typical carcinoid tumor ?Laparoscopic cholecystectomy November 2014, imaging revealed a 4.4 cm segment 8 liver lesion ?03/18/2013-central hepatectomy and drainage of intra-abdominal abscess,- low-grade neuroendocrine carcinoma, tumor appeared totally excised,Ki-67 less than 5% ?CT's 05/09/2014, 08/16/2016, 09/19/2017-no evidence of recurrent disease ?CTs 914 2020-3 new liver  lesions ?12/26/2018 PET dotatate scan-at least 20 liver lesions and extensive bone metastases, hypermetabolic pancreas tail lesion 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 ?01/16/2019-biopsy of the left hepatic lobe lesion-metastatic neuroendocrine carcinoma,Ki-67 10%- interpretation limited due to scant remaining tumor may not be representative of the overall lesion, mismatch repair protein staining intact ?02/27/2019-Sandostatin 30 mg every 4 weeks ?09/18/2019- Netspot PET-increased activity in the pancreas tail and liver and bone metastases ?Late August 2021-everolimus, 7.5 mg daily, placed on hold September 2021 due to acute hyperglycemia/new onset diabetes ?01/22/2020-everolimus resumed ?Netspot 09/29/2020-innumerable liver metastases, unchanged, stable tracer uptake in distal pancreas lesion, innumerable bone lesions, significant decrease in tracer uptake in left humeral lesion, mild decrease in FDG uptake in other bone lesions, no new site of metastatic disease ?Diabetes ?ERCP removal of bile duct stones March 2015, March 2018 ?Sleep apnea ?Hypertension ?COVID-19 infection September 2021 ?Cholecystectomy November 2014 ?Umbilical hernia repair 2/44/0102 ?Erectile dysfunction ?Chronic thrombocytopenia ? ? ? ?Disposition: ?Michael Good appears unchanged.  He reports anorexia.  His weight is stable over the past month.  Encouraged him to continue the use of nutrition supplements.  The plan is to continue monthly Sandostatin.  He will continue everolimus.  He will return for an office visit in 3 months.  We will refer him for restaging studies if he continues to lose weight. ? ?Betsy Coder, MD ? ?05/11/2021  ?3:36 PM ? ? ?

## 2021-05-12 ENCOUNTER — Other Ambulatory Visit: Payer: Self-pay | Admitting: *Deleted

## 2021-05-12 DIAGNOSIS — C7B8 Other secondary neuroendocrine tumors: Secondary | ICD-10-CM

## 2021-05-12 NOTE — Progress Notes (Signed)
Recent weight loss referral for nutrition made ?

## 2021-05-13 LAB — CHROMOGRANIN A: Chromogranin A (ng/mL): 2497 ng/mL — ABNORMAL HIGH (ref 0.0–101.8)

## 2021-05-17 ENCOUNTER — Telehealth: Payer: Self-pay | Admitting: Nutrition

## 2021-05-17 NOTE — Telephone Encounter (Signed)
Received request from RN to contact patient for nutrition follow-up.  Attempted to call patient today regarding poor appetite.  Patient was unavailable but I was able to leave a message with my name and phone number for return call. ?

## 2021-06-03 ENCOUNTER — Other Ambulatory Visit (HOSPITAL_COMMUNITY): Payer: Self-pay

## 2021-06-03 ENCOUNTER — Other Ambulatory Visit: Payer: Self-pay | Admitting: Oncology

## 2021-06-03 DIAGNOSIS — C7A090 Malignant carcinoid tumor of the bronchus and lung: Secondary | ICD-10-CM

## 2021-06-03 MED ORDER — EVEROLIMUS 7.5 MG PO TABS
ORAL_TABLET | Freq: Every day | ORAL | 1 refills | Status: DC
  Filled 2021-06-03: qty 30, 30d supply, fill #0
  Filled 2021-06-28: qty 30, 30d supply, fill #1

## 2021-06-09 ENCOUNTER — Other Ambulatory Visit: Payer: Medicare Other

## 2021-06-09 ENCOUNTER — Other Ambulatory Visit (HOSPITAL_COMMUNITY): Payer: Self-pay

## 2021-06-09 ENCOUNTER — Inpatient Hospital Stay: Payer: Medicare Other | Attending: Oncology

## 2021-06-09 VITALS — BP 139/76 | HR 70 | Temp 98.4°F | Resp 20 | Ht 73.0 in | Wt 181.4 lb

## 2021-06-09 DIAGNOSIS — C7B03 Secondary carcinoid tumors of bone: Secondary | ICD-10-CM | POA: Insufficient documentation

## 2021-06-09 DIAGNOSIS — C7B02 Secondary carcinoid tumors of liver: Secondary | ICD-10-CM | POA: Diagnosis present

## 2021-06-09 DIAGNOSIS — C7A090 Malignant carcinoid tumor of the bronchus and lung: Secondary | ICD-10-CM

## 2021-06-09 MED ORDER — OCTREOTIDE ACETATE 30 MG IM KIT
30.0000 mg | PACK | Freq: Once | INTRAMUSCULAR | Status: AC
  Administered 2021-06-09: 30 mg via INTRAMUSCULAR
  Filled 2021-06-09: qty 1

## 2021-06-09 NOTE — Patient Instructions (Signed)
Octreotide injection solution What is this medication? OCTREOTIDE (ok TREE oh tide) is used to reduce blood levels of growth hormone in patients with a condition called acromegaly. This medicine also reduces flushing and watery diarrhea caused by certain types of cancer. This medicine may be used for other purposes; ask your health care provider or pharmacist if you have questions. COMMON BRAND NAME(S): Bynfezia, Sandostatin What should I tell my care team before I take this medication? They need to know if you have any of these conditions: diabetes gallbladder disease kidney disease liver disease thyroid disease an unusual or allergic reaction to octreotide, other medicines, foods, dyes, or preservatives pregnant or trying to get pregnant breast-feeding How should I use this medication? This medicine is for injection under the skin or into a vein (only in emergency situations). It is usually given by a health care professional in a hospital or clinic setting. If you get this medicine at home, you will be taught how to prepare and give this medicine. Allow the injection solution to come to room temperature before use. Do not warm it artificially. Use exactly as directed. Take your medicine at regular intervals. Do not take your medicine more often than directed. It is important that you put your used needles and syringes in a special sharps container. Do not put them in a trash can. If you do not have a sharps container, call your pharmacist or healthcare provider to get one. Talk to your pediatrician regarding the use of this medicine in children. Special care may be needed. Overdosage: If you think you have taken too much of this medicine contact a poison control center or emergency room at once. NOTE: This medicine is only for you. Do not share this medicine with others. What if I miss a dose? If you miss a dose, take it as soon as you can. If it is almost time for your next dose, take only  that dose. Do not take double or extra doses. What may interact with this medication? bromocriptine certain medicines for blood pressure, heart disease, irregular heartbeat cyclosporine diuretics medicines for diabetes, including insulin quinidine This list may not describe all possible interactions. Give your health care provider a list of all the medicines, herbs, non-prescription drugs, or dietary supplements you use. Also tell them if you smoke, drink alcohol, or use illegal drugs. Some items may interact with your medicine. What should I watch for while using this medication? Visit your doctor or health care professional for regular checks on your progress. To help reduce irritation at the injection site, use a different site for each injection and make sure the solution is at room temperature before use. This medicine may cause decreases in blood sugar. Signs of low blood sugar include chills, cool, pale skin or cold sweats, drowsiness, extreme hunger, fast heartbeat, headache, nausea, nervousness or anxiety, shakiness, trembling, unsteadiness, tiredness, or weakness. Contact your doctor or health care professional right away if you experience any of these symptoms. This medicine may increase blood sugar. Ask your healthcare provider if changes in diet or medicines are needed if you have diabetes. This medicine may cause a decrease in vitamin B12. You should make sure that you get enough vitamin B12 while you are taking this medicine. Discuss the foods you eat and the vitamins you take with your health care professional. What side effects may I notice from receiving this medication? Side effects that you should report to your doctor or health care professional as soon as   possible: allergic reactions like skin rash, itching or hives, swelling of the face, lips, or tongue fast, slow, or irregular heartbeat right upper belly pain severe stomach pain signs and symptoms of high blood sugar such  as being more thirsty or hungry or having to urinate more than normal. You may also feel very tired or have blurry vision. signs and symptoms of low blood sugar such as feeling anxious; confusion; dizziness; increased hunger; unusually weak or tired; increased sweating; shakiness; cold, clammy skin; irritable; headache; blurred vision; fast heartbeat; loss of consciousness unusually weak or tired Side effects that usually do not require medical attention (report to your doctor or health care professional if they continue or are bothersome): diarrhea dizziness gas headache nausea, vomiting pain, redness, or irritation at site where injected upset stomach This list may not describe all possible side effects. Call your doctor for medical advice about side effects. You may report side effects to FDA at 1-800-FDA-1088. Where should I keep my medication? Keep out of the reach of children. Store in a refrigerator between 2 and 8 degrees C (36 and 46 degrees F). Protect from light. Allow to come to room temperature naturally. Do not use artificial heat. If protected from light, the injection may be stored at room temperature between 20 and 30 degrees C (70 and 86 degrees F) for 14 days. After the initial use, throw away any unused portion of a multiple dose vial after 14 days. Throw away unused portions of the ampules after use. NOTE: This sheet is a summary. It may not cover all possible information. If you have questions about this medicine, talk to your doctor, pharmacist, or health care provider.  2022 [employer]/Gold Standard (2018-09-13 00:00:00)  

## 2021-06-17 ENCOUNTER — Encounter: Payer: Self-pay | Admitting: Oncology

## 2021-06-28 ENCOUNTER — Other Ambulatory Visit (HOSPITAL_COMMUNITY): Payer: Self-pay

## 2021-07-02 ENCOUNTER — Other Ambulatory Visit (HOSPITAL_COMMUNITY): Payer: Self-pay

## 2021-07-07 ENCOUNTER — Other Ambulatory Visit: Payer: Medicare Other

## 2021-07-07 ENCOUNTER — Inpatient Hospital Stay: Payer: Medicare Other | Attending: Oncology

## 2021-07-07 VITALS — BP 135/78 | HR 69 | Temp 98.4°F | Resp 18

## 2021-07-07 DIAGNOSIS — C7A090 Malignant carcinoid tumor of the bronchus and lung: Secondary | ICD-10-CM

## 2021-07-07 DIAGNOSIS — C7B02 Secondary carcinoid tumors of liver: Secondary | ICD-10-CM | POA: Diagnosis present

## 2021-07-07 DIAGNOSIS — C7B03 Secondary carcinoid tumors of bone: Secondary | ICD-10-CM | POA: Diagnosis present

## 2021-07-07 MED ORDER — OCTREOTIDE ACETATE 30 MG IM KIT
30.0000 mg | PACK | Freq: Once | INTRAMUSCULAR | Status: AC
  Administered 2021-07-07: 30 mg via INTRAMUSCULAR
  Filled 2021-07-07: qty 1

## 2021-07-07 NOTE — Patient Instructions (Signed)
Octreotide injection solution ?What is this medication? ?OCTREOTIDE (ok TREE oh tide) is used to reduce blood levels of growth hormone in patients with a condition called acromegaly. This medicine also reduces flushing and watery diarrhea caused by certain types of cancer. ?This medicine may be used for other purposes; ask your health care provider or pharmacist if you have questions. ?COMMON BRAND NAME(S): Bynfezia, Sandostatin ?What should I tell my care team before I take this medication? ?They need to know if you have any of these conditions: ?diabetes ?gallbladder disease ?kidney disease ?liver disease ?thyroid disease ?an unusual or allergic reaction to octreotide, other medicines, foods, dyes, or preservatives ?pregnant or trying to get pregnant ?breast-feeding ?How should I use this medication? ?This medication is injected under the skin or into a vein. It is usually given by your care team in a hospital or clinic setting. ?If you get this medication at home, you will be taught how to prepare and give it. Use exactly as directed. Take it as directed on the prescription label at the same time every day. Keep taking it unless your care team tells you to stop. ?Allow the injection solution to come to room temperature before use. Do not warm it artificially. ?It is important that you put your used needles and syringes in a special sharps container. Do not put them in a trash can. If you do not have a sharps container, call your pharmacist or care team to get one. ?Talk to your care team about the use of this medication in children. Special care may be needed. ?Overdosage: If you think you have taken too much of this medicine contact a poison control center or emergency room at once. ?NOTE: This medicine is only for you. Do not share this medicine with others. ?What if I miss a dose? ?If you miss a dose, take it as soon as you can. If it is almost time for your next dose, take only that dose. Do not take double  or extra doses. ?What may interact with this medication? ?bromocriptine ?certain medicines for blood pressure, heart disease, irregular heartbeat ?cyclosporine ?diuretics ?medicines for diabetes, including insulin ?quinidine ?This list may not describe all possible interactions. Give your health care provider a list of all the medicines, herbs, non-prescription drugs, or dietary supplements you use. Also tell them if you smoke, drink alcohol, or use illegal drugs. Some items may interact with your medicine. ?What should I watch for while using this medication? ?Visit your care team for regular checks on your progress. Tell your care team if your symptoms do not start to get better or if they get worse. ?To help reduce irritation at the injection site, use a different site for each injection and make sure the solution is at room temperature before use. ?This medication may cause decreases in blood sugar. Signs of low blood sugar include chills, cool, pale skin or cold sweats, drowsiness, extreme hunger, fast heartbeat, headache, nausea, nervousness or anxiety, shakiness, trembling, unsteadiness, tiredness, or weakness. Contact your care team right away if you experience any of these symptoms. ?This medication may increase blood sugar. The risk may be higher in patients who already have diabetes. Ask your care team what you can do to lower your risk of diabetes while taking this medication. ?You should make sure you get enough vitamin B12 while you are taking this medication. Discuss the foods you eat and the vitamins you take with your care team. ?What side effects may I notice from receiving   this medication? ?Side effects that you should report to your doctor or health care professional as soon as possible: ?allergic reactions like skin rash, itching or hives, swelling of the face, lips, or tongue ?fast, slow, or irregular heartbeat ?right upper belly pain ?severe stomach pain ?signs and symptoms of high blood sugar  such as being more thirsty or hungry or having to urinate more than normal. You may also feel very tired or have blurry vision. ?signs and symptoms of low blood sugar such as feeling anxious; confusion; dizziness; increased hunger; unusually weak or tired; increased sweating; shakiness; cold, clammy skin; irritable; headache; blurred vision; fast heartbeat; loss of consciousness ?unusually weak or tired ?Side effects that usually do not require medical attention (report to your doctor or health care professional if they continue or are bothersome): ?diarrhea ?dizziness ?gas ?headache ?nausea, vomiting ?pain, redness, or irritation at site where injected ?upset stomach ?This list may not describe all possible side effects. Call your doctor for medical advice about side effects. You may report side effects to FDA at 1-800-FDA-1088. ?Where should I keep my medication? ?Keep out of the reach of children and pets. ?Store in the refrigerator. Protect from light. Allow to come to room temperature naturally. Do not use artificial heat. If protected from light, the injection may be stored between 20 and 30 degrees C (70 and 86 degrees F) for 14 days. After the initial use, throw away any unused portion of a multiple dose vial after 14 days. Get rid of any unused portions of the ampules after use. ?To get rid of medications that are no longer needed or have expired: ?Take the medication to a medication take-back program. Ask your pharmacy or law enforcement to find a location. ?If you cannot return the medication, ask your pharmacist or care team how to get rid of the medication safely. ?NOTE: This sheet is a summary. It may not cover all possible information. If you have questions about this medicine, talk to your doctor, pharmacist, or health care provider. ?? 2023 Elsevier/Gold Standard (2021-02-05 00:00:00) ? ?

## 2021-07-21 ENCOUNTER — Telehealth: Payer: Self-pay | Admitting: Nutrition

## 2021-07-21 NOTE — Telephone Encounter (Signed)
Telephone follow up completed with patient receiving treatment for Carcinoid Tumor and followed by Dr.Sherrill.  Weight is stable at 181 pounds, 6.4 oz.  Labs reviewed.  Patient reports he is eating ok. His appetite is not what it used to be and sometimes he can only eat 1/2 of what he used to eat. He would like to gain about 10 pounds. Reports blood sugars go up and down. He has switched from Glucerna to Ensure but doesn't know which Ensure he is drinking. Denies nausea, vomiting, diarrhea and constipation.  Nutrition Diagnosis: Inadequate oral intake stable.  Intervention: Educated to continue small amounts of food throughout the days. Encouraged blood glucose control. Encouraged wt stabilization throughout treatment.  Monitoring, Evaluation, Goals: Consume adequate calories and protein to maintain wt throughout treatment.  No follow up scheduled. Patient has contact information for questions.

## 2021-07-22 ENCOUNTER — Other Ambulatory Visit (HOSPITAL_COMMUNITY): Payer: Self-pay

## 2021-07-22 ENCOUNTER — Other Ambulatory Visit: Payer: Self-pay | Admitting: Oncology

## 2021-07-22 DIAGNOSIS — C7A090 Malignant carcinoid tumor of the bronchus and lung: Secondary | ICD-10-CM

## 2021-07-23 ENCOUNTER — Encounter: Payer: Self-pay | Admitting: Oncology

## 2021-07-23 MED ORDER — EVEROLIMUS 7.5 MG PO TABS
ORAL_TABLET | Freq: Every day | ORAL | 1 refills | Status: DC
  Filled 2021-07-27: qty 30, 30d supply, fill #0
  Filled 2021-08-30: qty 30, 30d supply, fill #1

## 2021-07-27 ENCOUNTER — Other Ambulatory Visit (HOSPITAL_COMMUNITY): Payer: Self-pay

## 2021-08-02 ENCOUNTER — Inpatient Hospital Stay: Payer: Medicare Other | Attending: Oncology

## 2021-08-02 ENCOUNTER — Other Ambulatory Visit (HOSPITAL_COMMUNITY): Payer: Self-pay

## 2021-08-02 ENCOUNTER — Encounter: Payer: Self-pay | Admitting: *Deleted

## 2021-08-02 ENCOUNTER — Inpatient Hospital Stay: Payer: Medicare Other

## 2021-08-02 ENCOUNTER — Encounter: Payer: Self-pay | Admitting: Oncology

## 2021-08-02 VITALS — BP 161/67 | HR 55 | Temp 98.1°F | Resp 18 | Ht 73.0 in | Wt 186.4 lb

## 2021-08-02 DIAGNOSIS — C7A090 Malignant carcinoid tumor of the bronchus and lung: Secondary | ICD-10-CM | POA: Insufficient documentation

## 2021-08-02 DIAGNOSIS — C7B03 Secondary carcinoid tumors of bone: Secondary | ICD-10-CM | POA: Diagnosis present

## 2021-08-02 DIAGNOSIS — C7B02 Secondary carcinoid tumors of liver: Secondary | ICD-10-CM | POA: Insufficient documentation

## 2021-08-02 LAB — CMP (CANCER CENTER ONLY)
ALT: 51 U/L — ABNORMAL HIGH (ref 0–44)
AST: 33 U/L (ref 15–41)
Albumin: 4.1 g/dL (ref 3.5–5.0)
Alkaline Phosphatase: 108 U/L (ref 38–126)
Anion gap: 8 (ref 5–15)
BUN: 15 mg/dL (ref 8–23)
CO2: 28 mmol/L (ref 22–32)
Calcium: 9.2 mg/dL (ref 8.9–10.3)
Chloride: 102 mmol/L (ref 98–111)
Creatinine: 1.1 mg/dL (ref 0.61–1.24)
GFR, Estimated: >60 mL/min (ref >60)
Glucose, Bld: 351 mg/dL — ABNORMAL HIGH (ref 70–99)
Potassium: 4.2 mmol/L (ref 3.5–5.1)
Sodium: 138 mmol/L (ref 135–145)
Total Bilirubin: 1.1 mg/dL (ref 0.3–1.2)
Total Protein: 7.2 g/dL (ref 6.5–8.1)

## 2021-08-02 LAB — CBC WITH DIFFERENTIAL (CANCER CENTER ONLY)
Abs Immature Granulocytes: 0.02 10*3/uL (ref 0.00–0.07)
Basophils Absolute: 0 10*3/uL (ref 0.0–0.1)
Basophils Relative: 0 %
Eosinophils Absolute: 0.1 10*3/uL (ref 0.0–0.5)
Eosinophils Relative: 2 %
HCT: 35.1 % — ABNORMAL LOW (ref 39.0–52.0)
Hemoglobin: 12.5 g/dL — ABNORMAL LOW (ref 13.0–17.0)
Immature Granulocytes: 1 %
Lymphocytes Relative: 20 %
Lymphs Abs: 0.6 10*3/uL — ABNORMAL LOW (ref 0.7–4.0)
MCH: 30 pg (ref 26.0–34.0)
MCHC: 35.6 g/dL (ref 30.0–36.0)
MCV: 84.2 fL (ref 80.0–100.0)
Monocytes Absolute: 0.2 10*3/uL (ref 0.1–1.0)
Monocytes Relative: 5 %
Neutro Abs: 2.2 10*3/uL (ref 1.7–7.7)
Neutrophils Relative %: 72 %
Platelet Count: 64 10*3/uL — ABNORMAL LOW (ref 150–400)
RBC: 4.17 MIL/uL — ABNORMAL LOW (ref 4.22–5.81)
RDW: 13.5 % (ref 11.5–15.5)
WBC Count: 3.1 10*3/uL — ABNORMAL LOW (ref 4.0–10.5)
nRBC: 0 % (ref 0.0–0.2)

## 2021-08-02 MED ORDER — OCTREOTIDE ACETATE 30 MG IM KIT
30.0000 mg | PACK | Freq: Once | INTRAMUSCULAR | Status: AC
  Administered 2021-08-02: 30 mg via INTRAMUSCULAR

## 2021-08-02 NOTE — Patient Instructions (Signed)
[facility]   Discharge Instructions: Thank you for choosing [facility] to provide your oncology and hematology care.   If you have a lab appointment with the [facility], please go directly to the [facility] and check in at the registration area.   Wear comfortable clothing and clothing appropriate for easy access to any Portacath or PICC line.   We strive to give you quality time with your provider. You may need to reschedule your appointment if you arrive late (15 or more minutes).  Arriving late affects you and other patients whose appointments are after yours.  Also, if you miss three or more appointments without notifying the office, you may be dismissed from the clinic at the provider's discretion.      For prescription refill requests, have your pharmacy contact our office and allow 72 hours for refills to be completed.    Today you received the following chemotherapy and/or immunotherapy agents Sandostatin       To help prevent nausea and vomiting after your treatment, we encourage you to take your nausea medication as directed.  BELOW ARE SYMPTOMS THAT SHOULD BE REPORTED IMMEDIATELY: *FEVER GREATER THAN 100.4 F (38 C) OR HIGHER *CHILLS OR SWEATING *NAUSEA AND VOMITING THAT IS NOT CONTROLLED WITH YOUR NAUSEA MEDICATION *UNUSUAL SHORTNESS OF BREATH *UNUSUAL BRUISING OR BLEEDING *URINARY PROBLEMS (pain or burning when urinating, or frequent urination) *BOWEL PROBLEMS (unusual diarrhea, constipation, pain near the anus) TENDERNESS IN MOUTH AND THROAT WITH OR WITHOUT PRESENCE OF ULCERS (sore throat, sores in mouth, or a toothache) UNUSUAL RASH, SWELLING OR PAIN  UNUSUAL VAGINAL DISCHARGE OR ITCHING   Items with * indicate a potential emergency and should be followed up as soon as possible or go to the Emergency Department if any problems should occur.  Please show the CHEMOTHERAPY ALERT CARD or IMMUNOTHERAPY ALERT CARD at check-in  to the Emergency Department and triage nurse.  Should you have questions after your visit or need to cancel or reschedule your appointment, please contact [facility]  Dept: [phone]  and follow the prompts.  Office hours are 8:00 a.m. to 4:30 p.m. Monday - Friday. Please note that voicemails left after 4:00 p.m. may not be returned until the following business day.  We are closed weekends and major holidays. You have access to a nurse at all times for urgent questions. Please call the main number to the clinic Dept: [phone] and follow the prompts.   For any non-urgent questions, you may also contact your provider using MyChart. We now offer e-Visits for anyone 85 and older to request care online for non-urgent symptoms. For details visit [email or web address].   Also download the MyChart app! Go to the app store, search "MyChart", open the app, select [facility], and log in with your MyChart username and password.  Due to Covid, a mask is required upon entering the hospital/clinic. If you do not have a mask, one will be given to you upon arrival. For doctor visits, patients may have 1 support person aged 48 or older with them. For treatment visits, patients cannot have anyone with them due to current Covid guidelines and our immunocompromised population.   Octreotide injection solution What is this medication? OCTREOTIDE (ok TREE oh tide) is used to reduce blood levels of growth hormone in patients with a condition called acromegaly. This medicine also reduces flushing and watery diarrhea caused by certain types of cancer. This medicine may be used  for other purposes; ask your health care provider or pharmacist if you have questions. COMMON BRAND NAME(S): Leatha Gilding, Sandostatin What should I tell my care team before I take this medication? They need to know if you have any of these conditions: diabetes gallbladder disease kidney disease liver  disease thyroid disease an unusual or allergic reaction to octreotide, other medicines, foods, dyes, or preservatives pregnant or trying to get pregnant breast-feeding How should I use this medication? This medication is injected under the skin or into a vein. It is usually given by your care team in a hospital or clinic setting. If you get this medication at home, you will be taught how to prepare and give it. Use exactly as directed. Take it as directed on the prescription label at the same time every day. Keep taking it unless your care team tells you to stop. Allow the injection solution to come to room temperature before use. Do not warm it artificially. It is important that you put your used needles and syringes in a special sharps container. Do not put them in a trash can. If you do not have a sharps container, call your pharmacist or care team to get one. Talk to your care team about the use of this medication in children. Special care may be needed. Overdosage: If you think you have taken too much of this medicine contact a poison control center or emergency room at once. NOTE: This medicine is only for you. Do not share this medicine with others. What if I miss a dose? If you miss a dose, take it as soon as you can. If it is almost time for your next dose, take only that dose. Do not take double or extra doses. What may interact with this medication? bromocriptine certain medicines for blood pressure, heart disease, irregular heartbeat cyclosporine diuretics medicines for diabetes, including insulin quinidine This list may not describe all possible interactions. Give your health care provider a list of all the medicines, herbs, non-prescription drugs, or dietary supplements you use. Also tell them if you smoke, drink alcohol, or use illegal drugs. Some items may interact with your medicine. What should I watch for while using this medication? Visit your care team for regular checks  on your progress. Tell your care team if your symptoms do not start to get better or if they get worse. To help reduce irritation at the injection site, use a different site for each injection and make sure the solution is at room temperature before use. This medication may cause decreases in blood sugar. Signs of low blood sugar include chills, cool, pale skin or cold sweats, drowsiness, extreme hunger, fast heartbeat, headache, nausea, nervousness or anxiety, shakiness, trembling, unsteadiness, tiredness, or weakness. Contact your care team right away if you experience any of these symptoms. This medication may increase blood sugar. The risk may be higher in patients who already have diabetes. Ask your care team what you can do to lower your risk of diabetes while taking this medication. You should make sure you get enough vitamin B12 while you are taking this medication. Discuss the foods you eat and the vitamins you take with your care team. What side effects may I notice from receiving this medication? Side effects that you should report to your doctor or health care professional as soon as possible: allergic reactions like skin rash, itching or hives, swelling of the face, lips, or tongue fast, slow, or irregular heartbeat right upper belly pain severe stomach pain  signs and symptoms of high blood sugar such as being more thirsty or hungry or having to urinate more than normal. You may also feel very tired or have blurry vision. signs and symptoms of low blood sugar such as feeling anxious; confusion; dizziness; increased hunger; unusually weak or tired; increased sweating; shakiness; cold, clammy skin; irritable; headache; blurred vision; fast heartbeat; loss of consciousness unusually weak or tired Side effects that usually do not require medical attention (report to your doctor or health care professional if they continue or are bothersome): diarrhea dizziness gas headache nausea,  vomiting pain, redness, or irritation at site where injected upset stomach This list may not describe all possible side effects. Call your doctor for medical advice about side effects. You may report side effects to FDA at 1-800-FDA-1088. Where should I keep my medication? Keep out of the reach of children and pets. Store in the refrigerator. Protect from light. Allow to come to room temperature naturally. Do not use artificial heat. If protected from light, the injection may be stored between 20 and 30 degrees C (70 and 86 degrees F) for 14 days. After the initial use, throw away any unused portion of a multiple dose vial after 14 days. Get rid of any unused portions of the ampules after use. To get rid of medications that are no longer needed or have expired: Take the medication to a medication take-back program. Ask your pharmacy or law enforcement to find a location. If you cannot return the medication, ask your pharmacist or care team how to get rid of the medication safely. NOTE: This sheet is a summary. It may not cover all possible information. If you have questions about this medicine, talk to your doctor, pharmacist, or health care provider.  2023 Elsevier/Gold Standard (2021-02-05 00:00:00)

## 2021-08-02 NOTE — Progress Notes (Signed)
F/U appointment for 6/6 did not get scheduled. Sent high priority scheduling message for OV with next Sando injection due week of 7/3.

## 2021-08-03 ENCOUNTER — Inpatient Hospital Stay: Payer: Medicare Other

## 2021-08-03 ENCOUNTER — Inpatient Hospital Stay: Payer: Medicare Other | Admitting: Oncology

## 2021-08-03 ENCOUNTER — Telehealth: Payer: Self-pay

## 2021-08-03 NOTE — Telephone Encounter (Signed)
Pt verbalized understanding. Appointments scheduled for next visit.

## 2021-08-03 NOTE — Telephone Encounter (Signed)
-----   Message from Ladell Pier, MD sent at 08/02/2021  8:19 PM EDT ----- Please call patient, platelets remain low-stable, continue everolimus, schedule office visit with cbc, cmp, and sandostatin 4 weeks

## 2021-08-04 LAB — CHROMOGRANIN A: Chromogranin A (ng/mL): 1468 ng/mL — ABNORMAL HIGH (ref 0.0–101.8)

## 2021-08-05 ENCOUNTER — Other Ambulatory Visit: Payer: Self-pay | Admitting: *Deleted

## 2021-08-05 DIAGNOSIS — C7B8 Other secondary neuroendocrine tumors: Secondary | ICD-10-CM

## 2021-08-05 NOTE — Progress Notes (Signed)
Orders placed for July visit

## 2021-08-17 ENCOUNTER — Other Ambulatory Visit (HOSPITAL_COMMUNITY): Payer: Self-pay

## 2021-08-20 ENCOUNTER — Other Ambulatory Visit (HOSPITAL_COMMUNITY): Payer: Self-pay

## 2021-08-30 ENCOUNTER — Other Ambulatory Visit (HOSPITAL_COMMUNITY): Payer: Self-pay

## 2021-09-03 ENCOUNTER — Other Ambulatory Visit (HOSPITAL_COMMUNITY): Payer: Self-pay

## 2021-09-06 ENCOUNTER — Encounter: Payer: Self-pay | Admitting: Oncology

## 2021-09-06 ENCOUNTER — Telehealth: Payer: Self-pay

## 2021-09-06 ENCOUNTER — Inpatient Hospital Stay: Payer: Medicare Other

## 2021-09-06 ENCOUNTER — Inpatient Hospital Stay (HOSPITAL_BASED_OUTPATIENT_CLINIC_OR_DEPARTMENT_OTHER): Payer: Medicare Other | Admitting: Oncology

## 2021-09-06 ENCOUNTER — Inpatient Hospital Stay: Payer: Medicare Other | Attending: Oncology

## 2021-09-06 VITALS — BP 136/76 | HR 74 | Temp 98.1°F | Resp 18 | Ht 73.0 in | Wt 180.4 lb

## 2021-09-06 DIAGNOSIS — N529 Male erectile dysfunction, unspecified: Secondary | ICD-10-CM | POA: Diagnosis not present

## 2021-09-06 DIAGNOSIS — C7B02 Secondary carcinoid tumors of liver: Secondary | ICD-10-CM | POA: Diagnosis present

## 2021-09-06 DIAGNOSIS — C7B03 Secondary carcinoid tumors of bone: Secondary | ICD-10-CM | POA: Diagnosis present

## 2021-09-06 DIAGNOSIS — E119 Type 2 diabetes mellitus without complications: Secondary | ICD-10-CM | POA: Diagnosis not present

## 2021-09-06 DIAGNOSIS — G4730 Sleep apnea, unspecified: Secondary | ICD-10-CM | POA: Insufficient documentation

## 2021-09-06 DIAGNOSIS — I10 Essential (primary) hypertension: Secondary | ICD-10-CM | POA: Insufficient documentation

## 2021-09-06 DIAGNOSIS — C7A090 Malignant carcinoid tumor of the bronchus and lung: Secondary | ICD-10-CM | POA: Diagnosis not present

## 2021-09-06 DIAGNOSIS — R197 Diarrhea, unspecified: Secondary | ICD-10-CM | POA: Insufficient documentation

## 2021-09-06 DIAGNOSIS — C7B8 Other secondary neuroendocrine tumors: Secondary | ICD-10-CM | POA: Diagnosis not present

## 2021-09-06 DIAGNOSIS — D696 Thrombocytopenia, unspecified: Secondary | ICD-10-CM | POA: Insufficient documentation

## 2021-09-06 LAB — CBC WITH DIFFERENTIAL (CANCER CENTER ONLY)
Abs Immature Granulocytes: 0.02 10*3/uL (ref 0.00–0.07)
Basophils Absolute: 0 10*3/uL (ref 0.0–0.1)
Basophils Relative: 1 %
Eosinophils Absolute: 0.1 10*3/uL (ref 0.0–0.5)
Eosinophils Relative: 1 %
HCT: 34.1 % — ABNORMAL LOW (ref 39.0–52.0)
Hemoglobin: 12.6 g/dL — ABNORMAL LOW (ref 13.0–17.0)
Immature Granulocytes: 1 %
Lymphocytes Relative: 26 %
Lymphs Abs: 1 10*3/uL (ref 0.7–4.0)
MCH: 30.6 pg (ref 26.0–34.0)
MCHC: 37 g/dL — ABNORMAL HIGH (ref 30.0–36.0)
MCV: 82.8 fL (ref 80.0–100.0)
Monocytes Absolute: 0.2 10*3/uL (ref 0.1–1.0)
Monocytes Relative: 6 %
Neutro Abs: 2.6 10*3/uL (ref 1.7–7.7)
Neutrophils Relative %: 65 %
Platelet Count: 90 10*3/uL — ABNORMAL LOW (ref 150–400)
RBC: 4.12 MIL/uL — ABNORMAL LOW (ref 4.22–5.81)
RDW: 13.2 % (ref 11.5–15.5)
WBC Count: 4 10*3/uL (ref 4.0–10.5)
nRBC: 0 % (ref 0.0–0.2)

## 2021-09-06 LAB — CMP (CANCER CENTER ONLY)
ALT: 37 U/L (ref 0–44)
AST: 25 U/L (ref 15–41)
Albumin: 4.5 g/dL (ref 3.5–5.0)
Alkaline Phosphatase: 107 U/L (ref 38–126)
Anion gap: 10 (ref 5–15)
BUN: 18 mg/dL (ref 8–23)
CO2: 27 mmol/L (ref 22–32)
Calcium: 9.3 mg/dL (ref 8.9–10.3)
Chloride: 101 mmol/L (ref 98–111)
Creatinine: 1.03 mg/dL (ref 0.61–1.24)
GFR, Estimated: >60 mL/min (ref >60)
Glucose, Bld: 421 mg/dL — ABNORMAL HIGH (ref 70–99)
Potassium: 4.1 mmol/L (ref 3.5–5.1)
Sodium: 138 mmol/L (ref 135–145)
Total Bilirubin: 1.4 mg/dL — ABNORMAL HIGH (ref 0.3–1.2)
Total Protein: 7.2 g/dL (ref 6.5–8.1)

## 2021-09-06 MED ORDER — OCTREOTIDE ACETATE 30 MG IM KIT
30.0000 mg | PACK | Freq: Once | INTRAMUSCULAR | Status: AC
  Administered 2021-09-06: 30 mg via INTRAMUSCULAR
  Filled 2021-09-06: qty 1

## 2021-09-06 NOTE — Telephone Encounter (Signed)
Pt verbalized understanding.

## 2021-09-06 NOTE — Telephone Encounter (Signed)
-----   Message from Ladell Pier, MD sent at 09/06/2021  4:39 PM EDT ----- Please call patient, his blood sugar is elevated, he needs to follow-up with his primary provider for diabetes management

## 2021-09-06 NOTE — Progress Notes (Signed)
[facility] OFFICE PROGRESS NOTE   Diagnosis: Pancreas neuroendocrine tumor  INTERVAL HISTORY:   [name] returns as scheduled.  He continues monthly Sandostatin and daily everolimus.  He has intermittent mouth soreness.  He bruises easily.  He has a "tick bite "at the left upper back.  His appetite is poor, but he is eating and he feels his weight is stable.  Occasional diarrhea.  Objective:  Vital signs in last 24 hours:  Blood pressure 136/76, pulse 74, temperature 98.1 F (36.7 C), temperature source Oral, resp. rate 18, height 6' 1"  (1.854 m), weight 180 lb 6.4 oz (81.8 kg), SpO2 98 %.    HEENT: No thrush or ulcers Lymphatics: No cervical, supraclavicular, or inguinal nodes.  Prominent bilateral axillary fat pads versus soft mobile bilateral 1-2 cm nodes Resp: Lungs clear bilaterally Cardio: Regular rate and rhythm GI: No hepatosplenomegaly Vascular: No leg edema  Skin: Small ecchymoses at the forearms, 2 cm area of faint erythema and induration of the left upper back   Lab Results:  Lab Results  Component Value Date   WBC 4.0 09/06/2021   HGB 12.6 (L) 09/06/2021   HCT 34.1 (L) 09/06/2021   MCV 82.8 09/06/2021   PLT 90 (L) 09/06/2021   NEUTROABS 2.6 09/06/2021    CMP  Lab Results  Component Value Date   NA 138 09/06/2021   K 4.1 09/06/2021   CL 101 09/06/2021   CO2 27 09/06/2021   GLUCOSE 421 (H) 09/06/2021   BUN 18 09/06/2021   CREATININE 1.03 09/06/2021   CALCIUM 9.3 09/06/2021   PROT 7.2 09/06/2021   ALBUMIN 4.5 09/06/2021   AST 25 09/06/2021   ALT 37 09/06/2021   ALKPHOS 107 09/06/2021   BILITOT 1.4 (H) 09/06/2021   GFRNONAA >60 09/06/2021    No results found for: "CEA1", "CEA", "ENI778", "CA125"  No results found for: "INR", "LABPROT"  Imaging:  No results found.  Medications: I have reviewed the patient's current medications.   Assessment/Plan: Metastatic carcinoid tumor Left lower lobectomy February 2004 at [facility], 2.2  cm typical carcinoid tumor Laparoscopic cholecystectomy November 2014, imaging revealed a 4.4 cm segment 8 liver lesion 03/18/2013-central hepatectomy and drainage of intra-abdominal abscess,- low-grade neuroendocrine carcinoma, tumor appeared totally excised,Ki-67 less than 5% CT's 05/09/2014, 08/16/2016, 09/19/2017-no evidence of recurrent disease CTs 914 2020-3 new liver lesions 12/26/2018 PET dotatate scan-at least 20 liver lesions and extensive bone metastases, hypermetabolic pancreas tail lesion 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 01/16/2019-biopsy of the left hepatic lobe lesion-metastatic neuroendocrine carcinoma,Ki-67 10%- interpretation limited due to scant remaining tumor may not be representative of the overall lesion, mismatch repair protein staining intact 02/27/2019-Sandostatin 30 mg every 4 weeks 09/18/2019- [facility] PET-increased activity in the pancreas tail and liver and bone metastases Late August 2021-everolimus, 7.5 mg daily, placed on hold September 2021 due to acute hyperglycemia/new onset diabetes 01/22/2020-everolimus resumed [facility] 09/29/2020-innumerable liver metastases, unchanged, stable tracer uptake in distal pancreas lesion, innumerable bone lesions, significant decrease in tracer uptake in left humeral lesion, mild decrease in FDG uptake in other bone lesions, no new site of metastatic disease Diabetes ERCP removal of bile duct stones March 2015, March 2018 Sleep apnea Hypertension COVID-19 infection September 2021 Cholecystectomy November 2423 Umbilical hernia repair 5/36/1443 Erectile dysfunction Chronic thrombocytopenia     Disposition: [name] appears stable.  He continues Sandostatin and everolimus.  The platelet count is higher today.  The chromogranin a level was lower 08/02/2021.  He will return for an office  visit and restaging dotatate scan  The blood sugar is elevated today.  I encouraged him to follow-up with his primary provider for  diabetes management.  Michael Coder, MD  09/06/2021  3:54 PM

## 2021-09-06 NOTE — Patient Instructions (Signed)
Octreotide injection solution ?What is this medication? ?OCTREOTIDE (ok TREE oh tide) is used to reduce blood levels of growth hormone in patients with a condition called acromegaly. This medicine also reduces flushing and watery diarrhea caused by certain types of cancer. ?This medicine may be used for other purposes; ask your health care provider or pharmacist if you have questions. ?COMMON BRAND NAME(S): Bynfezia, Sandostatin ?What should I tell my care team before I take this medication? ?They need to know if you have any of these conditions: ?diabetes ?gallbladder disease ?kidney disease ?liver disease ?thyroid disease ?an unusual or allergic reaction to octreotide, other medicines, foods, dyes, or preservatives ?pregnant or trying to get pregnant ?breast-feeding ?How should I use this medication? ?This medication is injected under the skin or into a vein. It is usually given by your care team in a hospital or clinic setting. ?If you get this medication at home, you will be taught how to prepare and give it. Use exactly as directed. Take it as directed on the prescription label at the same time every day. Keep taking it unless your care team tells you to stop. ?Allow the injection solution to come to room temperature before use. Do not warm it artificially. ?It is important that you put your used needles and syringes in a special sharps container. Do not put them in a trash can. If you do not have a sharps container, call your pharmacist or care team to get one. ?Talk to your care team about the use of this medication in children. Special care may be needed. ?Overdosage: If you think you have taken too much of this medicine contact a poison control center or emergency room at once. ?NOTE: This medicine is only for you. Do not share this medicine with others. ?What if I miss a dose? ?If you miss a dose, take it as soon as you can. If it is almost time for your next dose, take only that dose. Do not take double  or extra doses. ?What may interact with this medication? ?bromocriptine ?certain medicines for blood pressure, heart disease, irregular heartbeat ?cyclosporine ?diuretics ?medicines for diabetes, including insulin ?quinidine ?This list may not describe all possible interactions. Give your health care provider a list of all the medicines, herbs, non-prescription drugs, or dietary supplements you use. Also tell them if you smoke, drink alcohol, or use illegal drugs. Some items may interact with your medicine. ?What should I watch for while using this medication? ?Visit your care team for regular checks on your progress. Tell your care team if your symptoms do not start to get better or if they get worse. ?To help reduce irritation at the injection site, use a different site for each injection and make sure the solution is at room temperature before use. ?This medication may cause decreases in blood sugar. Signs of low blood sugar include chills, cool, pale skin or cold sweats, drowsiness, extreme hunger, fast heartbeat, headache, nausea, nervousness or anxiety, shakiness, trembling, unsteadiness, tiredness, or weakness. Contact your care team right away if you experience any of these symptoms. ?This medication may increase blood sugar. The risk may be higher in patients who already have diabetes. Ask your care team what you can do to lower your risk of diabetes while taking this medication. ?You should make sure you get enough vitamin B12 while you are taking this medication. Discuss the foods you eat and the vitamins you take with your care team. ?What side effects may I notice from receiving   this medication? ?Side effects that you should report to your doctor or health care professional as soon as possible: ?allergic reactions like skin rash, itching or hives, swelling of the face, lips, or tongue ?fast, slow, or irregular heartbeat ?right upper belly pain ?severe stomach pain ?signs and symptoms of high blood sugar  such as being more thirsty or hungry or having to urinate more than normal. You may also feel very tired or have blurry vision. ?signs and symptoms of low blood sugar such as feeling anxious; confusion; dizziness; increased hunger; unusually weak or tired; increased sweating; shakiness; cold, clammy skin; irritable; headache; blurred vision; fast heartbeat; loss of consciousness ?unusually weak or tired ?Side effects that usually do not require medical attention (report to your doctor or health care professional if they continue or are bothersome): ?diarrhea ?dizziness ?gas ?headache ?nausea, vomiting ?pain, redness, or irritation at site where injected ?upset stomach ?This list may not describe all possible side effects. Call your doctor for medical advice about side effects. You may report side effects to FDA at 1-800-FDA-1088. ?Where should I keep my medication? ?Keep out of the reach of children and pets. ?Store in the refrigerator. Protect from light. Allow to come to room temperature naturally. Do not use artificial heat. If protected from light, the injection may be stored between 20 and 30 degrees C (70 and 86 degrees F) for 14 days. After the initial use, throw away any unused portion of a multiple dose vial after 14 days. Get rid of any unused portions of the ampules after use. ?To get rid of medications that are no longer needed or have expired: ?Take the medication to a medication take-back program. Ask your pharmacy or law enforcement to find a location. ?If you cannot return the medication, ask your pharmacist or care team how to get rid of the medication safely. ?NOTE: This sheet is a summary. It may not cover all possible information. If you have questions about this medicine, talk to your doctor, pharmacist, or health care provider. ?? 2023 Elsevier/Gold Standard (2021-02-05 00:00:00) ? ?

## 2021-09-22 ENCOUNTER — Other Ambulatory Visit (HOSPITAL_COMMUNITY): Payer: Self-pay

## 2021-10-01 ENCOUNTER — Other Ambulatory Visit: Payer: Self-pay | Admitting: Oncology

## 2021-10-01 ENCOUNTER — Other Ambulatory Visit (HOSPITAL_COMMUNITY): Payer: Self-pay

## 2021-10-01 DIAGNOSIS — C7A090 Malignant carcinoid tumor of the bronchus and lung: Secondary | ICD-10-CM

## 2021-10-01 MED ORDER — EVEROLIMUS 7.5 MG PO TABS
ORAL_TABLET | Freq: Every day | ORAL | 1 refills | Status: DC
  Filled 2021-10-01: qty 30, 30d supply, fill #0
  Filled 2021-10-21: qty 30, 30d supply, fill #1

## 2021-10-04 ENCOUNTER — Other Ambulatory Visit (HOSPITAL_COMMUNITY): Payer: Self-pay

## 2021-10-04 ENCOUNTER — Other Ambulatory Visit: Payer: Self-pay | Admitting: *Deleted

## 2021-10-04 ENCOUNTER — Telehealth: Payer: Self-pay | Admitting: *Deleted

## 2021-10-04 DIAGNOSIS — C7B8 Other secondary neuroendocrine tumors: Secondary | ICD-10-CM

## 2021-10-04 NOTE — Progress Notes (Signed)
Re-entered [facility] order per Dr. Benay Spice request.

## 2021-10-04 NOTE — Telephone Encounter (Signed)
Called patient with [facility] appointment and he reports he does not wish to have the test. Says "Dr. Benay Spice said I didn't have to if I didn't want it". MD made aware and appointment canceled with [facility].

## 2021-10-13 ENCOUNTER — Inpatient Hospital Stay: Payer: Medicare Other | Attending: Oncology

## 2021-10-13 VITALS — BP 134/75 | HR 70 | Temp 98.4°F | Resp 18

## 2021-10-13 DIAGNOSIS — C7B03 Secondary carcinoid tumors of bone: Secondary | ICD-10-CM | POA: Insufficient documentation

## 2021-10-13 DIAGNOSIS — C7A090 Malignant carcinoid tumor of the bronchus and lung: Secondary | ICD-10-CM | POA: Diagnosis present

## 2021-10-13 DIAGNOSIS — C7B02 Secondary carcinoid tumors of liver: Secondary | ICD-10-CM | POA: Insufficient documentation

## 2021-10-13 MED ORDER — OCTREOTIDE ACETATE 30 MG IM KIT
30.0000 mg | PACK | Freq: Once | INTRAMUSCULAR | Status: AC
  Administered 2021-10-13: 30 mg via INTRAMUSCULAR

## 2021-10-13 NOTE — Patient Instructions (Signed)
Octreotide Injection Solution What is this medication? OCTREOTIDE (ok TREE oh tide) treats high levels of growth hormone (acromegaly). It works by reducing the amount of growth hormone your body makes. This reduces symptoms and the risk of health problems caused by too much growth hormone, such as diabetes and heart disease. It may also be used to treat diarrhea caused by neuroendocrine tumors. It works by slowing down the release of serotonin from the tumor cells. This reduces the number of bowel movements you have. This medicine may be used for other purposes; ask your health care provider or pharmacist if you have questions. COMMON BRAND NAME(S): Bynfezia, Sandostatin What should I tell my care team before I take this medication? They need to know if you have any of these conditions: Diabetes Gallbladder disease Kidney disease Liver disease Thyroid disease An unusual or allergic reaction to octreotide, other medications, foods, dyes, or preservatives Pregnant or trying to get pregnant Breast-feeding How should I use this medication? This medication is injected under the skin or into a vein. It is usually given by your care team in a hospital or clinic setting. If you get this medication at home, you will be taught how to prepare and give it. Use exactly as directed. Take it as directed on the prescription label at the same time every day. Keep taking it unless your care team tells you to stop. Allow the injection solution to come to room temperature before use. Do not warm it artificially. It is important that you put your used needles and syringes in a special sharps container. Do not put them in a trash can. If you do not have a sharps container, call your pharmacist or care team to get one. Talk to your care team about the use of this medication in children. Special care may be needed. Overdosage: If you think you have taken too much of this medicine contact a poison control center or  emergency room at once. NOTE: This medicine is only for you. Do not share this medicine with others. What if I miss a dose? If you miss a dose, take it as soon as you can. If it is almost time for your next dose, take only that dose. Do not take double or extra doses. What may interact with this medication? Bromocriptine Certain medications for blood pressure, heart disease, irregular heartbeat Cyclosporine Diuretics Medications for diabetes, including insulin Quinidine This list may not describe all possible interactions. Give your health care provider a list of all the medicines, herbs, non-prescription drugs, or dietary supplements you use. Also tell them if you smoke, drink alcohol, or use illegal drugs. Some items may interact with your medicine. What should I watch for while using this medication? Visit your care team for regular checks on your progress. Tell your care team if your symptoms do not start to get better or if they get worse. To help reduce irritation at the injection site, use a different site for each injection and make sure the solution is at room temperature before use. This medication may cause decreases in blood sugar. Signs of low blood sugar include chills, cool, pale skin or cold sweats, drowsiness, extreme hunger, fast heartbeat, headache, nausea, nervousness or anxiety, shakiness, trembling, unsteadiness, tiredness, or weakness. Contact your care team right away if you experience any of these symptoms. This medication may increase blood sugar. The risk may be higher in patients who already have diabetes. Ask your care team what you can do to lower your   risk of diabetes while taking this medication. You should make sure you get enough vitamin B12 while you are taking this medication. Discuss the foods you eat and the vitamins you take with your care team. What side effects may I notice from receiving this medication? Side effects that you should report to your care  team as soon as possible: Allergic reactions--skin rash, itching, hives, swelling of the face, lips, tongue, or throat Gallbladder problems--severe stomach pain, nausea, vomiting, fever Heart rhythm changes--fast or irregular heartbeat, dizziness, feeling faint or lightheaded, chest pain, trouble breathing High blood sugar (hyperglycemia)--increased thirst or amount of urine, unusual weakness or fatigue, blurry vision Low blood sugar (hypoglycemia)--tremors or shaking, anxiety, sweating, cold or clammy skin, confusion, dizziness, rapid heartbeat Low thyroid levels (hypothyroidism)--unusual weakness or fatigue, increased sensitivity to cold, constipation, hair loss, dry skin, weight gain, feelings of depression Low vitamin B12 level--pain, tingling, or numbness in the hands or feet, muscle weakness, dizziness, confusion, trouble concentrating Pancreatitis--severe stomach pain that spreads to your back or gets worse after eating or when touched, fever, nausea, vomiting Side effects that usually do not require medical attention (report to your care team if they continue or are bothersome): Diarrhea Dizziness Gas Headache Pain, redness, or irritation at injection site Stomach pain This list may not describe all possible side effects. Call your doctor for medical advice about side effects. You may report side effects to FDA at 1-800-FDA-1088. Where should I keep my medication? Keep out of the reach of children and pets. Store in the refrigerator. Protect from light. Allow to come to room temperature naturally. Do not use artificial heat. If protected from light, the injection may be stored between 20 and 30 degrees C (70 and 86 degrees F) for 14 days. After the initial use, throw away any unused portion of a multiple dose vial after 14 days. Get rid of any unused portions of the ampules after use. To get rid of medications that are no longer needed or have expired: Take the medication to a medication  take-back program. Ask your pharmacy or law enforcement to find a location. If you cannot return the medication, ask your pharmacist or care team how to get rid of the medication safely. NOTE: This sheet is a summary. It may not cover all possible information. If you have questions about this medicine, talk to your doctor, pharmacist, or health care provider.  2023 Elsevier/Gold Standard (2007-04-07 00:00:00)  

## 2021-10-18 ENCOUNTER — Other Ambulatory Visit (HOSPITAL_COMMUNITY): Payer: Self-pay

## 2021-10-18 ENCOUNTER — Other Ambulatory Visit (HOSPITAL_COMMUNITY): Payer: Medicare Other

## 2021-10-21 ENCOUNTER — Other Ambulatory Visit (HOSPITAL_COMMUNITY): Payer: Self-pay

## 2021-10-26 ENCOUNTER — Other Ambulatory Visit (HOSPITAL_COMMUNITY): Payer: Self-pay

## 2021-10-29 ENCOUNTER — Other Ambulatory Visit (HOSPITAL_COMMUNITY): Payer: Self-pay

## 2021-11-03 ENCOUNTER — Inpatient Hospital Stay: Payer: Medicare Other

## 2021-11-03 ENCOUNTER — Inpatient Hospital Stay (HOSPITAL_BASED_OUTPATIENT_CLINIC_OR_DEPARTMENT_OTHER): Payer: Medicare Other | Admitting: Oncology

## 2021-11-03 ENCOUNTER — Inpatient Hospital Stay: Payer: Medicare Other | Attending: Oncology

## 2021-11-03 VITALS — BP 133/74 | HR 69 | Temp 98.3°F | Resp 18 | Ht 73.0 in | Wt 176.6 lb

## 2021-11-03 DIAGNOSIS — C7B03 Secondary carcinoid tumors of bone: Secondary | ICD-10-CM | POA: Insufficient documentation

## 2021-11-03 DIAGNOSIS — C7A090 Malignant carcinoid tumor of the bronchus and lung: Secondary | ICD-10-CM | POA: Diagnosis present

## 2021-11-03 DIAGNOSIS — Z8616 Personal history of COVID-19: Secondary | ICD-10-CM | POA: Diagnosis not present

## 2021-11-03 DIAGNOSIS — D696 Thrombocytopenia, unspecified: Secondary | ICD-10-CM | POA: Diagnosis not present

## 2021-11-03 DIAGNOSIS — G4730 Sleep apnea, unspecified: Secondary | ICD-10-CM | POA: Diagnosis not present

## 2021-11-03 DIAGNOSIS — E119 Type 2 diabetes mellitus without complications: Secondary | ICD-10-CM | POA: Insufficient documentation

## 2021-11-03 DIAGNOSIS — N529 Male erectile dysfunction, unspecified: Secondary | ICD-10-CM | POA: Diagnosis not present

## 2021-11-03 DIAGNOSIS — C7B8 Other secondary neuroendocrine tumors: Secondary | ICD-10-CM

## 2021-11-03 DIAGNOSIS — I10 Essential (primary) hypertension: Secondary | ICD-10-CM | POA: Insufficient documentation

## 2021-11-03 DIAGNOSIS — C7B02 Secondary carcinoid tumors of liver: Secondary | ICD-10-CM | POA: Insufficient documentation

## 2021-11-03 LAB — CBC WITH DIFFERENTIAL (CANCER CENTER ONLY)
Abs Immature Granulocytes: 0.01 10*3/uL (ref 0.00–0.07)
Basophils Absolute: 0 10*3/uL (ref 0.0–0.1)
Basophils Relative: 0 %
Eosinophils Absolute: 0.1 10*3/uL (ref 0.0–0.5)
Eosinophils Relative: 2 %
HCT: 31.9 % — ABNORMAL LOW (ref 39.0–52.0)
Hemoglobin: 11.7 g/dL — ABNORMAL LOW (ref 13.0–17.0)
Immature Granulocytes: 0 %
Lymphocytes Relative: 25 %
Lymphs Abs: 0.9 10*3/uL (ref 0.7–4.0)
MCH: 30.5 pg (ref 26.0–34.0)
MCHC: 36.7 g/dL — ABNORMAL HIGH (ref 30.0–36.0)
MCV: 83.1 fL (ref 80.0–100.0)
Monocytes Absolute: 0.2 10*3/uL (ref 0.1–1.0)
Monocytes Relative: 6 %
Neutro Abs: 2.4 10*3/uL (ref 1.7–7.7)
Neutrophils Relative %: 67 %
Platelet Count: 78 10*3/uL — ABNORMAL LOW (ref 150–400)
RBC: 3.84 MIL/uL — ABNORMAL LOW (ref 4.22–5.81)
RDW: 13.3 % (ref 11.5–15.5)
WBC Count: 3.6 10*3/uL — ABNORMAL LOW (ref 4.0–10.5)
nRBC: 0 % (ref 0.0–0.2)

## 2021-11-03 LAB — CMP (CANCER CENTER ONLY)
ALT: 31 U/L (ref 0–44)
AST: 26 U/L (ref 15–41)
Albumin: 4.4 g/dL (ref 3.5–5.0)
Alkaline Phosphatase: 103 U/L (ref 38–126)
Anion gap: 8 (ref 5–15)
BUN: 17 mg/dL (ref 8–23)
CO2: 27 mmol/L (ref 22–32)
Calcium: 9.2 mg/dL (ref 8.9–10.3)
Chloride: 102 mmol/L (ref 98–111)
Creatinine: 1.19 mg/dL (ref 0.61–1.24)
GFR, Estimated: >60 mL/min (ref >60)
Glucose, Bld: 367 mg/dL — ABNORMAL HIGH (ref 70–99)
Potassium: 3.8 mmol/L (ref 3.5–5.1)
Sodium: 137 mmol/L (ref 135–145)
Total Bilirubin: 1.4 mg/dL — ABNORMAL HIGH (ref 0.3–1.2)
Total Protein: 7.1 g/dL (ref 6.5–8.1)

## 2021-11-03 MED ORDER — OCTREOTIDE ACETATE 30 MG IM KIT
30.0000 mg | PACK | Freq: Once | INTRAMUSCULAR | Status: AC
  Administered 2021-11-03: 30 mg via INTRAMUSCULAR
  Filled 2021-11-03: qty 1

## 2021-11-03 NOTE — Progress Notes (Signed)
Michael Good OFFICE PROGRESS NOTE   Diagnosis: Carcinoid tumor  INTERVAL HISTORY:   Michael Good returns as scheduled.  He continues monthly Sandostatin.  He takes everolimus daily.  He complains of easy bruising.  No other bleeding.  No pain.  His appetite remains poor.  He has soft stool, but no diarrhea.  No fever or flushing.  He is working.  He has persistent erectile dysfunction.  Medical therapy has not helped.  Objective:  Vital signs in last 24 hours:  Blood pressure 133/74, pulse 69, temperature 98.3 F (36.8 C), temperature source Oral, resp. rate 18, height _0  (1.854 m), weight 176 lb 9.6 oz (80.1 kg), SpO2 99 %.    HEENT: No thrush or ulcers Resp: Lungs clear bilaterally Cardio: Regular rate and rhythm GI: No hepatosplenomegaly, no mass, nontender Vascular: No leg edema  Skin: Small ecchymoses at the dorsum of the arms and hands bilaterally   Lab Results:  Lab Results  Component Value Date   WBC 3.6 (L) 11/03/2021   HGB 11.7 (L) 11/03/2021   HCT 31.9 (L) 11/03/2021   MCV 83.1 11/03/2021   PLT 78 (L) 11/03/2021   NEUTROABS 2.4 11/03/2021    CMP  Lab Results  Component Value Date   NA 137 11/03/2021   K 3.8 11/03/2021   CL 102 11/03/2021   CO2 27 11/03/2021   GLUCOSE 367 (H) 11/03/2021   BUN 17 11/03/2021   CREATININE 1.19 11/03/2021   CALCIUM 9.2 11/03/2021   PROT 7.1 11/03/2021   ALBUMIN 4.4 11/03/2021   AST 26 11/03/2021   ALT 31 11/03/2021   ALKPHOS 103 11/03/2021   BILITOT 1.4 (H) 11/03/2021   GFRNONAA >60 11/03/2021    Medications: I have reviewed the patient's current medications.   Assessment/Plan: Metastatic carcinoid tumor Left lower lobectomy February 2004 at [facility], 2.2 cm typical carcinoid tumor Laparoscopic cholecystectomy November 2014, imaging revealed a 4.4 cm segment 8 liver lesion 03/18/2013-central hepatectomy and drainage of intra-abdominal abscess,- low-grade neuroendocrine carcinoma, tumor appeared totally  excised,Ki-67 less than 5% CT's 05/09/2014, 08/16/2016, 09/19/2017-no evidence of recurrent disease CTs 914 2020-3 new liver lesions 12/26/2018 PET dotatate scan-at least 20 liver lesions and extensive bone metastases, hypermetabolic pancreas tail lesion 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 01/16/2019-biopsy of the left hepatic lobe lesion-metastatic neuroendocrine carcinoma,Ki-67 10%- interpretation limited due to scant remaining tumor may not be representative of the overall lesion, mismatch repair protein staining intact 02/27/2019-Sandostatin 30 mg every 4 weeks 09/18/2019- Netspot PET-increased activity in the pancreas tail and liver and bone metastases Late August 2021-everolimus, 7.5 mg daily, placed on hold September 2021 due to acute hyperglycemia/new onset diabetes 01/22/2020-everolimus resumed Netspot 09/29/2020-innumerable liver metastases, unchanged, stable tracer uptake in distal pancreas lesion, innumerable bone lesions, significant decrease in tracer uptake in left humeral lesion, mild decrease in FDG uptake in other bone lesions, no new site of metastatic disease Diabetes ERCP removal of bile duct stones March 2015, March 2018 Sleep apnea Hypertension COVID-19 infection September 2021 Cholecystectomy November 2355 Umbilical hernia repair 7/32/2025 Erectile dysfunction Chronic thrombocytopenia      Disposition: Michael Good appears stable.  He continues Sandostatin and everolimus.  He has chronic thrombocytopenia.  The thrombocytopenia places him at increased risk for bruising.  We will follow-up on the chromogranin a level from today.  The chromogranin a level was lower 08/02/2021.  Michael Good will continue monthly Sandostatin and everolimus.  He does not wish to undergo a restaging PET until later this year.  He will return for an office visit in 2 months. We encouraged him to use nutrition supplements and attempt to gain weight. Betsy Coder, MD  11/03/2021  3:50  PM

## 2021-11-03 NOTE — Progress Notes (Signed)
Ok to give sandostatin today per Dr Benay Spice

## 2021-11-03 NOTE — Patient Instructions (Signed)
Octreotide Injection Solution What is this medication? OCTREOTIDE (ok TREE oh tide) treats high levels of growth hormone (acromegaly). It works by reducing the amount of growth hormone your body makes. This reduces symptoms and the risk of health problems caused by too much growth hormone, such as diabetes and heart disease. It may also be used to treat diarrhea caused by neuroendocrine tumors. It works by slowing down the release of serotonin from the tumor cells. This reduces the number of bowel movements you have. This medicine may be used for other purposes; ask your health care provider or pharmacist if you have questions. COMMON BRAND NAME(S): Bynfezia, Sandostatin What should I tell my care team before I take this medication? They need to know if you have any of these conditions: Diabetes Gallbladder disease Kidney disease Liver disease Thyroid disease An unusual or allergic reaction to octreotide, other medications, foods, dyes, or preservatives Pregnant or trying to get pregnant Breast-feeding How should I use this medication? This medication is injected under the skin or into a vein. It is usually given by your care team in a hospital or clinic setting. If you get this medication at home, you will be taught how to prepare and give it. Use exactly as directed. Take it as directed on the prescription label at the same time every day. Keep taking it unless your care team tells you to stop. Allow the injection solution to come to room temperature before use. Do not warm it artificially. It is important that you put your used needles and syringes in a special sharps container. Do not put them in a trash can. If you do not have a sharps container, call your pharmacist or care team to get one. Talk to your care team about the use of this medication in children. Special care may be needed. Overdosage: If you think you have taken too much of this medicine contact a poison control center or  emergency room at once. NOTE: This medicine is only for you. Do not share this medicine with others. What if I miss a dose? If you miss a dose, take it as soon as you can. If it is almost time for your next dose, take only that dose. Do not take double or extra doses. What may interact with this medication? Bromocriptine Certain medications for blood pressure, heart disease, irregular heartbeat Cyclosporine Diuretics Medications for diabetes, including insulin Quinidine This list may not describe all possible interactions. Give your health care provider a list of all the medicines, herbs, non-prescription drugs, or dietary supplements you use. Also tell them if you smoke, drink alcohol, or use illegal drugs. Some items may interact with your medicine. What should I watch for while using this medication? Visit your care team for regular checks on your progress. Tell your care team if your symptoms do not start to get better or if they get worse. To help reduce irritation at the injection site, use a different site for each injection and make sure the solution is at room temperature before use. This medication may cause decreases in blood sugar. Signs of low blood sugar include chills, cool, pale skin or cold sweats, drowsiness, extreme hunger, fast heartbeat, headache, nausea, nervousness or anxiety, shakiness, trembling, unsteadiness, tiredness, or weakness. Contact your care team right away if you experience any of these symptoms. This medication may increase blood sugar. The risk may be higher in patients who already have diabetes. Ask your care team what you can do to lower your   risk of diabetes while taking this medication. You should make sure you get enough vitamin B12 while you are taking this medication. Discuss the foods you eat and the vitamins you take with your care team. What side effects may I notice from receiving this medication? Side effects that you should report to your care  team as soon as possible: Allergic reactions--skin rash, itching, hives, swelling of the face, lips, tongue, or throat Gallbladder problems--severe stomach pain, nausea, vomiting, fever Heart rhythm changes--fast or irregular heartbeat, dizziness, feeling faint or lightheaded, chest pain, trouble breathing High blood sugar (hyperglycemia)--increased thirst or amount of urine, unusual weakness or fatigue, blurry vision Low blood sugar (hypoglycemia)--tremors or shaking, anxiety, sweating, cold or clammy skin, confusion, dizziness, rapid heartbeat Low thyroid levels (hypothyroidism)--unusual weakness or fatigue, increased sensitivity to cold, constipation, hair loss, dry skin, weight gain, feelings of depression Low vitamin B12 level--pain, tingling, or numbness in the hands or feet, muscle weakness, dizziness, confusion, trouble concentrating Pancreatitis--severe stomach pain that spreads to your back or gets worse after eating or when touched, fever, nausea, vomiting Side effects that usually do not require medical attention (report to your care team if they continue or are bothersome): Diarrhea Dizziness Gas Headache Pain, redness, or irritation at injection site Stomach pain This list may not describe all possible side effects. Call your doctor for medical advice about side effects. You may report side effects to FDA at 1-800-FDA-1088. Where should I keep my medication? Keep out of the reach of children and pets. Store in the refrigerator. Protect from light. Allow to come to room temperature naturally. Do not use artificial heat. If protected from light, the injection may be stored between 20 and 30 degrees C (70 and 86 degrees F) for 14 days. After the initial use, throw away any unused portion of a multiple dose vial after 14 days. Get rid of any unused portions of the ampules after use. To get rid of medications that are no longer needed or have expired: Take the medication to a medication  take-back program. Ask your pharmacy or law enforcement to find a location. If you cannot return the medication, ask your pharmacist or care team how to get rid of the medication safely. NOTE: This sheet is a summary. It may not cover all possible information. If you have questions about this medicine, talk to your doctor, pharmacist, or health care provider.  2023 Elsevier/Gold Standard (2007-04-07 00:00:00)  

## 2021-11-05 LAB — CHROMOGRANIN A: Chromogranin A (ng/mL): 2053 ng/mL — ABNORMAL HIGH (ref 0.0–101.8)

## 2021-11-23 ENCOUNTER — Other Ambulatory Visit (HOSPITAL_COMMUNITY): Payer: Self-pay

## 2021-11-25 ENCOUNTER — Other Ambulatory Visit (HOSPITAL_COMMUNITY): Payer: Self-pay

## 2021-11-25 ENCOUNTER — Other Ambulatory Visit: Payer: Self-pay | Admitting: Oncology

## 2021-11-25 DIAGNOSIS — C7A090 Malignant carcinoid tumor of the bronchus and lung: Secondary | ICD-10-CM

## 2021-11-25 MED ORDER — EVEROLIMUS 7.5 MG PO TABS
ORAL_TABLET | Freq: Every day | ORAL | 1 refills | Status: DC
  Filled 2021-11-25: qty 30, 30d supply, fill #0
  Filled 2021-12-27: qty 30, 30d supply, fill #1

## 2021-11-29 ENCOUNTER — Other Ambulatory Visit (HOSPITAL_COMMUNITY): Payer: Self-pay

## 2021-11-29 ENCOUNTER — Other Ambulatory Visit: Payer: Self-pay | Admitting: *Deleted

## 2021-11-29 DIAGNOSIS — C7B8 Other secondary neuroendocrine tumors: Secondary | ICD-10-CM

## 2021-11-29 DIAGNOSIS — C7A090 Malignant carcinoid tumor of the bronchus and lung: Secondary | ICD-10-CM

## 2021-12-01 ENCOUNTER — Inpatient Hospital Stay: Payer: Medicare Other

## 2021-12-01 ENCOUNTER — Inpatient Hospital Stay: Payer: Medicare Other | Attending: Oncology

## 2021-12-01 VITALS — BP 134/70 | HR 69 | Temp 98.1°F | Resp 19 | Ht 73.0 in | Wt 178.0 lb

## 2021-12-01 DIAGNOSIS — C7A090 Malignant carcinoid tumor of the bronchus and lung: Secondary | ICD-10-CM

## 2021-12-01 DIAGNOSIS — C7B03 Secondary carcinoid tumors of bone: Secondary | ICD-10-CM | POA: Insufficient documentation

## 2021-12-01 DIAGNOSIS — C7B02 Secondary carcinoid tumors of liver: Secondary | ICD-10-CM | POA: Diagnosis present

## 2021-12-01 DIAGNOSIS — C7B8 Other secondary neuroendocrine tumors: Secondary | ICD-10-CM

## 2021-12-01 LAB — CMP (CANCER CENTER ONLY)
ALT: 24 U/L (ref 0–44)
AST: 24 U/L (ref 15–41)
Albumin: 4.4 g/dL (ref 3.5–5.0)
Alkaline Phosphatase: 74 U/L (ref 38–126)
Anion gap: 6 (ref 5–15)
BUN: 21 mg/dL (ref 8–23)
CO2: 27 mmol/L (ref 22–32)
Calcium: 9.2 mg/dL (ref 8.9–10.3)
Chloride: 101 mmol/L (ref 98–111)
Creatinine: 1.18 mg/dL (ref 0.61–1.24)
GFR, Estimated: >60 mL/min (ref >60)
Glucose, Bld: 387 mg/dL — ABNORMAL HIGH (ref 70–99)
Potassium: 4.3 mmol/L (ref 3.5–5.1)
Sodium: 134 mmol/L — ABNORMAL LOW (ref 135–145)
Total Bilirubin: 1.1 mg/dL (ref 0.3–1.2)
Total Protein: 6.7 g/dL (ref 6.5–8.1)

## 2021-12-01 LAB — CBC WITH DIFFERENTIAL (CANCER CENTER ONLY)
Abs Immature Granulocytes: 0.01 10*3/uL (ref 0.00–0.07)
Basophils Absolute: 0 10*3/uL (ref 0.0–0.1)
Basophils Relative: 1 %
Eosinophils Absolute: 0 10*3/uL (ref 0.0–0.5)
Eosinophils Relative: 1 %
HCT: 34 % — ABNORMAL LOW (ref 39.0–52.0)
Hemoglobin: 12.7 g/dL — ABNORMAL LOW (ref 13.0–17.0)
Immature Granulocytes: 0 %
Lymphocytes Relative: 25 %
Lymphs Abs: 0.9 10*3/uL (ref 0.7–4.0)
MCH: 31.1 pg (ref 26.0–34.0)
MCHC: 37.4 g/dL — ABNORMAL HIGH (ref 30.0–36.0)
MCV: 83.3 fL (ref 80.0–100.0)
Monocytes Absolute: 0.3 10*3/uL (ref 0.1–1.0)
Monocytes Relative: 8 %
Neutro Abs: 2.5 10*3/uL (ref 1.7–7.7)
Neutrophils Relative %: 65 %
Platelet Count: 83 10*3/uL — ABNORMAL LOW (ref 150–400)
RBC: 4.08 MIL/uL — ABNORMAL LOW (ref 4.22–5.81)
RDW: 13.3 % (ref 11.5–15.5)
WBC Count: 3.8 10*3/uL — ABNORMAL LOW (ref 4.0–10.5)
nRBC: 0 % (ref 0.0–0.2)

## 2021-12-01 MED ORDER — OCTREOTIDE ACETATE 30 MG IM KIT
30.0000 mg | PACK | Freq: Once | INTRAMUSCULAR | Status: AC
  Administered 2021-12-01: 30 mg via INTRAMUSCULAR
  Filled 2021-12-01: qty 1

## 2021-12-01 NOTE — Patient Instructions (Signed)
Octreotide Injection Solution What is this medication? OCTREOTIDE (ok TREE oh tide) treats high levels of growth hormone (acromegaly). It works by reducing the amount of growth hormone your body makes. This reduces symptoms and the risk of health problems caused by too much growth hormone, such as diabetes and heart disease. It may also be used to treat diarrhea caused by neuroendocrine tumors. It works by slowing down the release of serotonin from the tumor cells. This reduces the number of bowel movements you have. This medicine may be used for other purposes; ask your health care provider or pharmacist if you have questions. COMMON BRAND NAME(S): Bynfezia, Sandostatin What should I tell my care team before I take this medication? They need to know if you have any of these conditions: Diabetes Gallbladder disease Kidney disease Liver disease Thyroid disease An unusual or allergic reaction to octreotide, other medications, foods, dyes, or preservatives Pregnant or trying to get pregnant Breast-feeding How should I use this medication? This medication is injected under the skin or into a vein. It is usually given by your care team in a hospital or clinic setting. If you get this medication at home, you will be taught how to prepare and give it. Use exactly as directed. Take it as directed on the prescription label at the same time every day. Keep taking it unless your care team tells you to stop. Allow the injection solution to come to room temperature before use. Do not warm it artificially. It is important that you put your used needles and syringes in a special sharps container. Do not put them in a trash can. If you do not have a sharps container, call your pharmacist or care team to get one. Talk to your care team about the use of this medication in children. Special care may be needed. Overdosage: If you think you have taken too much of this medicine contact a poison control center or  emergency room at once. NOTE: This medicine is only for you. Do not share this medicine with others. What if I miss a dose? If you miss a dose, take it as soon as you can. If it is almost time for your next dose, take only that dose. Do not take double or extra doses. What may interact with this medication? Bromocriptine Certain medications for blood pressure, heart disease, irregular heartbeat Cyclosporine Diuretics Medications for diabetes, including insulin Quinidine This list may not describe all possible interactions. Give your health care provider a list of all the medicines, herbs, non-prescription drugs, or dietary supplements you use. Also tell them if you smoke, drink alcohol, or use illegal drugs. Some items may interact with your medicine. What should I watch for while using this medication? Visit your care team for regular checks on your progress. Tell your care team if your symptoms do not start to get better or if they get worse. To help reduce irritation at the injection site, use a different site for each injection and make sure the solution is at room temperature before use. This medication may cause decreases in blood sugar. Signs of low blood sugar include chills, cool, pale skin or cold sweats, drowsiness, extreme hunger, fast heartbeat, headache, nausea, nervousness or anxiety, shakiness, trembling, unsteadiness, tiredness, or weakness. Contact your care team right away if you experience any of these symptoms. This medication may increase blood sugar. The risk may be higher in patients who already have diabetes. Ask your care team what you can do to lower your   risk of diabetes while taking this medication. You should make sure you get enough vitamin B12 while you are taking this medication. Discuss the foods you eat and the vitamins you take with your care team. What side effects may I notice from receiving this medication? Side effects that you should report to your care  team as soon as possible: Allergic reactions--skin rash, itching, hives, swelling of the face, lips, tongue, or throat Gallbladder problems--severe stomach pain, nausea, vomiting, fever Heart rhythm changes--fast or irregular heartbeat, dizziness, feeling faint or lightheaded, chest pain, trouble breathing High blood sugar (hyperglycemia)--increased thirst or amount of urine, unusual weakness or fatigue, blurry vision Low blood sugar (hypoglycemia)--tremors or shaking, anxiety, sweating, cold or clammy skin, confusion, dizziness, rapid heartbeat Low thyroid levels (hypothyroidism)--unusual weakness or fatigue, increased sensitivity to cold, constipation, hair loss, dry skin, weight gain, feelings of depression Low vitamin B12 level--pain, tingling, or numbness in the hands or feet, muscle weakness, dizziness, confusion, trouble concentrating Pancreatitis--severe stomach pain that spreads to your back or gets worse after eating or when touched, fever, nausea, vomiting Side effects that usually do not require medical attention (report to your care team if they continue or are bothersome): Diarrhea Dizziness Gas Headache Pain, redness, or irritation at injection site Stomach pain This list may not describe all possible side effects. Call your doctor for medical advice about side effects. You may report side effects to FDA at 1-800-FDA-1088. Where should I keep my medication? Keep out of the reach of children and pets. Store in the refrigerator. Protect from light. Allow to come to room temperature naturally. Do not use artificial heat. If protected from light, the injection may be stored between 20 and 30 degrees C (70 and 86 degrees F) for 14 days. After the initial use, throw away any unused portion of a multiple dose vial after 14 days. Get rid of any unused portions of the ampules after use. To get rid of medications that are no longer needed or have expired: Take the medication to a medication  take-back program. Ask your pharmacy or law enforcement to find a location. If you cannot return the medication, ask your pharmacist or care team how to get rid of the medication safely. NOTE: This sheet is a summary. It may not cover all possible information. If you have questions about this medicine, talk to your doctor, pharmacist, or health care provider.  2023 Elsevier/Gold Standard (2007-04-07 00:00:00)  

## 2021-12-21 ENCOUNTER — Other Ambulatory Visit (HOSPITAL_COMMUNITY): Payer: Self-pay

## 2021-12-23 ENCOUNTER — Other Ambulatory Visit (HOSPITAL_COMMUNITY): Payer: Self-pay

## 2021-12-27 ENCOUNTER — Other Ambulatory Visit: Payer: Self-pay | Admitting: *Deleted

## 2021-12-27 ENCOUNTER — Other Ambulatory Visit (HOSPITAL_COMMUNITY): Payer: Self-pay

## 2021-12-27 DIAGNOSIS — C7A090 Malignant carcinoid tumor of the bronchus and lung: Secondary | ICD-10-CM

## 2021-12-29 ENCOUNTER — Inpatient Hospital Stay: Payer: Medicare Other

## 2021-12-29 ENCOUNTER — Telehealth: Payer: Self-pay

## 2021-12-29 ENCOUNTER — Encounter: Payer: Self-pay | Admitting: Nurse Practitioner

## 2021-12-29 ENCOUNTER — Inpatient Hospital Stay: Payer: Medicare Other | Attending: Oncology | Admitting: Nurse Practitioner

## 2021-12-29 VITALS — BP 136/76 | HR 71 | Temp 97.9°F | Resp 18 | Ht 73.0 in | Wt 177.0 lb

## 2021-12-29 DIAGNOSIS — C7B03 Secondary carcinoid tumors of bone: Secondary | ICD-10-CM | POA: Insufficient documentation

## 2021-12-29 DIAGNOSIS — C7B02 Secondary carcinoid tumors of liver: Secondary | ICD-10-CM | POA: Insufficient documentation

## 2021-12-29 DIAGNOSIS — C7A090 Malignant carcinoid tumor of the bronchus and lung: Secondary | ICD-10-CM | POA: Diagnosis not present

## 2021-12-29 DIAGNOSIS — G4730 Sleep apnea, unspecified: Secondary | ICD-10-CM | POA: Insufficient documentation

## 2021-12-29 DIAGNOSIS — D696 Thrombocytopenia, unspecified: Secondary | ICD-10-CM | POA: Diagnosis not present

## 2021-12-29 DIAGNOSIS — Z23 Encounter for immunization: Secondary | ICD-10-CM

## 2021-12-29 DIAGNOSIS — E1165 Type 2 diabetes mellitus with hyperglycemia: Secondary | ICD-10-CM | POA: Diagnosis not present

## 2021-12-29 DIAGNOSIS — N529 Male erectile dysfunction, unspecified: Secondary | ICD-10-CM | POA: Diagnosis not present

## 2021-12-29 DIAGNOSIS — I10 Essential (primary) hypertension: Secondary | ICD-10-CM | POA: Diagnosis not present

## 2021-12-29 LAB — CBC WITH DIFFERENTIAL (CANCER CENTER ONLY)
Abs Immature Granulocytes: 0.01 K/uL (ref 0.00–0.07)
Basophils Absolute: 0 K/uL (ref 0.0–0.1)
Basophils Relative: 0 %
Eosinophils Absolute: 0 K/uL (ref 0.0–0.5)
Eosinophils Relative: 1 %
HCT: 35.3 % — ABNORMAL LOW (ref 39.0–52.0)
Hemoglobin: 13.2 g/dL (ref 13.0–17.0)
Immature Granulocytes: 0 %
Lymphocytes Relative: 23 %
Lymphs Abs: 0.8 K/uL (ref 0.7–4.0)
MCH: 31.1 pg (ref 26.0–34.0)
MCHC: 37.4 g/dL — ABNORMAL HIGH (ref 30.0–36.0)
MCV: 83.3 fL (ref 80.0–100.0)
Monocytes Absolute: 0.2 K/uL (ref 0.1–1.0)
Monocytes Relative: 6 %
Neutro Abs: 2.2 K/uL (ref 1.7–7.7)
Neutrophils Relative %: 70 %
Platelet Count: 99 K/uL — ABNORMAL LOW (ref 150–400)
RBC: 4.24 MIL/uL (ref 4.22–5.81)
RDW: 12.8 % (ref 11.5–15.5)
WBC Count: 3.2 K/uL — ABNORMAL LOW (ref 4.0–10.5)
nRBC: 0 % (ref 0.0–0.2)

## 2021-12-29 LAB — CMP (CANCER CENTER ONLY)
ALT: 44 U/L (ref 0–44)
AST: 34 U/L (ref 15–41)
Albumin: 4.4 g/dL (ref 3.5–5.0)
Alkaline Phosphatase: 100 U/L (ref 38–126)
Anion gap: 6 (ref 5–15)
BUN: 17 mg/dL (ref 8–23)
CO2: 27 mmol/L (ref 22–32)
Calcium: 8.9 mg/dL (ref 8.9–10.3)
Chloride: 102 mmol/L (ref 98–111)
Creatinine: 1.19 mg/dL (ref 0.61–1.24)
GFR, Estimated: >60 mL/min (ref >60)
Glucose, Bld: 439 mg/dL — ABNORMAL HIGH (ref 70–99)
Potassium: 4.1 mmol/L (ref 3.5–5.1)
Sodium: 135 mmol/L (ref 135–145)
Total Bilirubin: 1.3 mg/dL — ABNORMAL HIGH (ref 0.3–1.2)
Total Protein: 7.2 g/dL (ref 6.5–8.1)

## 2021-12-29 MED ORDER — INFLUENZA VAC A&B SA ADJ QUAD 0.5 ML IM PRSY
0.5000 mL | PREFILLED_SYRINGE | Freq: Once | INTRAMUSCULAR | Status: AC
  Administered 2021-12-29: 0.5 mL via INTRAMUSCULAR
  Filled 2021-12-29: qty 0.5

## 2021-12-29 MED ORDER — OCTREOTIDE ACETATE 30 MG IM KIT
30.0000 mg | PACK | Freq: Once | INTRAMUSCULAR | Status: AC
  Administered 2021-12-29: 30 mg via INTRAMUSCULAR
  Filled 2021-12-29: qty 1

## 2021-12-29 NOTE — Telephone Encounter (Signed)
Patient received a case of ensure.

## 2021-12-29 NOTE — Progress Notes (Signed)
Patient seen by Lisa Thomas NP today  Vitals are within treatment parameters.  Labs reviewed by Lisa Thomas NP and are within treatment parameters.  Per physician team, patient is ready for treatment and there are NO modifications to the treatment plan.     

## 2021-12-29 NOTE — Patient Instructions (Signed)
Octreotide Injection Solution What is this medication? OCTREOTIDE (ok TREE oh tide) treats high levels of growth hormone (acromegaly). It works by reducing the amount of growth hormone your body makes. This reduces symptoms and the risk of health problems caused by too much growth hormone, such as diabetes and heart disease. It may also be used to treat diarrhea caused by neuroendocrine tumors. It works by slowing down the release of serotonin from the tumor cells. This reduces the number of bowel movements you have. This medicine may be used for other purposes; ask your health care provider or pharmacist if you have questions. COMMON BRAND NAME(S): Bynfezia, Sandostatin What should I tell my care team before I take this medication? They need to know if you have any of these conditions: Diabetes Gallbladder disease Kidney disease Liver disease Thyroid disease An unusual or allergic reaction to octreotide, other medications, foods, dyes, or preservatives Pregnant or trying to get pregnant Breast-feeding How should I use this medication? This medication is injected under the skin or into a vein. It is usually given by your care team in a hospital or clinic setting. If you get this medication at home, you will be taught how to prepare and give it. Use exactly as directed. Take it as directed on the prescription label at the same time every day. Keep taking it unless your care team tells you to stop. Allow the injection solution to come to room temperature before use. Do not warm it artificially. It is important that you put your used needles and syringes in a special sharps container. Do not put them in a trash can. If you do not have a sharps container, call your pharmacist or care team to get one. Talk to your care team about the use of this medication in children. Special care may be needed. Overdosage: If you think you have taken too much of this medicine contact a poison control center or  emergency room at once. NOTE: This medicine is only for you. Do not share this medicine with others. What if I miss a dose? If you miss a dose, take it as soon as you can. If it is almost time for your next dose, take only that dose. Do not take double or extra doses. What may interact with this medication? Bromocriptine Certain medications for blood pressure, heart disease, irregular heartbeat Cyclosporine Diuretics Medications for diabetes, including insulin Quinidine This list may not describe all possible interactions. Give your health care provider a list of all the medicines, herbs, non-prescription drugs, or dietary supplements you use. Also tell them if you smoke, drink alcohol, or use illegal drugs. Some items may interact with your medicine. What should I watch for while using this medication? Visit your care team for regular checks on your progress. Tell your care team if your symptoms do not start to get better or if they get worse. To help reduce irritation at the injection site, use a different site for each injection and make sure the solution is at room temperature before use. This medication may cause decreases in blood sugar. Signs of low blood sugar include chills, cool, pale skin or cold sweats, drowsiness, extreme hunger, fast heartbeat, headache, nausea, nervousness or anxiety, shakiness, trembling, unsteadiness, tiredness, or weakness. Contact your care team right away if you experience any of these symptoms. This medication may increase blood sugar. The risk may be higher in patients who already have diabetes. Ask your care team what you can do to lower your   risk of diabetes while taking this medication. You should make sure you get enough vitamin B12 while you are taking this medication. Discuss the foods you eat and the vitamins you take with your care team. What side effects may I notice from receiving this medication? Side effects that you should report to your care  team as soon as possible: Allergic reactions--skin rash, itching, hives, swelling of the face, lips, tongue, or throat Gallbladder problems--severe stomach pain, nausea, vomiting, fever Heart rhythm changes--fast or irregular heartbeat, dizziness, feeling faint or lightheaded, chest pain, trouble breathing High blood sugar (hyperglycemia)--increased thirst or amount of urine, unusual weakness or fatigue, blurry vision Low blood sugar (hypoglycemia)--tremors or shaking, anxiety, sweating, cold or clammy skin, confusion, dizziness, rapid heartbeat Low thyroid levels (hypothyroidism)--unusual weakness or fatigue, increased sensitivity to cold, constipation, hair loss, dry skin, weight gain, feelings of depression Low vitamin B12 level--pain, tingling, or numbness in the hands or feet, muscle weakness, dizziness, confusion, trouble concentrating Pancreatitis--severe stomach pain that spreads to your back or gets worse after eating or when touched, fever, nausea, vomiting Side effects that usually do not require medical attention (report to your care team if they continue or are bothersome): Diarrhea Dizziness Gas Headache Pain, redness, or irritation at injection site Stomach pain This list may not describe all possible side effects. Call your doctor for medical advice about side effects. You may report side effects to FDA at 1-800-FDA-1088. Where should I keep my medication? Keep out of the reach of children and pets. Store in the refrigerator. Protect from light. Allow to come to room temperature naturally. Do not use artificial heat. If protected from light, the injection may be stored between 20 and 30 degrees C (70 and 86 degrees F) for 14 days. After the initial use, throw away any unused portion of a multiple dose vial after 14 days. Get rid of any unused portions of the ampules after use. To get rid of medications that are no longer needed or have expired: Take the medication to a medication  take-back program. Ask your pharmacy or law enforcement to find a location. If you cannot return the medication, ask your pharmacist or care team how to get rid of the medication safely. NOTE: This sheet is a summary. It may not cover all possible information. If you have questions about this medicine, talk to your doctor, pharmacist, or health care provider.  2023 Elsevier/Gold Standard (2021-05-19 00:00:00) Influenza, Adult Influenza, also called "the flu," is a viral infection that mainly affects the respiratory tract. This includes the lungs, nose, and throat. The flu spreads easily from person to person (is contagious). It causes common cold symptoms, along with high fever and body aches. What are the causes? This condition is caused by the influenza virus. You can get the virus by: Breathing in droplets that are in the air from an infected person's cough or sneeze. Touching something that has the virus on it (has been contaminated) and then touching your mouth, nose, or eyes. What increases the risk? The following factors may make you more likely to get the flu: Not washing or sanitizing your hands often. Having close contact with many people during cold and flu season. Touching your mouth, eyes, or nose without first washing or sanitizing your hands. Not getting an annual flu shot. You may have a higher risk for the flu, including serious problems, such as a lung infection (pneumonia), if you: Are older than 65. Are pregnant. Have a weakened disease-fighting system (immune  system). This includes people who have HIV or AIDS, are on chemotherapy, or are taking medicines that reduce (suppress) the immune system. Have a long-term (chronic) illness, such as heart disease, kidney disease, diabetes, or lung disease. Have a liver disorder. Are severely overweight (morbidly obese). Have anemia. Have asthma. What are the signs or symptoms? Symptoms of this condition usually begin suddenly and  last 4-14 days. These may include: Fever and chills. Headaches, body aches, or muscle aches. Sore throat. Cough. Runny or stuffy (congested) nose. Chest discomfort. Poor appetite. Weakness or fatigue. Dizziness. Nausea or vomiting. How is this diagnosed? This condition may be diagnosed based on: Your symptoms and medical history. A physical exam. Swabbing your nose or throat and testing the fluid for the influenza virus. How is this treated? If the flu is diagnosed early, you can be treated with antiviral medicine that is given by mouth (orally) or through an IV. This can help reduce how severe the illness is and how long it lasts. Taking care of yourself at home can help relieve symptoms. Your health care provider may recommend: Taking over-the-counter medicines. Drinking plenty of fluids. In many cases, the flu goes away on its own. If you have severe symptoms or complications, you may be treated in a hospital. Follow these instructions at home: Activity Rest as needed and get plenty of sleep. Stay home from work or school as told by your health care provider. Unless you are visiting your health care provider, avoid leaving home until your fever has been gone for 24 hours without taking medicine. Eating and drinking Take an oral rehydration solution (ORS). This is a drink that is sold at pharmacies and retail stores. Drink enough fluid to keep your urine pale yellow. Drink clear fluids in small amounts as you are able. Clear fluids include water, ice chips, fruit juice mixed with water, and low-calorie sports drinks. Eat bland, easy-to-digest foods in small amounts as you are able. These foods include bananas, applesauce, rice, lean meats, toast, and crackers. Avoid drinking fluids that contain a lot of sugar or caffeine, such as energy drinks, regular sports drinks, and soda. Avoid alcohol. Avoid spicy or fatty foods. General instructions     Take over-the-counter and  prescription medicines only as told by your health care provider. Use a cool mist humidifier to add humidity to the air in your home. This can make it easier to breathe. When using a cool mist humidifier, clean it daily. Empty the water and replace it with clean water. Cover your mouth and nose when you cough or sneeze. Wash your hands with soap and water often and for at least 20 seconds, especially after you cough or sneeze. If soap and water are not available, use alcohol-based hand sanitizer. Keep all follow-up visits. This is important. How is this prevented?  Get an annual flu shot. This is usually available in late summer, fall, or winter. Ask your health care provider when you should get your flu shot. Avoid contact with people who are sick during cold and flu season. This is generally fall and winter. Contact a health care provider if: You develop new symptoms. You have: Chest pain. Diarrhea. A fever. Your cough gets worse. You produce more mucus. You feel nauseous or you vomit. Get help right away if you: Develop shortness of breath or have difficulty breathing. Have skin or nails that turn a bluish color. Have severe pain or stiffness in your neck. Develop a sudden headache or sudden pain in  your face or ear. Cannot eat or drink without vomiting. These symptoms may represent a serious problem that is an emergency. Do not wait to see if the symptoms will go away. Get medical help right away. Call your local emergency services (911 in the [place] Do not drive yourself to the hospital. Summary Influenza, also called "the flu," is a viral infection that primarily affects your respiratory tract. Symptoms of the flu usually begin suddenly and last 4-14 days. Getting an annual flu shot is the best way to prevent getting the flu. Stay home from work or school as told by your health care provider. Unless you are visiting your health care provider, avoid leaving home until your fever  has been gone for 24 hours without taking medicine. Keep all follow-up visits. This is important. This information is not intended to replace advice given to you by your health care provider. Make sure you discuss any questions you have with your health care provider. Document Revised: 10/04/2019 Document Reviewed: 10/04/2019 Elsevier Patient Education  [place].

## 2021-12-29 NOTE — Progress Notes (Signed)
Michael OFFICE PROGRESS NOTE   Diagnosis: Carcinoid tumor  INTERVAL HISTORY:   Michael Good returns as scheduled.  He continues monthly Sandostatin.  He continues everolimus.  He denies nausea/vomiting.  No mouth sores.  He has periodic loose stools.  He notes that his cheeks are intermittently red.  He wonders if this is due to "wind" exposure.  No sweating.  He intermittently notes a "breakout" posterior neck at the hairline.  He denies pain.  He reports eating a significant amount of candy last night with his grandchildren.  Objective:  Vital signs in last 24 hours:  Blood pressure 136/76, pulse 71, temperature 97.9 F (36.6 C), temperature source Oral, resp. rate 18, height _0  (1.854 m), weight 177 lb (80.3 kg), SpO2 100 %.    HEENT: No thrush.  Area of erythema right posterior buccal region. Resp: Lungs clear bilaterally. Cardio: Regular rate and rhythm. GI: Abdomen soft and nontender.  No hepatomegaly. Vascular: No leg edema. Neuro: Alert and oriented. Skin: Mild malar erythema.   Lab Results:  Lab Results  Component Value Date   WBC 3.2 (L) 12/29/2021   HGB 13.2 12/29/2021   HCT 35.3 (L) 12/29/2021   MCV 83.3 12/29/2021   PLT 99 (L) 12/29/2021   NEUTROABS 2.2 12/29/2021    Imaging:  No results found.  Medications: I have reviewed the patient's current medications.  Assessment/Plan: Metastatic carcinoid tumor Left lower lobectomy February 2004 at [facility], 2.2 cm typical carcinoid tumor Laparoscopic cholecystectomy November 2014, imaging revealed a 4.4 cm segment 8 liver lesion 03/18/2013-central hepatectomy and drainage of intra-abdominal abscess,- low-grade neuroendocrine carcinoma, tumor appeared totally excised,Ki-67 less than 5% CT's 05/09/2014, 08/16/2016, 09/19/2017-no evidence of recurrent disease CTs 914 2020-3 new liver lesions 12/26/2018 PET dotatate scan-at least 20 liver lesions and extensive bone metastases, hypermetabolic pancreas  tail lesion 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 01/16/2019-biopsy of the left hepatic lobe lesion-metastatic neuroendocrine carcinoma,Ki-67 10%- interpretation limited due to scant remaining tumor may not be representative of the overall lesion, mismatch repair protein staining intact 02/27/2019-Sandostatin 30 mg every 4 weeks 09/18/2019- Netspot PET-increased activity in the pancreas tail and liver and bone metastases Late August 2021-everolimus, 7.5 mg daily, placed on hold September 2021 due to acute hyperglycemia/new onset diabetes 01/22/2020-everolimus resumed Netspot 09/29/2020-innumerable liver metastases, unchanged, stable tracer uptake in distal pancreas lesion, innumerable bone lesions, significant decrease in tracer uptake in left humeral lesion, mild decrease in FDG uptake in other bone lesions, no new site of metastatic disease Diabetes ERCP removal of bile duct stones March 2015, March 2018 Sleep apnea Hypertension COVID-19 infection September 2021 Cholecystectomy November 1610 Umbilical hernia repair 9/60/4540 Erectile dysfunction Chronic thrombocytopenia  Disposition: Michael Good appears stable.  He will continue everolimus and monthly Sandostatin.  He is due for a Sandostatin injection today.  He agrees to a restaging Netspot at the end of the year.  CBC and chemistry panel reviewed.  Labs adequate to proceed as above.  We discussed the markedly elevated blood sugar.  We discussed avoiding sweets and maintaining adequate hydration.  We discussed a dose of insulin while in the office and also discussed evaluation in the emergency room.  He declines both of these.  He will check his blood sugar when he gets home.  I recommended he follow-up with his PCP as soon as possible.  He will return for lab and Sandostatin in 4 weeks.  Plan for restaging Netspot and office visit in 8 weeks.  He will contact  the office in the interim with any problems.    Michael Good ANP/GNP-BC    12/29/2021  3:59 PM

## 2021-12-30 ENCOUNTER — Telehealth: Payer: Self-pay

## 2021-12-30 ENCOUNTER — Other Ambulatory Visit: Payer: Self-pay

## 2021-12-30 LAB — CHROMOGRANIN A: Chromogranin A (ng/mL): 1309 ng/mL — ABNORMAL HIGH (ref 0.0–101.8)

## 2021-12-30 NOTE — Telephone Encounter (Signed)
Patient gave verbal understanding and had no further questions or concerns  

## 2021-12-30 NOTE — Telephone Encounter (Signed)
-----   Message from Ladell Pier, MD sent at 12/30/2021  3:38 PM EDT ----- Please call patient the chromogranin A is lower, follow-up as scheduled

## 2021-12-31 ENCOUNTER — Other Ambulatory Visit (HOSPITAL_COMMUNITY): Payer: Self-pay

## 2022-01-24 ENCOUNTER — Other Ambulatory Visit: Payer: Self-pay | Admitting: Nurse Practitioner

## 2022-01-25 ENCOUNTER — Other Ambulatory Visit (HOSPITAL_COMMUNITY): Payer: Self-pay

## 2022-01-25 ENCOUNTER — Other Ambulatory Visit: Payer: Self-pay | Admitting: Oncology

## 2022-01-25 DIAGNOSIS — C7A090 Malignant carcinoid tumor of the bronchus and lung: Secondary | ICD-10-CM

## 2022-01-26 ENCOUNTER — Encounter (HOSPITAL_COMMUNITY): Payer: Medicare Other

## 2022-01-26 ENCOUNTER — Other Ambulatory Visit (HOSPITAL_COMMUNITY): Payer: Self-pay

## 2022-01-26 ENCOUNTER — Inpatient Hospital Stay: Payer: Medicare Other

## 2022-01-26 ENCOUNTER — Ambulatory Visit: Payer: Medicare Other

## 2022-01-26 ENCOUNTER — Other Ambulatory Visit: Payer: Medicare Other

## 2022-01-26 ENCOUNTER — Telehealth: Payer: Self-pay | Admitting: *Deleted

## 2022-01-26 MED ORDER — EVEROLIMUS 7.5 MG PO TABS
ORAL_TABLET | Freq: Every day | ORAL | 1 refills | Status: DC
  Filled 2022-01-26: qty 30, 30d supply, fill #0
  Filled 2022-02-22: qty 30, 30d supply, fill #1

## 2022-01-26 NOTE — Telephone Encounter (Signed)
Michael Good called requesting to move his PET scan to 01/31/22 and reports he has not been able to get through to scheduling to request change. Called [facility] and PET moved to 01/31/22 at 0900/0930. Attempted to contact him with new appointment and left VM for him to call office nurse line for new appointment information. Will also need to move his lab/injection appointment to 12/4

## 2022-01-26 NOTE — Telephone Encounter (Signed)
Got call from [facility] that the vendor can not provide the contrast for PET on 01/31/22. The have the prior 12/6 available or 12/01 at 3 pm or 12/7 at 3 pm.  Called Mr. Grimsley with this information and he reports he can't come except on a Wednesday (preferred) or Monday due to his work. Provided him the direct phone # to [facility] to negotiate his PET scan.

## 2022-01-27 ENCOUNTER — Other Ambulatory Visit (HOSPITAL_COMMUNITY): Payer: Self-pay

## 2022-01-28 ENCOUNTER — Other Ambulatory Visit (HOSPITAL_COMMUNITY): Payer: Self-pay

## 2022-01-31 ENCOUNTER — Other Ambulatory Visit (HOSPITAL_COMMUNITY): Payer: Medicare Other

## 2022-02-02 ENCOUNTER — Other Ambulatory Visit: Payer: Self-pay

## 2022-02-02 ENCOUNTER — Inpatient Hospital Stay: Payer: Medicare Other | Attending: Oncology

## 2022-02-02 ENCOUNTER — Other Ambulatory Visit (HOSPITAL_COMMUNITY): Payer: Medicare Other

## 2022-02-02 ENCOUNTER — Inpatient Hospital Stay: Payer: Medicare Other

## 2022-02-02 ENCOUNTER — Other Ambulatory Visit: Payer: Medicare Other

## 2022-02-02 ENCOUNTER — Ambulatory Visit: Payer: Medicare Other

## 2022-02-02 ENCOUNTER — Encounter (HOSPITAL_COMMUNITY)
Admission: RE | Admit: 2022-02-02 | Discharge: 2022-02-02 | Disposition: A | Discharge status: Home / Self Care | Payer: Medicare Other | Source: Ambulatory Visit | Attending: Nurse Practitioner | Admitting: Nurse Practitioner

## 2022-02-02 VITALS — BP 138/74 | HR 76 | Temp 98.2°F | Resp 18

## 2022-02-02 DIAGNOSIS — C7A090 Malignant carcinoid tumor of the bronchus and lung: Secondary | ICD-10-CM | POA: Insufficient documentation

## 2022-02-02 DIAGNOSIS — C7B02 Secondary carcinoid tumors of liver: Secondary | ICD-10-CM | POA: Diagnosis present

## 2022-02-02 DIAGNOSIS — C7B03 Secondary carcinoid tumors of bone: Secondary | ICD-10-CM | POA: Diagnosis present

## 2022-02-02 LAB — CBC WITH DIFFERENTIAL (CANCER CENTER ONLY)
Abs Immature Granulocytes: 0.02 10*3/uL (ref 0.00–0.07)
Basophils Absolute: 0 10*3/uL (ref 0.0–0.1)
Basophils Relative: 0 %
Eosinophils Absolute: 0.1 10*3/uL (ref 0.0–0.5)
Eosinophils Relative: 1 %
HCT: 31 % — ABNORMAL LOW (ref 39.0–52.0)
Hemoglobin: 11.5 g/dL — ABNORMAL LOW (ref 13.0–17.0)
Immature Granulocytes: 0 %
Lymphocytes Relative: 22 %
Lymphs Abs: 1 10*3/uL (ref 0.7–4.0)
MCH: 30.6 pg (ref 26.0–34.0)
MCHC: 37.1 g/dL — ABNORMAL HIGH (ref 30.0–36.0)
MCV: 82.4 fL (ref 80.0–100.0)
Monocytes Absolute: 0.3 10*3/uL (ref 0.1–1.0)
Monocytes Relative: 7 %
Neutro Abs: 3.3 10*3/uL (ref 1.7–7.7)
Neutrophils Relative %: 70 %
Platelet Count: 99 10*3/uL — ABNORMAL LOW (ref 150–400)
RBC: 3.76 MIL/uL — ABNORMAL LOW (ref 4.22–5.81)
RDW: 12.5 % (ref 11.5–15.5)
WBC Count: 4.7 10*3/uL (ref 4.0–10.5)
nRBC: 0 % (ref 0.0–0.2)

## 2022-02-02 LAB — CMP (CANCER CENTER ONLY)
ALT: 30 U/L (ref 0–44)
AST: 22 U/L (ref 15–41)
Albumin: 3.9 g/dL (ref 3.5–5.0)
Alkaline Phosphatase: 151 U/L — ABNORMAL HIGH (ref 38–126)
Anion gap: 5 (ref 5–15)
BUN: 20 mg/dL (ref 8–23)
CO2: 28 mmol/L (ref 22–32)
Calcium: 9.2 mg/dL (ref 8.9–10.3)
Chloride: 101 mmol/L (ref 98–111)
Creatinine: 0.96 mg/dL (ref 0.61–1.24)
GFR, Estimated: >60 mL/min (ref >60)
Glucose, Bld: 374 mg/dL — ABNORMAL HIGH (ref 70–99)
Potassium: 4 mmol/L (ref 3.5–5.1)
Sodium: 134 mmol/L — ABNORMAL LOW (ref 135–145)
Total Bilirubin: 1 mg/dL (ref 0.3–1.2)
Total Protein: 6.8 g/dL (ref 6.5–8.1)

## 2022-02-02 MED ORDER — COPPER CU 64 DOTATATE 1 MCI/ML IV SOLN
4.0000 | Freq: Once | INTRAVENOUS | Status: AC
  Administered 2022-02-02: 4.09 via INTRAVENOUS

## 2022-02-02 MED ORDER — OCTREOTIDE ACETATE 30 MG IM KIT
30.0000 mg | PACK | Freq: Once | INTRAMUSCULAR | Status: AC
  Administered 2022-02-02: 30 mg via INTRAMUSCULAR
  Filled 2022-02-02: qty 1

## 2022-02-07 NOTE — Progress Notes (Addendum)
Attempting to reach patient w/normal PET results. 12/11: No answer-fast busy signal 12/12: No answer-fast busy signal 12/13: No answer-fast busy signal.

## 2022-02-17 ENCOUNTER — Other Ambulatory Visit (HOSPITAL_COMMUNITY): Payer: Self-pay

## 2022-02-22 ENCOUNTER — Other Ambulatory Visit (HOSPITAL_COMMUNITY): Payer: Self-pay

## 2022-02-23 ENCOUNTER — Ambulatory Visit: Payer: Medicare Other

## 2022-02-23 ENCOUNTER — Ambulatory Visit: Payer: Medicare Other | Admitting: Oncology

## 2022-03-01 ENCOUNTER — Inpatient Hospital Stay: Payer: Medicare Other | Admitting: Oncology

## 2022-03-01 ENCOUNTER — Inpatient Hospital Stay: Payer: Medicare Other | Attending: Oncology

## 2022-03-01 VITALS — BP 142/88 | HR 72 | Temp 98.2°F | Resp 18 | Ht 73.0 in | Wt 176.8 lb

## 2022-03-01 DIAGNOSIS — C7A090 Malignant carcinoid tumor of the bronchus and lung: Secondary | ICD-10-CM | POA: Insufficient documentation

## 2022-03-01 DIAGNOSIS — C7B03 Secondary carcinoid tumors of bone: Secondary | ICD-10-CM | POA: Insufficient documentation

## 2022-03-01 DIAGNOSIS — C7B02 Secondary carcinoid tumors of liver: Secondary | ICD-10-CM | POA: Insufficient documentation

## 2022-03-01 MED ORDER — OCTREOTIDE ACETATE 30 MG IM KIT
30.0000 mg | PACK | Freq: Once | INTRAMUSCULAR | Status: AC
  Administered 2022-03-01: 30 mg via INTRAMUSCULAR
  Filled 2022-03-01: qty 1

## 2022-03-01 NOTE — Patient Instructions (Signed)
Octreotide Injection Solution What is this medication? OCTREOTIDE (ok TREE oh tide) treats high levels of growth hormone (acromegaly). It works by reducing the amount of growth hormone your body makes. This reduces symptoms and the risk of health problems caused by too much growth hormone, such as diabetes and heart disease. It may also be used to treat diarrhea caused by neuroendocrine tumors. It works by slowing down the release of serotonin from the tumor cells. This reduces the number of bowel movements you have. This medicine may be used for other purposes; ask your health care provider or pharmacist if you have questions. COMMON BRAND NAME(S): Bynfezia, Sandostatin What should I tell my care team before I take this medication? They need to know if you have any of these conditions: Diabetes Gallbladder disease Kidney disease Liver disease Thyroid disease An unusual or allergic reaction to octreotide, other medications, foods, dyes, or preservatives Pregnant or trying to get pregnant Breast-feeding How should I use this medication? This medication is injected under the skin or into a vein. It is usually given by your care team in a hospital or clinic setting. If you get this medication at home, you will be taught how to prepare and give it. Use exactly as directed. Take it as directed on the prescription label at the same time every day. Keep taking it unless your care team tells you to stop. Allow the injection solution to come to room temperature before use. Do not warm it artificially. It is important that you put your used needles and syringes in a special sharps container. Do not put them in a trash can. If you do not have a sharps container, call your pharmacist or care team to get one. Talk to your care team about the use of this medication in children. Special care may be needed. Overdosage: If you think you have taken too much of this medicine contact a poison control center or  emergency room at once. NOTE: This medicine is only for you. Do not share this medicine with others. What if I miss a dose? If you miss a dose, take it as soon as you can. If it is almost time for your next dose, take only that dose. Do not take double or extra doses. What may interact with this medication? Bromocriptine Certain medications for blood pressure, heart disease, irregular heartbeat Cyclosporine Diuretics Medications for diabetes, including insulin Quinidine This list may not describe all possible interactions. Give your health care provider a list of all the medicines, herbs, non-prescription drugs, or dietary supplements you use. Also tell them if you smoke, drink alcohol, or use illegal drugs. Some items may interact with your medicine. What should I watch for while using this medication? Visit your care team for regular checks on your progress. Tell your care team if your symptoms do not start to get better or if they get worse. To help reduce irritation at the injection site, use a different site for each injection and make sure the solution is at room temperature before use. This medication may cause decreases in blood sugar. Signs of low blood sugar include chills, cool, pale skin or cold sweats, drowsiness, extreme hunger, fast heartbeat, headache, nausea, nervousness or anxiety, shakiness, trembling, unsteadiness, tiredness, or weakness. Contact your care team right away if you experience any of these symptoms. This medication may increase blood sugar. The risk may be higher in patients who already have diabetes. Ask your care team what you can do to lower your   risk of diabetes while taking this medication. You should make sure you get enough vitamin B12 while you are taking this medication. Discuss the foods you eat and the vitamins you take with your care team. What side effects may I notice from receiving this medication? Side effects that you should report to your care  team as soon as possible: Allergic reactions--skin rash, itching, hives, swelling of the face, lips, tongue, or throat Gallbladder problems--severe stomach pain, nausea, vomiting, fever Heart rhythm changes--fast or irregular heartbeat, dizziness, feeling faint or lightheaded, chest pain, trouble breathing High blood sugar (hyperglycemia)--increased thirst or amount of urine, unusual weakness or fatigue, blurry vision Low blood sugar (hypoglycemia)--tremors or shaking, anxiety, sweating, cold or clammy skin, confusion, dizziness, rapid heartbeat Low thyroid levels (hypothyroidism)--unusual weakness or fatigue, increased sensitivity to cold, constipation, hair loss, dry skin, weight gain, feelings of depression Low vitamin B12 level--pain, tingling, or numbness in the hands or feet, muscle weakness, dizziness, confusion, trouble concentrating Pancreatitis--severe stomach pain that spreads to your back or gets worse after eating or when touched, fever, nausea, vomiting Side effects that usually do not require medical attention (report to your care team if they continue or are bothersome): Diarrhea Dizziness Gas Headache Pain, redness, or irritation at injection site Stomach pain This list may not describe all possible side effects. Call your doctor for medical advice about side effects. You may report side effects to FDA at 1-800-FDA-1088. Where should I keep my medication? Keep out of the reach of children and pets. Store in the refrigerator. Protect from light. Allow to come to room temperature naturally. Do not use artificial heat. If protected from light, the injection may be stored between 20 and 30 degrees C (70 and 86 degrees F) for 14 days. After the initial use, throw away any unused portion of a multiple dose vial after 14 days. Get rid of any unused portions of the ampules after use. To get rid of medications that are no longer needed or have expired: Take the medication to a medication  take-back program. Ask your pharmacy or law enforcement to find a location. If you cannot return the medication, ask your pharmacist or care team how to get rid of the medication safely. NOTE: This sheet is a summary. It may not cover all possible information. If you have questions about this medicine, talk to your doctor, pharmacist, or health care provider.  2023 Elsevier/Gold Standard (2021-05-19 00:00:00)  

## 2022-03-02 ENCOUNTER — Encounter: Payer: Self-pay | Admitting: Nutrition

## 2022-03-02 NOTE — Progress Notes (Signed)
Patient given one case of Ensure HP.

## 2022-03-08 ENCOUNTER — Encounter: Payer: Self-pay | Admitting: *Deleted

## 2022-03-08 NOTE — Progress Notes (Signed)
Michael Good called to cancel his appointment for OV/injection on 03/29/22 stating he can only come in on Wednesdays late in the day. Asking to move his injection to late on 2/7 and does not feel he needs to see MD.  Last MD visit was 12/30/22. He did not come in for his 12/27 lab/injection. Expects provider to call him to discuss his PET results of 02/02/22. Message to MD requesting to advise on how to proceed with patient's requests. 03/09/22: Per Dr. Benay Spice: Scan is unchanged compared to August 2022 Can schedule telephone (does not have MyChart) visit to review the scan and discuss current status Can have this appointment late any day except Wednesday

## 2022-03-09 ENCOUNTER — Telehealth: Payer: Self-pay | Admitting: *Deleted

## 2022-03-09 NOTE — Telephone Encounter (Signed)
Called Michael Good regarding his scan is stable compared to August 2022. Can have telephone visit with him to discuss any day late except for Wednesday. Scheduler will call him to set these appointments up. Informed him no issue with late Wednesday injection, just provider visit.

## 2022-03-16 ENCOUNTER — Other Ambulatory Visit (HOSPITAL_COMMUNITY): Payer: Self-pay

## 2022-03-25 ENCOUNTER — Other Ambulatory Visit (HOSPITAL_COMMUNITY): Payer: Self-pay

## 2022-03-29 ENCOUNTER — Ambulatory Visit: Payer: Medicare Other

## 2022-03-29 ENCOUNTER — Ambulatory Visit: Payer: Medicare Other | Admitting: Oncology

## 2022-03-30 ENCOUNTER — Other Ambulatory Visit (HOSPITAL_COMMUNITY): Payer: Self-pay

## 2022-04-01 ENCOUNTER — Other Ambulatory Visit (HOSPITAL_COMMUNITY): Payer: Self-pay

## 2022-04-01 ENCOUNTER — Other Ambulatory Visit: Payer: Self-pay | Admitting: Oncology

## 2022-04-01 DIAGNOSIS — C7A090 Malignant carcinoid tumor of the bronchus and lung: Secondary | ICD-10-CM

## 2022-04-01 MED ORDER — EVEROLIMUS 7.5 MG PO TABS
ORAL_TABLET | Freq: Every day | ORAL | 1 refills | Status: DC
  Filled 2022-04-01: qty 30, 30d supply, fill #0
  Filled 2022-05-02: qty 30, 30d supply, fill #1

## 2022-04-04 ENCOUNTER — Other Ambulatory Visit: Payer: Self-pay

## 2022-04-04 ENCOUNTER — Other Ambulatory Visit (HOSPITAL_COMMUNITY): Payer: Self-pay

## 2022-04-06 ENCOUNTER — Inpatient Hospital Stay: Payer: Medicare Other | Attending: Oncology

## 2022-04-06 ENCOUNTER — Inpatient Hospital Stay: Payer: Medicare Other

## 2022-04-06 VITALS — BP 139/74 | HR 75 | Temp 98.2°F | Resp 18 | Ht 73.0 in | Wt 177.0 lb

## 2022-04-06 DIAGNOSIS — C7B02 Secondary carcinoid tumors of liver: Secondary | ICD-10-CM | POA: Diagnosis present

## 2022-04-06 DIAGNOSIS — C7B03 Secondary carcinoid tumors of bone: Secondary | ICD-10-CM | POA: Diagnosis present

## 2022-04-06 DIAGNOSIS — C7A090 Malignant carcinoid tumor of the bronchus and lung: Secondary | ICD-10-CM | POA: Insufficient documentation

## 2022-04-06 LAB — CMP (CANCER CENTER ONLY)
ALT: 34 U/L (ref 0–44)
AST: 27 U/L (ref 15–41)
Albumin: 4.3 g/dL (ref 3.5–5.0)
Alkaline Phosphatase: 102 U/L (ref 38–126)
Anion gap: 8 (ref 5–15)
BUN: 15 mg/dL (ref 8–23)
CO2: 28 mmol/L (ref 22–32)
Calcium: 9 mg/dL (ref 8.9–10.3)
Chloride: 100 mmol/L (ref 98–111)
Creatinine: 1.03 mg/dL (ref 0.61–1.24)
GFR, Estimated: >60 mL/min (ref >60)
Glucose, Bld: 468 mg/dL — ABNORMAL HIGH (ref 70–99)
Potassium: 3.7 mmol/L (ref 3.5–5.1)
Sodium: 136 mmol/L (ref 135–145)
Total Bilirubin: 1 mg/dL (ref 0.3–1.2)
Total Protein: 7 g/dL (ref 6.5–8.1)

## 2022-04-06 LAB — CBC WITH DIFFERENTIAL (CANCER CENTER ONLY)
Abs Immature Granulocytes: 0.01 10*3/uL (ref 0.00–0.07)
Basophils Absolute: 0 10*3/uL (ref 0.0–0.1)
Basophils Relative: 0 %
Eosinophils Absolute: 0.1 10*3/uL (ref 0.0–0.5)
Eosinophils Relative: 2 %
HCT: 34.3 % — ABNORMAL LOW (ref 39.0–52.0)
Hemoglobin: 12.8 g/dL — ABNORMAL LOW (ref 13.0–17.0)
Immature Granulocytes: 0 %
Lymphocytes Relative: 25 %
Lymphs Abs: 0.8 10*3/uL (ref 0.7–4.0)
MCH: 30.9 pg (ref 26.0–34.0)
MCHC: 37.3 g/dL — ABNORMAL HIGH (ref 30.0–36.0)
MCV: 82.9 fL (ref 80.0–100.0)
Monocytes Absolute: 0.2 10*3/uL (ref 0.1–1.0)
Monocytes Relative: 7 %
Neutro Abs: 2 10*3/uL (ref 1.7–7.7)
Neutrophils Relative %: 66 %
Platelet Count: 79 10*3/uL — ABNORMAL LOW (ref 150–400)
RBC: 4.14 MIL/uL — ABNORMAL LOW (ref 4.22–5.81)
RDW: 13.2 % (ref 11.5–15.5)
WBC Count: 3.1 10*3/uL — ABNORMAL LOW (ref 4.0–10.5)
nRBC: 0 % (ref 0.0–0.2)

## 2022-04-06 IMAGING — CT NM PET SKULL BASE TO THIGH
7 series · 25 of 25 positions shown · non-contrast
Comparison: 09/18/2019

CLINICAL DATA: Subsequent treatment strategy for carcinoid tumor.

EXAM:
NUCLEAR MEDICINE PET SKULL BASE TO THIGH
TECHNIQUE: 4.83 mCi Ga 68 DOTATATE was injected intravenously. Full-ring PET
imaging was performed from the skull base to thigh after the
radiotracer. CT data was obtained and used for attenuation
correction and anatomic localization.

[Series 3: pet sk_thigh ac · axial · 5.0mm · 4.07mm/px · z∈[-1298,-282]mm · 5 of 255 slices shown]
[im 1/255]
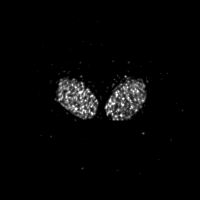
[im 64/255]
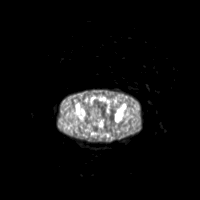
[im 128/255]
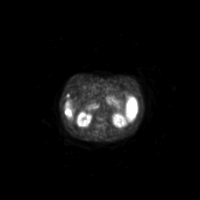
[im 191/255]
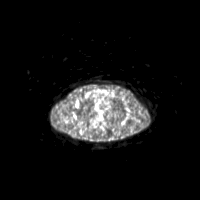
[im 255/255]
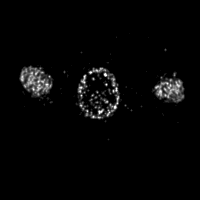

[Series 4: ct sk_thigh 5.0 bf37 · axial · 5.0mm · 0.98mm/px · z∈[-1298,-282]mm · 6 of 254 slices shown]
[im 1/254]
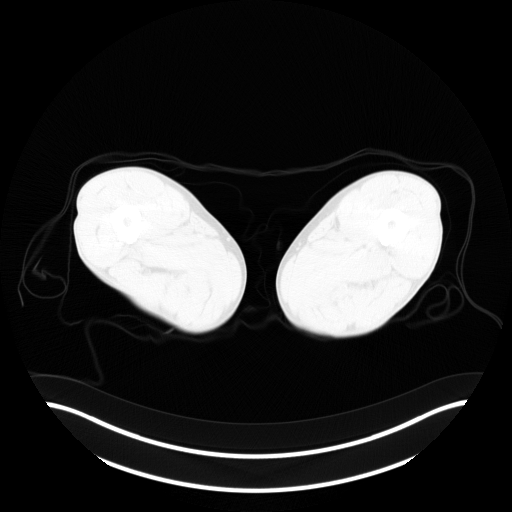
[im 51/254]
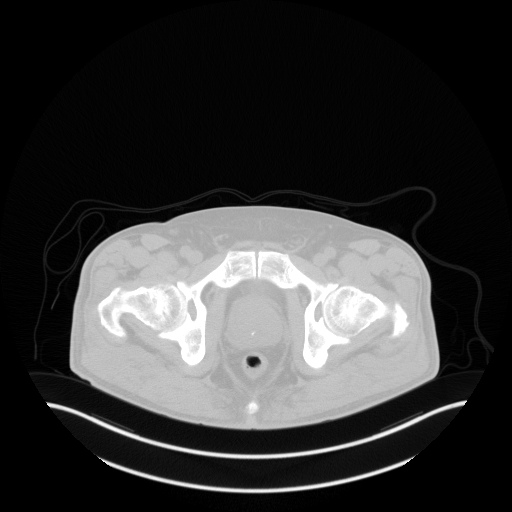
[im 102/254]
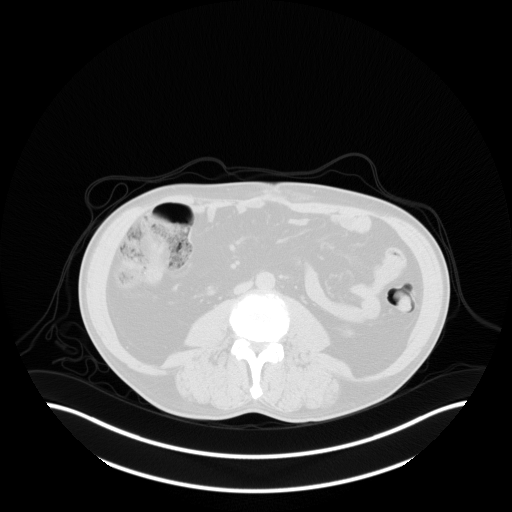
[im 152/254]
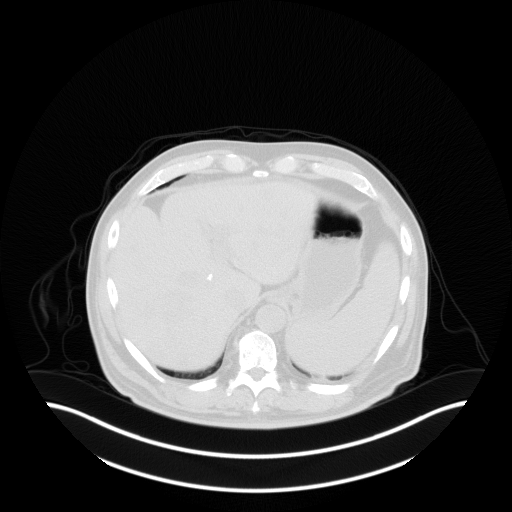
[im 203/254]
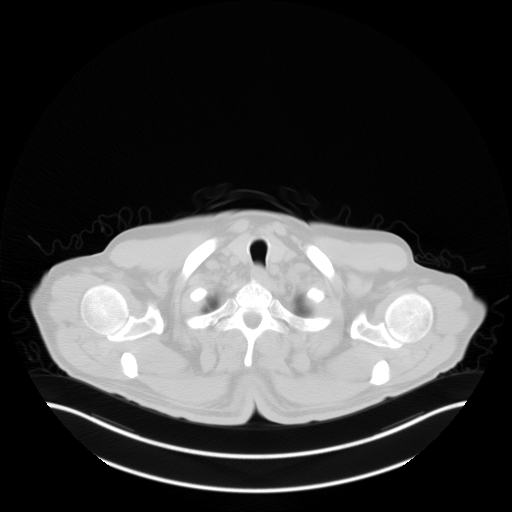
[im 254/254  brain]
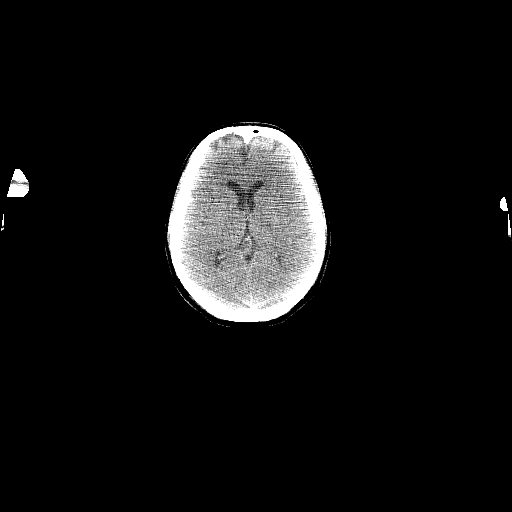

[Series 5: pet sk_thigh nac · axial · 5.0mm · 4.07mm/px · z∈[-1298,-282]mm · 6 of 255 slices shown]
[im 1/255]
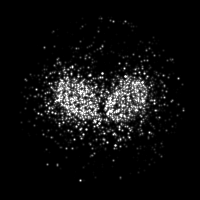
[im 51/255]
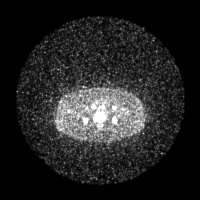
[im 102/255]
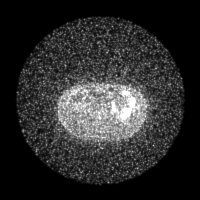
[im 153/255]
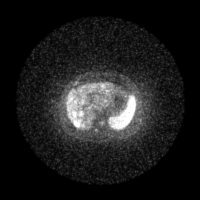
[im 204/255]
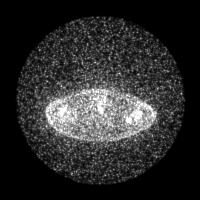
[im 255/255]
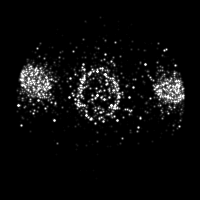

[Series 8: ct sk_thigh 5.0 br59 lung_bone · axial · 5.0mm · 0.68mm/px · 1 of 62 slices shown]
[im 1/62]
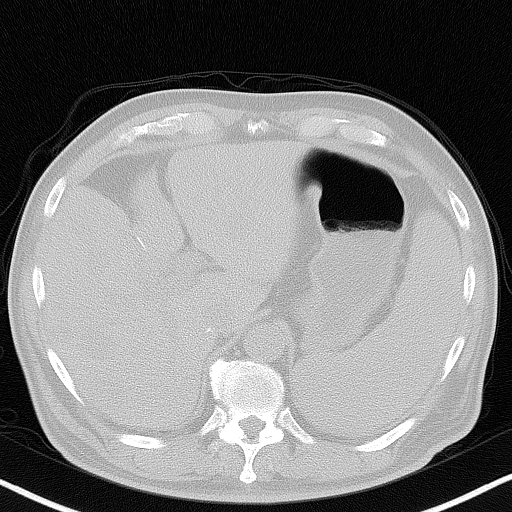

[Series 603: <mip collection> · coronal · 2.11mm/px · 1 of 32 slices shown]
[im 1/32]
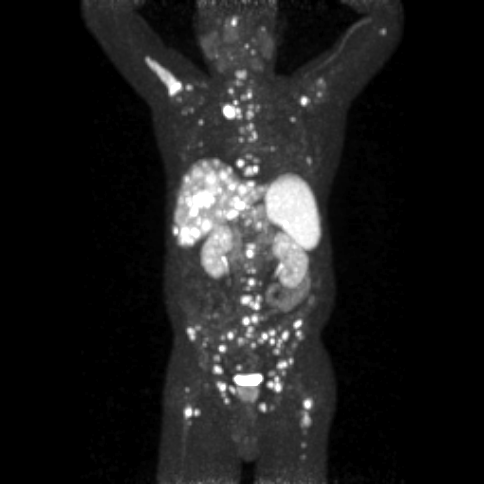

[Series 604: fused cor · 1 of 46 slices shown]
[im 1/46]
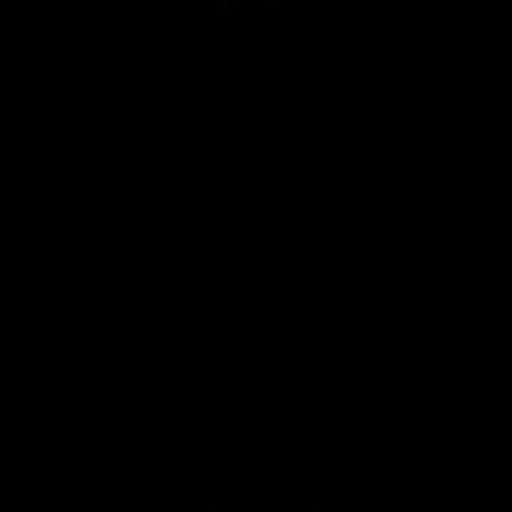

[Series 605: range-ct sk_thigh 5.0 bf37-tra-<alpha range> · 5 of 245 slices shown]
[im 1/245]
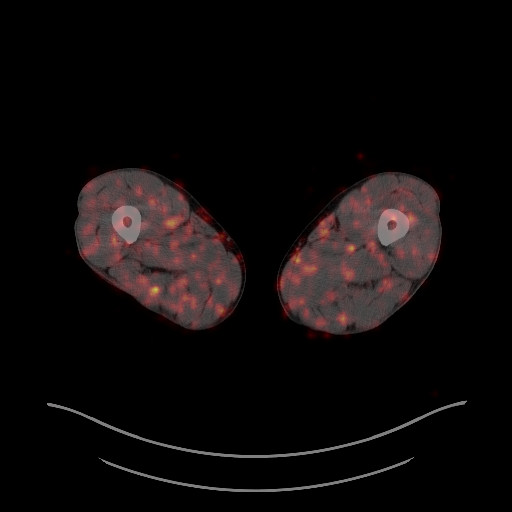
[im 62/245]
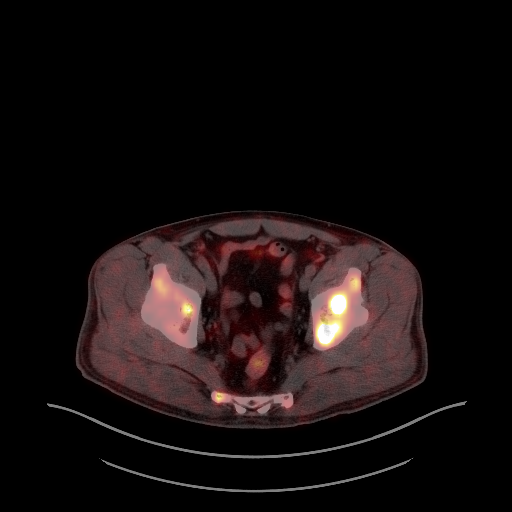
[im 123/245]
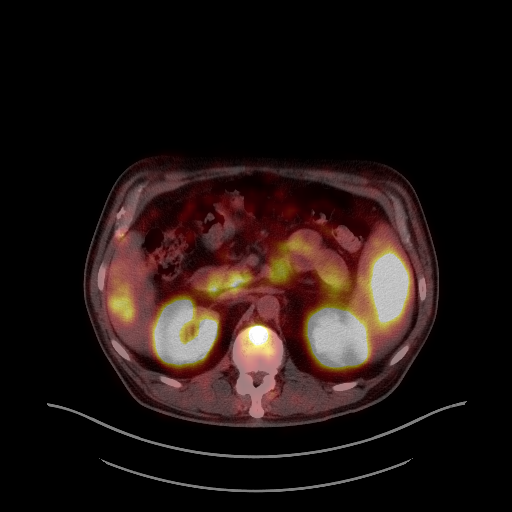
[im 184/245]
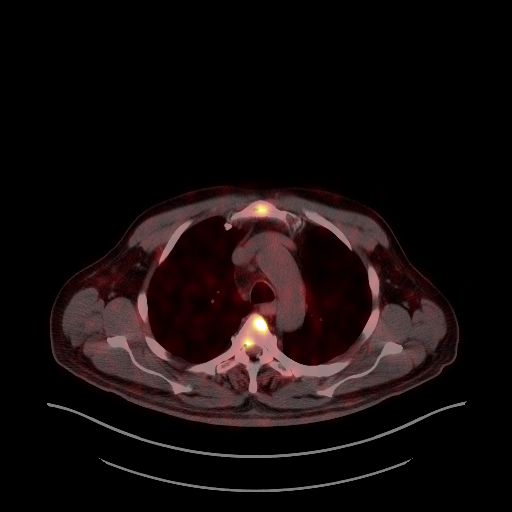
[im 245/245]
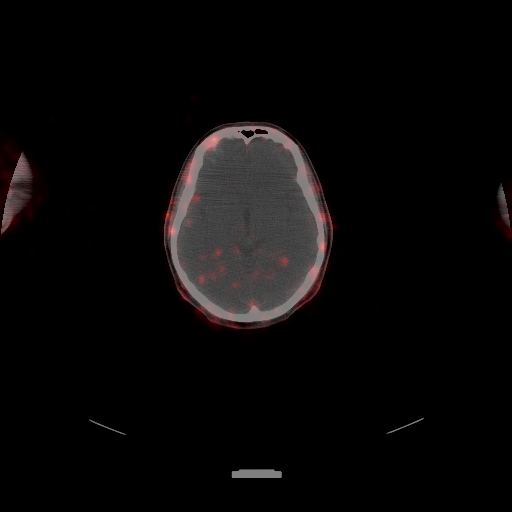

[25 of 25 positions shown; findings below may reference images not displayed]

FINDINGS: NECK

No radiotracer activity in neck lymph nodes.

Incidental CT findings: None

CHEST

No radiotracer accumulation within mediastinal or hilar lymph nodes.
No suspicious pulmonary nodules on the CT scan.

Incidental CT finding:Mild cardiac enlargement. Coronary artery
calcifications.

ABDOMEN/PELVIS

Extensive tracer avid liver metastases is again identified. As
before these are too numerous to count, but appear similar in
multiplicity to the previous exam. (Note: Given hypervascular the
nature of neuroendocrine metastasis these are difficult to reliably
measure anatomically on unenhanced CT. On the CT portion of today's
exam these lesions are more apparent when compared with the CT
portion of the PET-CT from 09/18/2019, likely reflecting differences
in technique.)

-Index lesion within the anterior aspect of segment 2 measures
cm within SUV max of 22, image 102/4. Previously this lesion
measured 3.6 cm with SUV max of 28.

-Index lesion within the inferior aspect of segment 3 measures
cm within SUV max of 28.96, image 116/4. Formally measured
approximately 2.2 cm with SUV max of 29.9.

-Index lesion within segment [DATE] measures 2.3 cm within SUV max of
29.74, image 130/4. Formally this measured approximately 3 cm with
SUV max of 27.6.

-Tracer avid lesion within tail of pancreas has an SUV max of
unchanged from previous exam.

The spleen remains enlarged with a cranial caudal dimension of
15.17. Previously this was measured at 18.

Physiologic activity noted in the liver, spleen, adrenal glands and
kidneys.

Incidental CT findings:Prostate gland enlargement with heterogeneous
activity, nonspecific. Mild aortic atherosclerosis.

SKELETON

Extensive tracer avid bone metastases are again identified.

-Index lesion within the left anterior acetabulum has an SUV max of
22.78. Formally 34.54.

-Index lesion involving the right clavicular head has an SUV max of
69.85. Formally 86.08.

-Considerable decrease in tracer uptake associated with previous
left humeral lesion. SUV max is currently 6.39. Previously 33.75.

-Tracer avid L1 vertebral body lesion has an SUV max of 28.96.
Previously 35.7.

Incidental CT findings:None
IMPRESSION: 1. Innumeral tracer avid liver metastases are again noted.
Subjectively these are unchanged in multiple. No significant change
in degree of tracer uptake associated with the index lesions as
described above.
2. Stable degree of tracer uptake associated with distal tail of
pancreas lesion.
3. Innumerable tracer avid bone lesions. With the exception of the
right humeral lesion, these are not significantly changed in
multiplicity. The right humeral lesion demonstrates considerable
decrease in tracer uptake compared with previous exam. The other
index lesions show mild decrease in degree of FDG uptake.
4. No sites of disease progression identified.

## 2022-04-06 MED ORDER — OCTREOTIDE ACETATE 30 MG IM KIT
30.0000 mg | PACK | Freq: Once | INTRAMUSCULAR | Status: AC
  Administered 2022-04-06: 30 mg via INTRAMUSCULAR
  Filled 2022-04-06: qty 1

## 2022-04-06 NOTE — Patient Instructions (Signed)
Octreotide Injection Solution What is this medication? OCTREOTIDE (ok TREE oh tide) treats high levels of growth hormone (acromegaly). It works by reducing the amount of growth hormone your body makes. This reduces symptoms and the risk of health problems caused by too much growth hormone, such as diabetes and heart disease. It may also be used to treat diarrhea caused by neuroendocrine tumors. It works by slowing down the release of serotonin from the tumor cells. This reduces the number of bowel movements you have. This medicine may be used for other purposes; ask your health care provider or pharmacist if you have questions. COMMON BRAND NAME(S): Bynfezia, Sandostatin What should I tell my care team before I take this medication? They need to know if you have any of these conditions: Diabetes Gallbladder disease Kidney disease Liver disease Thyroid disease An unusual or allergic reaction to octreotide, other medications, foods, dyes, or preservatives Pregnant or trying to get pregnant Breast-feeding How should I use this medication? This medication is injected under the skin or into a vein. It is usually given by your care team in a hospital or clinic setting. If you get this medication at home, you will be taught how to prepare and give it. Use exactly as directed. Take it as directed on the prescription label at the same time every day. Keep taking it unless your care team tells you to stop. Allow the injection solution to come to room temperature before use. Do not warm it artificially. It is important that you put your used needles and syringes in a special sharps container. Do not put them in a trash can. If you do not have a sharps container, call your pharmacist or care team to get one. Talk to your care team about the use of this medication in children. Special care may be needed. Overdosage: If you think you have taken too much of this medicine contact a poison control center or  emergency room at once. NOTE: This medicine is only for you. Do not share this medicine with others. What if I miss a dose? If you miss a dose, take it as soon as you can. If it is almost time for your next dose, take only that dose. Do not take double or extra doses. What may interact with this medication? Bromocriptine Certain medications for blood pressure, heart disease, irregular heartbeat Cyclosporine Diuretics Medications for diabetes, including insulin Quinidine This list may not describe all possible interactions. Give your health care provider a list of all the medicines, herbs, non-prescription drugs, or dietary supplements you use. Also tell them if you smoke, drink alcohol, or use illegal drugs. Some items may interact with your medicine. What should I watch for while using this medication? Visit your care team for regular checks on your progress. Tell your care team if your symptoms do not start to get better or if they get worse. To help reduce irritation at the injection site, use a different site for each injection and make sure the solution is at room temperature before use. This medication may cause decreases in blood sugar. Signs of low blood sugar include chills, cool, pale skin or cold sweats, drowsiness, extreme hunger, fast heartbeat, headache, nausea, nervousness or anxiety, shakiness, trembling, unsteadiness, tiredness, or weakness. Contact your care team right away if you experience any of these symptoms. This medication may increase blood sugar. The risk may be higher in patients who already have diabetes. Ask your care team what you can do to lower your   risk of diabetes while taking this medication. You should make sure you get enough vitamin B12 while you are taking this medication. Discuss the foods you eat and the vitamins you take with your care team. What side effects may I notice from receiving this medication? Side effects that you should report to your care  team as soon as possible: Allergic reactions--skin rash, itching, hives, swelling of the face, lips, tongue, or throat Gallbladder problems--severe stomach pain, nausea, vomiting, fever Heart rhythm changes--fast or irregular heartbeat, dizziness, feeling faint or lightheaded, chest pain, trouble breathing High blood sugar (hyperglycemia)--increased thirst or amount of urine, unusual weakness or fatigue, blurry vision Low blood sugar (hypoglycemia)--tremors or shaking, anxiety, sweating, cold or clammy skin, confusion, dizziness, rapid heartbeat Low thyroid levels (hypothyroidism)--unusual weakness or fatigue, increased sensitivity to cold, constipation, hair loss, dry skin, weight gain, feelings of depression Low vitamin B12 level--pain, tingling, or numbness in the hands or feet, muscle weakness, dizziness, confusion, trouble concentrating Pancreatitis--severe stomach pain that spreads to your back or gets worse after eating or when touched, fever, nausea, vomiting Side effects that usually do not require medical attention (report to your care team if they continue or are bothersome): Diarrhea Dizziness Gas Headache Pain, redness, or irritation at injection site Stomach pain This list may not describe all possible side effects. Call your doctor for medical advice about side effects. You may report side effects to FDA at 1-800-FDA-1088. Where should I keep my medication? Keep out of the reach of children and pets. Store in the refrigerator. Protect from light. Allow to come to room temperature naturally. Do not use artificial heat. If protected from light, the injection may be stored between 20 and 30 degrees C (70 and 86 degrees F) for 14 days. After the initial use, throw away any unused portion of a multiple dose vial after 14 days. Get rid of any unused portions of the ampules after use. To get rid of medications that are no longer needed or have expired: Take the medication to a medication  take-back program. Ask your pharmacy or law enforcement to find a location. If you cannot return the medication, ask your pharmacist or care team how to get rid of the medication safely. NOTE: This sheet is a summary. It may not cover all possible information. If you have questions about this medicine, talk to your doctor, pharmacist, or health care provider.  2023 Elsevier/Gold Standard (2021-05-19 00:00:00)

## 2022-04-07 ENCOUNTER — Inpatient Hospital Stay (HOSPITAL_BASED_OUTPATIENT_CLINIC_OR_DEPARTMENT_OTHER): Payer: Medicare Other | Admitting: Oncology

## 2022-04-07 ENCOUNTER — Encounter: Payer: Self-pay | Admitting: Nutrition

## 2022-04-07 DIAGNOSIS — C7A090 Malignant carcinoid tumor of the bronchus and lung: Secondary | ICD-10-CM | POA: Diagnosis not present

## 2022-04-07 NOTE — Progress Notes (Signed)
Patient provided one complimentary case of ensure plus HP.

## 2022-04-07 NOTE — Progress Notes (Signed)
Michael Good OFFICE VISIT PROGRESS NOTE  I connected with Michael Good 04/07/22 at  4:20 PM EST by telephone and verified that I am speaking with the correct person using two identifiers.   I discussed the limitations, risks, security and privacy concerns of performing an evaluation and management service by telemedicine and the availability of in-person appointments. I also discussed with the patient that there may be a patient responsible charge related to this service. The patient expressed understanding and agreed to proceed.  Patient's location: Home Provider's location: Office   Diagnosis: Metastatic carcinoid tumor  INTERVAL HISTORY:   Michael Good is seen today telephone visit by his request.  He continues monthly Sandostatin, last given yesterday.  He reports anorexia.  He has "bumps "at the scalp intermittently prior to Sandostatin injections.  These resolved after the injection.  No consistent diarrhea.  No mouth sores.  He continues everolimus.  Objective:   Lab Results  Component Value Date   WBC 3.1 (L) 04/06/2022   HGB 12.8 (L) 04/06/2022   HCT 34.3 (L) 04/06/2022   MCV 82.9 04/06/2022   PLT 79 (L) 04/06/2022   NEUTROABS 2.0 04/06/2022    Imaging: Dotatate PET images from 02/02/2022 reviewed   Medications: I have reviewed the patient's current medications.  Assessment/Plan: Metastatic carcinoid tumor Left lower lobectomy February 2004 at [facility], 2.2 cm typical carcinoid tumor Laparoscopic cholecystectomy November 2014, imaging revealed a 4.4 cm segment 8 liver lesion 03/18/2013-central hepatectomy and drainage of intra-abdominal abscess,- low-grade neuroendocrine carcinoma, tumor appeared totally excised,Ki-67 less than 5% CT's 05/09/2014, 08/16/2016, 09/19/2017-no evidence of recurrent disease CTs 914 2020-3 new liver lesions 12/26/2018 PET dotatate scan-at least 20 liver lesions and extensive bone metastases,  hypermetabolic pancreas tail lesion 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 01/16/2019-biopsy of the left hepatic lobe lesion-metastatic neuroendocrine carcinoma,Ki-67 10%- interpretation limited due to scant remaining tumor may not be representative of the overall lesion, mismatch repair protein staining intact 02/27/2019-Sandostatin 30 mg every 4 weeks 09/18/2019- Netspot PET-increased activity in the pancreas tail and liver and bone metastases Late August 2021-everolimus, 7.5 mg daily, placed on hold September 2021 due to acute hyperglycemia/new onset diabetes 01/22/2020-everolimus resumed Netspot 09/29/2020-innumerable liver metastases, unchanged, stable tracer uptake in distal pancreas lesion, innumerable bone lesions, significant decrease in tracer uptake in left humeral lesion, mild decrease in FDG uptake in other bone lesions, no new site of metastatic disease Netspot 02/02/2022-no change from scan on 09/29/2020, multifocal hepatic and bone metastases.  Small tail of the pancreas lesion Sandostatin and everolimus continued Diabetes ERCP removal of bile duct stones March 2015, March 2018 Sleep apnea Hypertension COVID-19 infection September 2021 Cholecystectomy November 1497 Umbilical hernia repair 0/26/3785 Erectile dysfunction Chronic thrombocytopenia  Disposition: Michael Good has a metastatic carcinoid tumor, likely arising in the lung.  The restaging PET reveal stable disease.  He will continue monthly Sandostatin and everolimus.  The blood sugar was markedly elevated when he was here yesterday.  I recommended he avoid concentrated sweets and follow-up with his primary provider for management of diabetes.  He will continue monthly Sandostatin.  He will return for an office visit in 3 months.   I discussed the assessment and treatment plan with the patient. The patient was provided an opportunity to ask questions and all were answered. The patient agreed with the plan and  demonstrated an understanding of the instructions.   The patient was advised to call back or seek an in-person evaluation if the  symptoms worsen or if the condition fails to improve as anticipated.  I provided 20 minutes of chart review, telephone, and documentation time during this encounter, and > 50% was spent counseling as documented under my assessment & plan.  Michael Good ANP/GNP-BC   04/07/2022 4:22 PM

## 2022-04-08 LAB — CHROMOGRANIN A: Chromogranin A (ng/mL): 1859 ng/mL — ABNORMAL HIGH (ref 0.0–101.8)

## 2022-04-28 ENCOUNTER — Other Ambulatory Visit (HOSPITAL_COMMUNITY): Payer: Self-pay

## 2022-05-02 ENCOUNTER — Other Ambulatory Visit: Payer: Self-pay

## 2022-05-02 ENCOUNTER — Other Ambulatory Visit (HOSPITAL_COMMUNITY): Payer: Self-pay

## 2022-05-03 ENCOUNTER — Other Ambulatory Visit: Payer: Self-pay

## 2022-05-03 ENCOUNTER — Other Ambulatory Visit (HOSPITAL_COMMUNITY): Payer: Self-pay

## 2022-05-04 ENCOUNTER — Inpatient Hospital Stay: Payer: Medicare Other

## 2022-05-04 ENCOUNTER — Other Ambulatory Visit: Payer: Self-pay

## 2022-05-04 ENCOUNTER — Other Ambulatory Visit (HOSPITAL_COMMUNITY): Payer: Self-pay

## 2022-05-05 ENCOUNTER — Other Ambulatory Visit: Payer: Self-pay

## 2022-05-11 ENCOUNTER — Telehealth: Payer: Self-pay | Admitting: Oncology

## 2022-05-11 ENCOUNTER — Inpatient Hospital Stay: Payer: Medicare Other

## 2022-05-11 ENCOUNTER — Telehealth: Payer: Self-pay | Admitting: *Deleted

## 2022-05-11 ENCOUNTER — Encounter: Payer: Self-pay | Admitting: *Deleted

## 2022-05-11 ENCOUNTER — Inpatient Hospital Stay: Payer: Medicare Other | Attending: Oncology

## 2022-05-11 VITALS — BP 143/75 | HR 80 | Temp 98.1°F | Resp 18 | Ht 73.0 in | Wt 171.6 lb

## 2022-05-11 DIAGNOSIS — C7A090 Malignant carcinoid tumor of the bronchus and lung: Secondary | ICD-10-CM

## 2022-05-11 DIAGNOSIS — C7B02 Secondary carcinoid tumors of liver: Secondary | ICD-10-CM | POA: Diagnosis present

## 2022-05-11 DIAGNOSIS — C7B03 Secondary carcinoid tumors of bone: Secondary | ICD-10-CM | POA: Insufficient documentation

## 2022-05-11 LAB — CBC WITH DIFFERENTIAL (CANCER CENTER ONLY)
Abs Immature Granulocytes: 0.02 10*3/uL (ref 0.00–0.07)
Basophils Absolute: 0 10*3/uL (ref 0.0–0.1)
Basophils Relative: 0 %
Eosinophils Absolute: 0 10*3/uL (ref 0.0–0.5)
Eosinophils Relative: 1 %
HCT: 30.2 % — ABNORMAL LOW (ref 39.0–52.0)
Hemoglobin: 11.3 g/dL — ABNORMAL LOW (ref 13.0–17.0)
Immature Granulocytes: 1 %
Lymphocytes Relative: 21 %
Lymphs Abs: 0.7 10*3/uL (ref 0.7–4.0)
MCH: 30.1 pg (ref 26.0–34.0)
MCHC: 37.4 g/dL — ABNORMAL HIGH (ref 30.0–36.0)
MCV: 80.5 fL (ref 80.0–100.0)
Monocytes Absolute: 0.5 10*3/uL (ref 0.1–1.0)
Monocytes Relative: 13 %
Neutro Abs: 2.2 10*3/uL (ref 1.7–7.7)
Neutrophils Relative %: 64 %
Platelet Count: 99 10*3/uL — ABNORMAL LOW (ref 150–400)
RBC: 3.75 MIL/uL — ABNORMAL LOW (ref 4.22–5.81)
RDW: 13.2 % (ref 11.5–15.5)
WBC Count: 3.5 10*3/uL — ABNORMAL LOW (ref 4.0–10.5)
nRBC: 0 % (ref 0.0–0.2)

## 2022-05-11 LAB — CMP (CANCER CENTER ONLY)
ALT: 31 U/L (ref 0–44)
AST: 30 U/L (ref 15–41)
Albumin: 4.3 g/dL (ref 3.5–5.0)
Alkaline Phosphatase: 120 U/L (ref 38–126)
Anion gap: 8 (ref 5–15)
BUN: 19 mg/dL (ref 8–23)
CO2: 29 mmol/L (ref 22–32)
Calcium: 9.3 mg/dL (ref 8.9–10.3)
Chloride: 97 mmol/L — ABNORMAL LOW (ref 98–111)
Creatinine: 1.07 mg/dL (ref 0.61–1.24)
GFR, Estimated: >60 mL/min (ref >60)
Glucose, Bld: 401 mg/dL — ABNORMAL HIGH (ref 70–99)
Potassium: 3.8 mmol/L (ref 3.5–5.1)
Sodium: 134 mmol/L — ABNORMAL LOW (ref 135–145)
Total Bilirubin: 2.1 mg/dL — ABNORMAL HIGH (ref 0.3–1.2)
Total Protein: 7.4 g/dL (ref 6.5–8.1)

## 2022-05-11 MED ORDER — OCTREOTIDE ACETATE 30 MG IM KIT
30.0000 mg | PACK | Freq: Once | INTRAMUSCULAR | Status: AC
  Administered 2022-05-11: 30 mg via INTRAMUSCULAR
  Filled 2022-05-11: qty 1

## 2022-05-11 NOTE — Telephone Encounter (Signed)
Made Mr. Postiglione aware his elevated glucose and need to follow diabetic diet and consult with his PCP regarding diabetes management. He reports that he will, but does not recall name of his PCP. Confirmed w/pharmacy and then [facility] in [place], [place] that he sees Ephriam Jenkins, NP.  Faxed labs to [phone].

## 2022-05-11 NOTE — Progress Notes (Signed)
Provided Mr. Trottier 1 case of vanilla Ensure.

## 2022-05-11 NOTE — Telephone Encounter (Signed)
Attempted to contact patient in regards to reschedule per voicemail. No answer, phone sounds busy or disconnected. So no voicemail was able to be left

## 2022-05-11 NOTE — Patient Instructions (Signed)
Octreotide Injection Solution What is this medication? OCTREOTIDE (ok TREE oh tide) treats high levels of growth hormone (acromegaly). It works by reducing the amount of growth hormone your body makes. This reduces symptoms and the risk of health problems caused by too much growth hormone, such as diabetes and heart disease. It may also be used to treat diarrhea caused by neuroendocrine tumors. It works by slowing down the release of serotonin from the tumor cells. This reduces the number of bowel movements you have. This medicine may be used for other purposes; ask your health care provider or pharmacist if you have questions. COMMON BRAND NAME(S): Bynfezia, Sandostatin What should I tell my care team before I take this medication? They need to know if you have any of these conditions: Diabetes Gallbladder disease Kidney disease Liver disease Thyroid disease An unusual or allergic reaction to octreotide, other medications, foods, dyes, or preservatives Pregnant or trying to get pregnant Breast-feeding How should I use this medication? This medication is injected under the skin or into a vein. It is usually given by your care team in a hospital or clinic setting. If you get this medication at home, you will be taught how to prepare and give it. Use exactly as directed. Take it as directed on the prescription label at the same time every day. Keep taking it unless your care team tells you to stop. Allow the injection solution to come to room temperature before use. Do not warm it artificially. It is important that you put your used needles and syringes in a special sharps container. Do not put them in a trash can. If you do not have a sharps container, call your pharmacist or care team to get one. Talk to your care team about the use of this medication in children. Special care may be needed. Overdosage: If you think you have taken too much of this medicine contact a poison control center or  emergency room at once. NOTE: This medicine is only for you. Do not share this medicine with others. What if I miss a dose? If you miss a dose, take it as soon as you can. If it is almost time for your next dose, take only that dose. Do not take double or extra doses. What may interact with this medication? Bromocriptine Certain medications for blood pressure, heart disease, irregular heartbeat Cyclosporine Diuretics Medications for diabetes, including insulin Quinidine This list may not describe all possible interactions. Give your health care provider a list of all the medicines, herbs, non-prescription drugs, or dietary supplements you use. Also tell them if you smoke, drink alcohol, or use illegal drugs. Some items may interact with your medicine. What should I watch for while using this medication? Visit your care team for regular checks on your progress. Tell your care team if your symptoms do not start to get better or if they get worse. To help reduce irritation at the injection site, use a different site for each injection and make sure the solution is at room temperature before use. This medication may cause decreases in blood sugar. Signs of low blood sugar include chills, cool, pale skin or cold sweats, drowsiness, extreme hunger, fast heartbeat, headache, nausea, nervousness or anxiety, shakiness, trembling, unsteadiness, tiredness, or weakness. Contact your care team right away if you experience any of these symptoms. This medication may increase blood sugar. The risk may be higher in patients who already have diabetes. Ask your care team what you can do to lower your   risk of diabetes while taking this medication. You should make sure you get enough vitamin B12 while you are taking this medication. Discuss the foods you eat and the vitamins you take with your care team. What side effects may I notice from receiving this medication? Side effects that you should report to your care  team as soon as possible: Allergic reactions--skin rash, itching, hives, swelling of the face, lips, tongue, or throat Gallbladder problems--severe stomach pain, nausea, vomiting, fever Heart rhythm changes--fast or irregular heartbeat, dizziness, feeling faint or lightheaded, chest pain, trouble breathing High blood sugar (hyperglycemia)--increased thirst or amount of urine, unusual weakness or fatigue, blurry vision Low blood sugar (hypoglycemia)--tremors or shaking, anxiety, sweating, cold or clammy skin, confusion, dizziness, rapid heartbeat Low thyroid levels (hypothyroidism)--unusual weakness or fatigue, increased sensitivity to cold, constipation, hair loss, dry skin, weight gain, feelings of depression Low vitamin B12 level--pain, tingling, or numbness in the hands or feet, muscle weakness, dizziness, confusion, trouble concentrating Pancreatitis--severe stomach pain that spreads to your back or gets worse after eating or when touched, fever, nausea, vomiting Side effects that usually do not require medical attention (report to your care team if they continue or are bothersome): Diarrhea Dizziness Gas Headache Pain, redness, or irritation at injection site Stomach pain This list may not describe all possible side effects. Call your doctor for medical advice about side effects. You may report side effects to FDA at 1-800-FDA-1088. Where should I keep my medication? Keep out of the reach of children and pets. Store in the refrigerator. Protect from light. Allow to come to room temperature naturally. Do not use artificial heat. If protected from light, the injection may be stored between 20 and 30 degrees C (70 and 86 degrees F) for 14 days. After the initial use, throw away any unused portion of a multiple dose vial after 14 days. Get rid of any unused portions of the ampules after use. To get rid of medications that are no longer needed or have expired: Take the medication to a medication  take-back program. Ask your pharmacy or law enforcement to find a location. If you cannot return the medication, ask your pharmacist or care team how to get rid of the medication safely. NOTE: This sheet is a summary. It may not cover all possible information. If you have questions about this medicine, talk to your doctor, pharmacist, or health care provider.  2023 Elsevier/Gold Standard (2021-05-19 00:00:00)  

## 2022-05-11 NOTE — Telephone Encounter (Signed)
error 

## 2022-05-13 LAB — CHROMOGRANIN A: Chromogranin A (ng/mL): 1170 ng/mL — ABNORMAL HIGH (ref 0.0–101.8)

## 2022-05-25 ENCOUNTER — Other Ambulatory Visit (HOSPITAL_COMMUNITY): Payer: Self-pay

## 2022-05-25 ENCOUNTER — Other Ambulatory Visit: Payer: Self-pay | Admitting: Oncology

## 2022-05-25 DIAGNOSIS — C7A090 Malignant carcinoid tumor of the bronchus and lung: Secondary | ICD-10-CM

## 2022-05-26 ENCOUNTER — Other Ambulatory Visit (HOSPITAL_COMMUNITY): Payer: Self-pay

## 2022-05-26 ENCOUNTER — Other Ambulatory Visit: Payer: Self-pay

## 2022-05-26 ENCOUNTER — Encounter: Payer: Self-pay | Admitting: Oncology

## 2022-05-26 MED ORDER — EVEROLIMUS 7.5 MG PO TABS
ORAL_TABLET | Freq: Every day | ORAL | 1 refills | Status: DC
  Filled 2022-05-26: qty 30, 30d supply, fill #0
  Filled 2022-06-23: qty 30, 30d supply, fill #1

## 2022-05-31 ENCOUNTER — Other Ambulatory Visit: Payer: Self-pay

## 2022-06-01 ENCOUNTER — Inpatient Hospital Stay: Payer: Medicare Other

## 2022-06-08 ENCOUNTER — Inpatient Hospital Stay: Payer: Medicare Other | Attending: Oncology

## 2022-06-08 ENCOUNTER — Inpatient Hospital Stay: Payer: Medicare Other

## 2022-06-08 VITALS — BP 145/75 | HR 71 | Temp 97.8°F | Resp 19 | Ht 73.0 in | Wt 173.4 lb

## 2022-06-08 DIAGNOSIS — C7A090 Malignant carcinoid tumor of the bronchus and lung: Secondary | ICD-10-CM | POA: Insufficient documentation

## 2022-06-08 DIAGNOSIS — C7B02 Secondary carcinoid tumors of liver: Secondary | ICD-10-CM | POA: Insufficient documentation

## 2022-06-08 DIAGNOSIS — C7B03 Secondary carcinoid tumors of bone: Secondary | ICD-10-CM | POA: Diagnosis present

## 2022-06-08 LAB — CBC WITH DIFFERENTIAL (CANCER CENTER ONLY)
Abs Immature Granulocytes: 0.01 10*3/uL (ref 0.00–0.07)
Basophils Absolute: 0 10*3/uL (ref 0.0–0.1)
Basophils Relative: 0 %
Eosinophils Absolute: 0 10*3/uL (ref 0.0–0.5)
Eosinophils Relative: 1 %
HCT: 34.5 % — ABNORMAL LOW (ref 39.0–52.0)
Hemoglobin: 12.7 g/dL — ABNORMAL LOW (ref 13.0–17.0)
Immature Granulocytes: 0 %
Lymphocytes Relative: 29 %
Lymphs Abs: 0.9 10*3/uL (ref 0.7–4.0)
MCH: 30 pg (ref 26.0–34.0)
MCHC: 36.8 g/dL — ABNORMAL HIGH (ref 30.0–36.0)
MCV: 81.6 fL (ref 80.0–100.0)
Monocytes Absolute: 0.2 10*3/uL (ref 0.1–1.0)
Monocytes Relative: 6 %
Neutro Abs: 2 10*3/uL (ref 1.7–7.7)
Neutrophils Relative %: 64 %
Platelet Count: 70 10*3/uL — ABNORMAL LOW (ref 150–400)
RBC: 4.23 MIL/uL (ref 4.22–5.81)
RDW: 13.3 % (ref 11.5–15.5)
WBC Count: 3.1 10*3/uL — ABNORMAL LOW (ref 4.0–10.5)
nRBC: 0 % (ref 0.0–0.2)

## 2022-06-08 LAB — CMP (CANCER CENTER ONLY)
ALT: 37 U/L (ref 0–44)
AST: 31 U/L (ref 15–41)
Albumin: 4.3 g/dL (ref 3.5–5.0)
Alkaline Phosphatase: 141 U/L — ABNORMAL HIGH (ref 38–126)
Anion gap: 7 (ref 5–15)
BUN: 16 mg/dL (ref 8–23)
CO2: 27 mmol/L (ref 22–32)
Calcium: 9.1 mg/dL (ref 8.9–10.3)
Chloride: 100 mmol/L (ref 98–111)
Creatinine: 1.02 mg/dL (ref 0.61–1.24)
GFR, Estimated: >60 mL/min (ref >60)
Glucose, Bld: 472 mg/dL — ABNORMAL HIGH (ref 70–99)
Potassium: 3.9 mmol/L (ref 3.5–5.1)
Sodium: 134 mmol/L — ABNORMAL LOW (ref 135–145)
Total Bilirubin: 1.1 mg/dL (ref 0.3–1.2)
Total Protein: 6.8 g/dL (ref 6.5–8.1)

## 2022-06-08 MED ORDER — OCTREOTIDE ACETATE 30 MG IM KIT
30.0000 mg | PACK | Freq: Once | INTRAMUSCULAR | Status: AC
  Administered 2022-06-08: 30 mg via INTRAMUSCULAR
  Filled 2022-06-08: qty 1

## 2022-06-08 NOTE — Patient Instructions (Signed)
[place] [facility]   Discharge Instructions: Thank you for choosing [facility] to provide your oncology and hematology care.   If you have a lab appointment with the Cancer Center, please go directly to the Cancer Center and check in at the registration area.   Wear comfortable clothing and clothing appropriate for easy access to any Portacath or PICC line.   We strive to give you quality time with your provider. You may need to reschedule your appointment if you arrive late (15 or more minutes).  Arriving late affects you and other patients whose appointments are after yours.  Also, if you miss three or more appointments without notifying the office, you may be dismissed from the clinic at the provider's discretion.      For prescription refill requests, have your pharmacy contact our office and allow 72 hours for refills to be completed.    Today you received the following chemotherapy and/or immunotherapy agents Sandostatin.      To help prevent nausea and vomiting after your treatment, we encourage you to take your nausea medication as directed.  BELOW ARE SYMPTOMS THAT SHOULD BE REPORTED IMMEDIATELY: *FEVER GREATER THAN 100.4 F (38 C) OR HIGHER *CHILLS OR SWEATING *NAUSEA AND VOMITING THAT IS NOT CONTROLLED WITH YOUR NAUSEA MEDICATION *UNUSUAL SHORTNESS OF BREATH *UNUSUAL BRUISING OR BLEEDING *URINARY PROBLEMS (pain or burning when urinating, or frequent urination) *BOWEL PROBLEMS (unusual diarrhea, constipation, pain near the anus) TENDERNESS IN MOUTH AND THROAT WITH OR WITHOUT PRESENCE OF ULCERS (sore throat, sores in mouth, or a toothache) UNUSUAL RASH, SWELLING OR PAIN  UNUSUAL VAGINAL DISCHARGE OR ITCHING   Items with * indicate a potential emergency and should be followed up as soon as possible or go to the Emergency Department if any problems should occur.  Please show the CHEMOTHERAPY ALERT CARD or IMMUNOTHERAPY ALERT CARD at  check-in to the Emergency Department and triage nurse.  Should you have questions after your visit or need to cancel or reschedule your appointment, please contact [place] [facility]  Dept: [phone]  and follow the prompts.  Office hours are 8:00 a.m. to 4:30 p.m. Monday - Friday. Please note that voicemails left after 4:00 p.m. may not be returned until the following business day.  We are closed weekends and major holidays. You have access to a nurse at all times for urgent questions. Please call the main number to the clinic Dept: [phone] and follow the prompts.   For any non-urgent questions, you may also contact your provider using MyChart. We now offer e-Visits for anyone 5118 and older to request care online for non-urgent symptoms. For details visit [email or web address].   Also download the MyChart app! Go to the app store, search "MyChart", open the app, select [place], and log in with your MyChart username and password.  Octreotide Injection Solution What is this medication? OCTREOTIDE (ok TREE oh tide) treats high levels of growth hormone (acromegaly). It works by reducing the amount of growth hormone your body makes. This reduces symptoms and the risk of health problems caused by too much growth hormone, such as diabetes and heart disease. It may also be used to treat diarrhea caused by neuroendocrine tumors. It works by slowing down the release of serotonin from the tumor cells. This reduces the number of bowel movements you have. This medicine may be used for other purposes; ask your health care provider or pharmacist if you have questions. COMMON BRAND  NAME(S): Berline Lopes, Sandostatin What should I tell my care team before I take this medication? They need to know if you have any of these conditions: Diabetes Gallbladder disease Kidney disease Liver disease Thyroid disease An unusual or allergic reaction to octreotide, other medications,  foods, dyes, or preservatives Pregnant or trying to get pregnant Breast-feeding How should I use this medication? This medication is injected under the skin or into a vein. It is usually given by your care team in a hospital or clinic setting. If you get this medication at home, you will be taught how to prepare and give it. Use exactly as directed. Take it as directed on the prescription label at the same time every day. Keep taking it unless your care team tells you to stop. Allow the injection solution to come to room temperature before use. Do not warm it artificially. It is important that you put your used needles and syringes in a special sharps container. Do not put them in a trash can. If you do not have a sharps container, call your pharmacist or care team to get one. Talk to your care team about the use of this medication in children. Special care may be needed. Overdosage: If you think you have taken too much of this medicine contact a poison control center or emergency room at once. NOTE: This medicine is only for you. Do not share this medicine with others. What if I miss a dose? If you miss a dose, take it as soon as you can. If it is almost time for your next dose, take only that dose. Do not take double or extra doses. What may interact with this medication? Bromocriptine Certain medications for blood pressure, heart disease, irregular heartbeat Cyclosporine Diuretics Medications for diabetes, including insulin Quinidine This list may not describe all possible interactions. Give your health care provider a list of all the medicines, herbs, non-prescription drugs, or dietary supplements you use. Also tell them if you smoke, drink alcohol, or use illegal drugs. Some items may interact with your medicine. What should I watch for while using this medication? Visit your care team for regular checks on your progress. Tell your care team if your symptoms do not start to get better or  if they get worse. To help reduce irritation at the injection site, use a different site for each injection and make sure the solution is at room temperature before use. This medication may cause decreases in blood sugar. Signs of low blood sugar include chills, cool, pale skin or cold sweats, drowsiness, extreme hunger, fast heartbeat, headache, nausea, nervousness or anxiety, shakiness, trembling, unsteadiness, tiredness, or weakness. Contact your care team right away if you experience any of these symptoms. This medication may increase blood sugar. The risk may be higher in patients who already have diabetes. Ask your care team what you can do to lower your risk of diabetes while taking this medication. You should make sure you get enough vitamin B12 while you are taking this medication. Discuss the foods you eat and the vitamins you take with your care team. What side effects may I notice from receiving this medication? Side effects that you should report to your care team as soon as possible: Allergic reactions--skin rash, itching, hives, swelling of the face, lips, tongue, or throat Gallbladder problems--severe stomach pain, nausea, vomiting, fever Heart rhythm changes--fast or irregular heartbeat, dizziness, feeling faint or lightheaded, chest pain, trouble breathing High blood sugar (hyperglycemia)--increased thirst or amount of urine, unusual weakness  or fatigue, blurry vision Low blood sugar (hypoglycemia)--tremors or shaking, anxiety, sweating, cold or clammy skin, confusion, dizziness, rapid heartbeat Low thyroid levels (hypothyroidism)--unusual weakness or fatigue, increased sensitivity to cold, constipation, hair loss, dry skin, weight gain, feelings of depression Low vitamin B12 level--pain, tingling, or numbness in the hands or feet, muscle weakness, dizziness, confusion, trouble concentrating Pancreatitis--severe stomach pain that spreads to your back or gets worse after eating or when  touched, fever, nausea, vomiting Side effects that usually do not require medical attention (report to your care team if they continue or are bothersome): Diarrhea Dizziness Gas Headache Pain, redness, or irritation at injection site Stomach pain This list may not describe all possible side effects. Call your doctor for medical advice about side effects. You may report side effects to FDA at 1-800-FDA-1088. Where should I keep my medication? Keep out of the reach of children and pets. Store in the refrigerator. Protect from light. Allow to come to room temperature naturally. Do not use artificial heat. If protected from light, the injection may be stored between 20 and 30 degrees C (70 and 86 degrees F) for 14 days. After the initial use, throw away any unused portion of a multiple dose vial after 14 days. Get rid of any unused portions of the ampules after use. To get rid of medications that are no longer needed or have expired: Take the medication to a medication take-back program. Ask your pharmacy or law enforcement to find a location. If you cannot return the medication, ask your pharmacist or care team how to get rid of the medication safely. NOTE: This sheet is a summary. It may not cover all possible information. If you have questions about this medicine, talk to your doctor, pharmacist, or health care provider.  2023 Elsevier/Gold Standard (2021-05-19 00:00:00)

## 2022-06-10 NOTE — Progress Notes (Signed)
I attempted to contact the patient multiple times without success. I will try reaching out again.

## 2022-06-14 ENCOUNTER — Telehealth: Payer: Self-pay

## 2022-06-14 NOTE — Telephone Encounter (Signed)
I have sent the lab results to the patient and included a note from Dr. Truett Perna in the envelope advising him to continue taking everolimus as prescribed.

## 2022-06-23 ENCOUNTER — Other Ambulatory Visit (HOSPITAL_COMMUNITY): Payer: Self-pay

## 2022-06-27 ENCOUNTER — Inpatient Hospital Stay: Payer: Medicare Other | Admitting: Oncology

## 2022-06-27 ENCOUNTER — Inpatient Hospital Stay: Payer: Medicare Other

## 2022-06-28 ENCOUNTER — Other Ambulatory Visit (HOSPITAL_COMMUNITY): Payer: Self-pay

## 2022-06-30 ENCOUNTER — Ambulatory Visit: Payer: Medicare Other | Admitting: Oncology

## 2022-06-30 ENCOUNTER — Ambulatory Visit: Payer: Medicare Other

## 2022-06-30 ENCOUNTER — Other Ambulatory Visit: Payer: Medicare Other

## 2022-07-18 ENCOUNTER — Ambulatory Visit: Payer: Medicare Other

## 2022-07-18 ENCOUNTER — Ambulatory Visit: Payer: Medicare Other | Admitting: Oncology

## 2022-07-18 ENCOUNTER — Other Ambulatory Visit: Payer: Medicare Other

## 2022-07-19 ENCOUNTER — Inpatient Hospital Stay (HOSPITAL_BASED_OUTPATIENT_CLINIC_OR_DEPARTMENT_OTHER): Payer: Medicare Other | Admitting: Oncology

## 2022-07-19 ENCOUNTER — Inpatient Hospital Stay: Payer: Medicare Other

## 2022-07-19 ENCOUNTER — Inpatient Hospital Stay: Payer: Medicare Other | Attending: Oncology

## 2022-07-19 ENCOUNTER — Encounter: Payer: Self-pay | Admitting: *Deleted

## 2022-07-19 VITALS — BP 137/70 | HR 80 | Temp 98.1°F | Resp 18 | Ht 73.0 in | Wt 173.0 lb

## 2022-07-19 DIAGNOSIS — C7B03 Secondary carcinoid tumors of bone: Secondary | ICD-10-CM | POA: Insufficient documentation

## 2022-07-19 DIAGNOSIS — D696 Thrombocytopenia, unspecified: Secondary | ICD-10-CM | POA: Insufficient documentation

## 2022-07-19 DIAGNOSIS — Z79899 Other long term (current) drug therapy: Secondary | ICD-10-CM | POA: Insufficient documentation

## 2022-07-19 DIAGNOSIS — G4730 Sleep apnea, unspecified: Secondary | ICD-10-CM | POA: Diagnosis not present

## 2022-07-19 DIAGNOSIS — N529 Male erectile dysfunction, unspecified: Secondary | ICD-10-CM | POA: Diagnosis not present

## 2022-07-19 DIAGNOSIS — C7A090 Malignant carcinoid tumor of the bronchus and lung: Secondary | ICD-10-CM

## 2022-07-19 DIAGNOSIS — C7B02 Secondary carcinoid tumors of liver: Secondary | ICD-10-CM | POA: Diagnosis present

## 2022-07-19 DIAGNOSIS — R197 Diarrhea, unspecified: Secondary | ICD-10-CM | POA: Insufficient documentation

## 2022-07-19 DIAGNOSIS — I10 Essential (primary) hypertension: Secondary | ICD-10-CM | POA: Diagnosis not present

## 2022-07-19 DIAGNOSIS — E119 Type 2 diabetes mellitus without complications: Secondary | ICD-10-CM | POA: Insufficient documentation

## 2022-07-19 LAB — CBC WITH DIFFERENTIAL (CANCER CENTER ONLY)
Abs Immature Granulocytes: 0.01 10*3/uL (ref 0.00–0.07)
Basophils Absolute: 0 10*3/uL (ref 0.0–0.1)
Basophils Relative: 0 %
Eosinophils Absolute: 0.1 10*3/uL (ref 0.0–0.5)
Eosinophils Relative: 3 %
HCT: 31.8 % — ABNORMAL LOW (ref 39.0–52.0)
Hemoglobin: 11.7 g/dL — ABNORMAL LOW (ref 13.0–17.0)
Immature Granulocytes: 0 %
Lymphocytes Relative: 36 %
Lymphs Abs: 0.8 10*3/uL (ref 0.7–4.0)
MCH: 29.8 pg (ref 26.0–34.0)
MCHC: 36.8 g/dL — ABNORMAL HIGH (ref 30.0–36.0)
MCV: 81.1 fL (ref 80.0–100.0)
Monocytes Absolute: 0.3 10*3/uL (ref 0.1–1.0)
Monocytes Relative: 11 %
Neutro Abs: 1.1 10*3/uL — ABNORMAL LOW (ref 1.7–7.7)
Neutrophils Relative %: 50 %
Platelet Count: 70 10*3/uL — ABNORMAL LOW (ref 150–400)
RBC: 3.92 MIL/uL — ABNORMAL LOW (ref 4.22–5.81)
RDW: 13.2 % (ref 11.5–15.5)
WBC Count: 2.2 10*3/uL — ABNORMAL LOW (ref 4.0–10.5)
nRBC: 0 % (ref 0.0–0.2)

## 2022-07-19 LAB — CEA (ACCESS): CEA (CHCC): 2.77 ng/mL (ref 0.00–5.00)

## 2022-07-19 MED ORDER — OCTREOTIDE ACETATE 30 MG IM KIT
30.0000 mg | PACK | Freq: Once | INTRAMUSCULAR | Status: AC
  Administered 2022-07-19: 30 mg via INTRAMUSCULAR
  Filled 2022-07-19: qty 1

## 2022-07-19 NOTE — Progress Notes (Signed)
Provided Mr. Mcaden a case of Ensure and coupons for poor nutrition and continued weight loss.

## 2022-07-19 NOTE — Patient Instructions (Signed)
[place] [facility] [address]   Discharge Instructions: Thank you for choosing [facility] to provide your oncology and hematology care.   If you have a lab appointment with the Cancer Center, please go directly to the Cancer Center and check in at the registration area.   Wear comfortable clothing and clothing appropriate for easy access to any Portacath or PICC line.   We strive to give you quality time with your provider. You may need to reschedule your appointment if you arrive late (15 or more minutes).  Arriving late affects you and other patients whose appointments are after yours.  Also, if you miss three or more appointments without notifying the office, you may be dismissed from the clinic at the provider's discretion.      For prescription refill requests, have your pharmacy contact our office and allow 72 hours for refills to be completed.    Today you received the following chemotherapy and/or immunotherapy agents Sandostatin.      To help prevent nausea and vomiting after your treatment, we encourage you to take your nausea medication as directed.  BELOW ARE SYMPTOMS THAT SHOULD BE REPORTED IMMEDIATELY: *FEVER GREATER THAN 100.4 F (38 C) OR HIGHER *CHILLS OR SWEATING *NAUSEA AND VOMITING THAT IS NOT CONTROLLED WITH YOUR NAUSEA MEDICATION *UNUSUAL SHORTNESS OF BREATH *UNUSUAL BRUISING OR BLEEDING *URINARY PROBLEMS (pain or burning when urinating, or frequent urination) *BOWEL PROBLEMS (unusual diarrhea, constipation, pain near the anus) TENDERNESS IN MOUTH AND THROAT WITH OR WITHOUT PRESENCE OF ULCERS (sore throat, sores in mouth, or a toothache) UNUSUAL RASH, SWELLING OR PAIN  UNUSUAL VAGINAL DISCHARGE OR ITCHING   Items with * indicate a potential emergency and should be followed up as soon as possible or go to the Emergency Department if any problems should occur.  Please show the CHEMOTHERAPY ALERT CARD or IMMUNOTHERAPY ALERT CARD at  check-in to the Emergency Department and triage nurse.  Should you have questions after your visit or need to cancel or reschedule your appointment, please contact [place] [facility] [address]  Dept: [phone]  and follow the prompts.  Office hours are 8:00 a.m. to 4:30 p.m. Monday - Friday. Please note that voicemails left after 4:00 p.m. may not be returned until the following business day.  We are closed weekends and major holidays. You have access to a nurse at all times for urgent questions. Please call the main number to the clinic Dept: [phone] and follow the prompts.   For any non-urgent questions, you may also contact your provider using MyChart. We now offer e-Visits for anyone 18 and older to request care online for non-urgent symptoms. For details visit [email or web address].   Also download the MyChart app! Go to the app store, search "MyChart", open the app, select [place], and log in with your MyChart username and password.  Octreotide Injection Solution What is this medication? OCTREOTIDE (ok TREE oh tide) treats high levels of growth hormone (acromegaly). It works by reducing the amount of growth hormone your body makes. This reduces symptoms and the risk of health problems caused by too much growth hormone, such as diabetes and heart disease. It may also be used to treat diarrhea caused by neuroendocrine tumors. It works by slowing down the release of serotonin from the tumor cells. This reduces the number of bowel movements you have. This medicine may be used for other purposes; ask your health care provider or pharmacist if you have questions. COMMON BRAND   NAME(S): Bynfezia, Sandostatin What should I tell my care team before I take this medication? They need to know if you have any of these conditions: Diabetes Gallbladder disease Kidney disease Liver disease Thyroid disease An unusual or allergic reaction to octreotide, other medications,  foods, dyes, or preservatives Pregnant or trying to get pregnant Breast-feeding How should I use this medication? This medication is injected under the skin or into a vein. It is usually given by your care team in a hospital or clinic setting. If you get this medication at home, you will be taught how to prepare and give it. Use exactly as directed. Take it as directed on the prescription label at the same time every day. Keep taking it unless your care team tells you to stop. Allow the injection solution to come to room temperature before use. Do not warm it artificially. It is important that you put your used needles and syringes in a special sharps container. Do not put them in a trash can. If you do not have a sharps container, call your pharmacist or care team to get one. Talk to your care team about the use of this medication in children. Special care may be needed. Overdosage: If you think you have taken too much of this medicine contact a poison control center or emergency room at once. NOTE: This medicine is only for you. Do not share this medicine with others. What if I miss a dose? If you miss a dose, take it as soon as you can. If it is almost time for your next dose, take only that dose. Do not take double or extra doses. What may interact with this medication? Bromocriptine Certain medications for blood pressure, heart disease, irregular heartbeat Cyclosporine Diuretics Medications for diabetes, including insulin Quinidine This list may not describe all possible interactions. Give your health care provider a list of all the medicines, herbs, non-prescription drugs, or dietary supplements you use. Also tell them if you smoke, drink alcohol, or use illegal drugs. Some items may interact with your medicine. What should I watch for while using this medication? Visit your care team for regular checks on your progress. Tell your care team if your symptoms do not start to get better or  if they get worse. To help reduce irritation at the injection site, use a different site for each injection and make sure the solution is at room temperature before use. This medication may cause decreases in blood sugar. Signs of low blood sugar include chills, cool, pale skin or cold sweats, drowsiness, extreme hunger, fast heartbeat, headache, nausea, nervousness or anxiety, shakiness, trembling, unsteadiness, tiredness, or weakness. Contact your care team right away if you experience any of these symptoms. This medication may increase blood sugar. The risk may be higher in patients who already have diabetes. Ask your care team what you can do to lower your risk of diabetes while taking this medication. You should make sure you get enough vitamin B12 while you are taking this medication. Discuss the foods you eat and the vitamins you take with your care team. What side effects may I notice from receiving this medication? Side effects that you should report to your care team as soon as possible: Allergic reactions--skin rash, itching, hives, swelling of the face, lips, tongue, or throat Gallbladder problems--severe stomach pain, nausea, vomiting, fever Heart rhythm changes--fast or irregular heartbeat, dizziness, feeling faint or lightheaded, chest pain, trouble breathing High blood sugar (hyperglycemia)--increased thirst or amount of urine, unusual weakness   or fatigue, blurry vision Low blood sugar (hypoglycemia)--tremors or shaking, anxiety, sweating, cold or clammy skin, confusion, dizziness, rapid heartbeat Low thyroid levels (hypothyroidism)--unusual weakness or fatigue, increased sensitivity to cold, constipation, hair loss, dry skin, weight gain, feelings of depression Low vitamin B12 level--pain, tingling, or numbness in the hands or feet, muscle weakness, dizziness, confusion, trouble concentrating Pancreatitis--severe stomach pain that spreads to your back or gets worse after eating or when  touched, fever, nausea, vomiting Side effects that usually do not require medical attention (report to your care team if they continue or are bothersome): Diarrhea Dizziness Gas Headache Pain, redness, or irritation at injection site Stomach pain This list may not describe all possible side effects. Call your doctor for medical advice about side effects. You may report side effects to FDA at 1-800-FDA-1088. Where should I keep my medication? Keep out of the reach of children and pets. Store in the refrigerator. Protect from light. Allow to come to room temperature naturally. Do not use artificial heat. If protected from light, the injection may be stored between 20 and 30 degrees C (70 and 86 degrees F) for 14 days. After the initial use, throw away any unused portion of a multiple dose vial after 14 days. Get rid of any unused portions of the ampules after use. To get rid of medications that are no longer needed or have expired: Take the medication to a medication take-back program. Ask your pharmacy or law enforcement to find a location. If you cannot return the medication, ask your pharmacist or care team how to get rid of the medication safely. NOTE: This sheet is a summary. It may not cover all possible information. If you have questions about this medicine, talk to your doctor, pharmacist, or health care provider.  2023 Elsevier/Gold Standard (2021-05-19 00:00:00)  

## 2022-07-19 NOTE — Progress Notes (Signed)
Michael Good   Diagnosis: Carcinoid tumor  INTERVAL HISTORY:   Michael Good returns as scheduled.  He continues everolimus.  He was last treated with Sandostatin 06/08/2022.  No mouth sores.  He has an occasional single episode of diarrhea.  He bruises easily.  No other bleeding.  He continues to have erectile dysfunction.  Oral medications and injection therapy have not helped.  Objective:  Vital signs in last 24 hours:  Blood pressure 137/70, pulse 80, temperature 98.1 F (36.7 C), temperature source Oral, resp. rate 18, height 6\' 1"  (1.854 m), weight 173 lb (78.5 kg), SpO2 100 %.    HEENT: No thrush or ulcers Resp: Lungs clear bilaterally Cardio: Regular rate and rhythm GI: No hepatosplenomegaly, nontender Vascular: No leg edema   Lab Results:  Lab Results  Component Value Date   WBC 2.2 (L) 07/19/2022   HGB 11.7 (L) 07/19/2022   HCT 31.8 (L) 07/19/2022   MCV 81.1 07/19/2022   PLT 70 (L) 07/19/2022   NEUTROABS 1.1 (L) 07/19/2022    CMP  Lab Results  Component Value Date   NA 134 (L) 06/08/2022   K 3.9 06/08/2022   CL 100 06/08/2022   CO2 27 06/08/2022   GLUCOSE 472 (H) 06/08/2022   BUN 16 06/08/2022   CREATININE 1.02 06/08/2022   CALCIUM 9.1 06/08/2022   PROT 6.8 06/08/2022   ALBUMIN 4.3 06/08/2022   AST 31 06/08/2022   ALT 37 06/08/2022   ALKPHOS 141 (H) 06/08/2022   BILITOT 1.1 06/08/2022   GFRNONAA >60 06/08/2022   Medications: I have reviewed the patient's current medications.   Assessment/Plan:  Metastatic carcinoid tumor Left lower lobectomy February 2004 at [facility], 2.2 cm typical carcinoid tumor Laparoscopic cholecystectomy November 2014, imaging revealed a 4.4 cm segment 8 liver lesion 03/18/2013-central hepatectomy and drainage of intra-abdominal abscess,- low-grade neuroendocrine carcinoma, tumor appeared totally excised,Ki-67 less than 5% CT's 05/09/2014, 08/16/2016, 09/19/2017-no evidence of recurrent  disease CTs 914 2020-3 new liver lesions 12/26/2018 PET dotatate scan-at least 20 liver lesions and extensive bone metastases, hypermetabolic pancreas tail lesion 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 01/16/2019-biopsy of the left hepatic lobe lesion-metastatic neuroendocrine carcinoma,Ki-67 10%- interpretation limited due to scant remaining tumor may not be representative of the overall lesion, mismatch repair protein staining intact 02/27/2019-Sandostatin 30 mg every 4 weeks 09/18/2019- Netspot PET-increased activity in the pancreas tail and liver and bone metastases Late August 2021-everolimus, 7.5 mg daily, placed on hold September 2021 due to acute hyperglycemia/new onset diabetes 01/22/2020-everolimus resumed Netspot 09/29/2020-innumerable liver metastases, unchanged, stable tracer uptake in distal pancreas lesion, innumerable bone lesions, significant decrease in tracer uptake in left humeral lesion, mild decrease in FDG uptake in other bone lesions, no new site of metastatic disease Netspot 02/02/2022-no change from scan on 09/29/2020, multifocal hepatic and bone metastases.  Small tail of the pancreas lesion Sandostatin and everolimus continued Diabetes ERCP removal of bile duct stones March 2015, March 2018 Sleep apnea Hypertension COVID-19 infection September 2021 Cholecystectomy November 2014 Umbilical hernia repair 03/18/2013 Erectile dysfunction Chronic thrombocytopenia   Disposition: Michael Good appears stable.  There is no clinical evidence for progression of the carcinoid tumor.  He will continue monthly Sandostatin and everolimus.  The neutrophil count is mildly low today.  He will seek medical attention for a fever or symptoms of an infection.  He will return for a CBC in 2 weeks.  He continues to have erectile dysfunction.  Multiple medical therapies have not helped.  He requested another opinion.  Will make referral to Dr. Darvin Neighbours.  He is concerned about the loss.   His weight has been stable over the past few months.  The weight loss may be related to poorly controlled diabetes.  I recommended he work on blood sugar control.  He will return for Sandostatin every 4 weeks.  He will be scheduled for a 12-week office visit.  Michael Papas, MD  07/19/2022  3:50 PM

## 2022-07-20 ENCOUNTER — Other Ambulatory Visit: Payer: Self-pay | Admitting: *Deleted

## 2022-07-20 DIAGNOSIS — C7A090 Malignant carcinoid tumor of the bronchus and lung: Secondary | ICD-10-CM

## 2022-07-21 ENCOUNTER — Other Ambulatory Visit (HOSPITAL_COMMUNITY): Payer: Self-pay

## 2022-07-21 ENCOUNTER — Encounter: Payer: Self-pay | Admitting: *Deleted

## 2022-07-21 LAB — CHROMOGRANIN A: Chromogranin A (ng/mL): 970.8 ng/mL — ABNORMAL HIGH (ref 0.0–101.8)

## 2022-07-21 NOTE — Progress Notes (Signed)
Faxed referral order, demographics and chart information to Dr. Gaynelle Arabian [phone].

## 2022-07-22 ENCOUNTER — Telehealth: Payer: Self-pay | Admitting: *Deleted

## 2022-07-22 NOTE — Telephone Encounter (Signed)
Sent Mychart message w/chromogranin results since [name] could not be reached by phone.

## 2022-07-22 NOTE — Telephone Encounter (Signed)
-----   Message from Ladene Artist, MD sent at 07/21/2022  7:39 PM EDT ----- Please call patient, the chromogranin a level is lower, follow-up as scheduled

## 2022-07-26 ENCOUNTER — Other Ambulatory Visit (HOSPITAL_COMMUNITY): Payer: Self-pay

## 2022-07-26 ENCOUNTER — Other Ambulatory Visit: Payer: Self-pay

## 2022-07-26 ENCOUNTER — Other Ambulatory Visit: Payer: Self-pay | Admitting: Oncology

## 2022-07-26 DIAGNOSIS — C7A090 Malignant carcinoid tumor of the bronchus and lung: Secondary | ICD-10-CM

## 2022-07-26 MED ORDER — EVEROLIMUS 7.5 MG PO TABS
ORAL_TABLET | Freq: Every day | ORAL | 1 refills | Status: DC
Start: 2022-07-26 — End: 2022-09-19
  Filled 2022-07-26: qty 30, 30d supply, fill #0
  Filled 2022-08-26: qty 30, 30d supply, fill #1

## 2022-07-29 ENCOUNTER — Other Ambulatory Visit (HOSPITAL_COMMUNITY): Payer: Self-pay

## 2022-08-02 ENCOUNTER — Ambulatory Visit: Payer: Medicare Other | Admitting: Nutrition

## 2022-08-02 NOTE — Progress Notes (Signed)
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 Truett Perna.  Weight documented as 173 pounds on May 21.  This is decreased from 177 pounds on February 7.  Patient reports his appetite is very poor.  He usually only eats if he feels hungry.  He tries to drink 1 oral nutrition supplement daily.  Reports he could drink more if it was okay.  Denies other nutrition impact symptoms.  Mentions he would be okay with actually gaining weight.  Nutrition diagnosis: Inadequate oral intake continues.  Intervention: Educated patient to try to eat smaller more frequent meals and snacks throughout the day regardless of hunger. Encouraged 1 oral nutrition supplement up to 3 times daily between meals. Will provide coupons and nutrition fact sheet on poor appetite for patient to pick up when he has labs drawn tomorrow. Encourage patient to contact RD with questions or concerns.  Monitoring, evaluation, goals: Patient will tolerate increased calories and protein to minimize further weight loss.  Next visit: To be scheduled as needed.  **Disclaimer: This note was dictated with voice recognition software. Similar sounding words can inadvertently be transcribed and this note may contain transcription errors which may not have been corrected upon publication of note.**

## 2022-08-03 ENCOUNTER — Inpatient Hospital Stay: Payer: Medicare Other | Attending: Oncology

## 2022-08-03 DIAGNOSIS — C7A090 Malignant carcinoid tumor of the bronchus and lung: Secondary | ICD-10-CM | POA: Insufficient documentation

## 2022-08-03 DIAGNOSIS — C7B02 Secondary carcinoid tumors of liver: Secondary | ICD-10-CM | POA: Insufficient documentation

## 2022-08-03 DIAGNOSIS — C7B03 Secondary carcinoid tumors of bone: Secondary | ICD-10-CM | POA: Insufficient documentation

## 2022-08-03 LAB — CBC WITH DIFFERENTIAL (CANCER CENTER ONLY)
Abs Immature Granulocytes: 0.01 10*3/uL (ref 0.00–0.07)
Basophils Absolute: 0 10*3/uL (ref 0.0–0.1)
Basophils Relative: 0 %
Eosinophils Absolute: 0.1 10*3/uL (ref 0.0–0.5)
Eosinophils Relative: 2 %
HCT: 36 % — ABNORMAL LOW (ref 39.0–52.0)
Hemoglobin: 13.2 g/dL (ref 13.0–17.0)
Immature Granulocytes: 0 %
Lymphocytes Relative: 23 %
Lymphs Abs: 0.7 10*3/uL (ref 0.7–4.0)
MCH: 29.8 pg (ref 26.0–34.0)
MCHC: 36.7 g/dL — ABNORMAL HIGH (ref 30.0–36.0)
MCV: 81.3 fL (ref 80.0–100.0)
Monocytes Absolute: 0.2 10*3/uL (ref 0.1–1.0)
Monocytes Relative: 7 %
Neutro Abs: 2 10*3/uL (ref 1.7–7.7)
Neutrophils Relative %: 68 %
Platelet Count: 79 10*3/uL — ABNORMAL LOW (ref 150–400)
RBC: 4.43 MIL/uL (ref 4.22–5.81)
RDW: 13.2 % (ref 11.5–15.5)
WBC Count: 3 10*3/uL — ABNORMAL LOW (ref 4.0–10.5)
nRBC: 0 % (ref 0.0–0.2)

## 2022-08-04 NOTE — Telephone Encounter (Signed)
-----   Message from Ladene Artist, MD sent at 08/03/2022  4:06 PM EDT ----- Please call patient, the neutrophil count is higher, continue everolimus, follow-up as scheduled

## 2022-08-17 ENCOUNTER — Encounter: Payer: Self-pay | Admitting: *Deleted

## 2022-08-23 ENCOUNTER — Other Ambulatory Visit (HOSPITAL_COMMUNITY): Payer: Self-pay

## 2022-08-26 ENCOUNTER — Other Ambulatory Visit: Payer: Self-pay

## 2022-08-26 ENCOUNTER — Other Ambulatory Visit (HOSPITAL_COMMUNITY): Payer: Self-pay

## 2022-08-29 ENCOUNTER — Other Ambulatory Visit: Payer: Self-pay

## 2022-08-29 ENCOUNTER — Other Ambulatory Visit (HOSPITAL_COMMUNITY): Payer: Self-pay

## 2022-09-13 ENCOUNTER — Inpatient Hospital Stay: Payer: Medicare Other

## 2022-09-14 ENCOUNTER — Inpatient Hospital Stay: Payer: Medicare Other

## 2022-09-15 ENCOUNTER — Other Ambulatory Visit (HOSPITAL_COMMUNITY): Payer: Self-pay

## 2022-09-19 ENCOUNTER — Other Ambulatory Visit (HOSPITAL_COMMUNITY): Payer: Self-pay

## 2022-09-19 ENCOUNTER — Other Ambulatory Visit: Payer: Self-pay | Admitting: Oncology

## 2022-09-19 ENCOUNTER — Other Ambulatory Visit: Payer: Self-pay

## 2022-09-19 DIAGNOSIS — C7A090 Malignant carcinoid tumor of the bronchus and lung: Secondary | ICD-10-CM

## 2022-09-19 MED ORDER — EVEROLIMUS 7.5 MG PO TABS
ORAL_TABLET | Freq: Every day | ORAL | 1 refills | Status: DC
Start: 2022-09-19 — End: 2022-11-15
  Filled 2022-09-19: qty 30, 30d supply, fill #0
  Filled 2022-10-24: qty 30, 30d supply, fill #1

## 2022-09-21 ENCOUNTER — Inpatient Hospital Stay: Payer: Medicare Other | Attending: Oncology

## 2022-09-21 ENCOUNTER — Encounter: Payer: Self-pay | Admitting: *Deleted

## 2022-09-21 ENCOUNTER — Other Ambulatory Visit (HOSPITAL_COMMUNITY): Payer: Self-pay

## 2022-09-21 ENCOUNTER — Inpatient Hospital Stay: Payer: Medicare Other

## 2022-09-21 VITALS — BP 131/69 | HR 69 | Temp 97.9°F | Resp 18

## 2022-09-21 DIAGNOSIS — C7B03 Secondary carcinoid tumors of bone: Secondary | ICD-10-CM | POA: Insufficient documentation

## 2022-09-21 DIAGNOSIS — C7A090 Malignant carcinoid tumor of the bronchus and lung: Secondary | ICD-10-CM | POA: Diagnosis present

## 2022-09-21 DIAGNOSIS — C7B02 Secondary carcinoid tumors of liver: Secondary | ICD-10-CM | POA: Insufficient documentation

## 2022-09-21 LAB — CMP (CANCER CENTER ONLY)
ALT: 19 U/L (ref 0–44)
AST: 19 U/L (ref 15–41)
Albumin: 4.1 g/dL (ref 3.5–5.0)
Alkaline Phosphatase: 104 U/L (ref 38–126)
Anion gap: 7 (ref 5–15)
BUN: 17 mg/dL (ref 8–23)
CO2: 26 mmol/L (ref 22–32)
Calcium: 8.9 mg/dL (ref 8.9–10.3)
Chloride: 101 mmol/L (ref 98–111)
Creatinine: 1.04 mg/dL (ref 0.61–1.24)
GFR, Estimated: >60 mL/min (ref >60)
Glucose, Bld: 470 mg/dL — ABNORMAL HIGH (ref 70–99)
Potassium: 3.9 mmol/L (ref 3.5–5.1)
Sodium: 134 mmol/L — ABNORMAL LOW (ref 135–145)
Total Bilirubin: 1.1 mg/dL (ref 0.3–1.2)
Total Protein: 6.8 g/dL (ref 6.5–8.1)

## 2022-09-21 LAB — CBC WITH DIFFERENTIAL (CANCER CENTER ONLY)
Abs Immature Granulocytes: 0.01 10*3/uL (ref 0.00–0.07)
Basophils Absolute: 0 10*3/uL (ref 0.0–0.1)
Basophils Relative: 0 %
Eosinophils Absolute: 0.1 10*3/uL (ref 0.0–0.5)
Eosinophils Relative: 2 %
HCT: 31.9 % — ABNORMAL LOW (ref 39.0–52.0)
Hemoglobin: 11.9 g/dL — ABNORMAL LOW (ref 13.0–17.0)
Immature Granulocytes: 0 %
Lymphocytes Relative: 26 %
Lymphs Abs: 0.8 10*3/uL (ref 0.7–4.0)
MCH: 30.7 pg (ref 26.0–34.0)
MCHC: 37.3 g/dL — ABNORMAL HIGH (ref 30.0–36.0)
MCV: 82.2 fL (ref 80.0–100.0)
Monocytes Absolute: 0.2 10*3/uL (ref 0.1–1.0)
Monocytes Relative: 8 %
Neutro Abs: 1.9 10*3/uL (ref 1.7–7.7)
Neutrophils Relative %: 64 %
Platelet Count: 76 10*3/uL — ABNORMAL LOW (ref 150–400)
RBC: 3.88 MIL/uL — ABNORMAL LOW (ref 4.22–5.81)
RDW: 13.3 % (ref 11.5–15.5)
WBC Count: 3 10*3/uL — ABNORMAL LOW (ref 4.0–10.5)
nRBC: 0 % (ref 0.0–0.2)

## 2022-09-21 MED ORDER — OCTREOTIDE ACETATE 30 MG IM KIT
30.0000 mg | PACK | Freq: Once | INTRAMUSCULAR | Status: AC
  Administered 2022-09-21: 30 mg via INTRAMUSCULAR
  Filled 2022-09-21: qty 1

## 2022-09-21 NOTE — Patient Instructions (Signed)
Octreotide Injection Solution What is this medication? OCTREOTIDE (ok TREE oh tide) treats high levels of growth hormone (acromegaly). It works by reducing the amount of growth hormone your body makes. This reduces symptoms and the risk of health problems caused by too much growth hormone, such as diabetes and heart disease. It may also be used to treat diarrhea caused by neuroendocrine tumors. It works by slowing down the release of serotonin from the tumor cells. This reduces the number of bowel movements you have. This medicine may be used for other purposes; ask your health care provider or pharmacist if you have questions. COMMON BRAND NAME(S): Bynfezia, Sandostatin What should I tell my care team before I take this medication? They need to know if you have any of these conditions: Diabetes Gallbladder disease Kidney disease Liver disease Thyroid disease An unusual or allergic reaction to octreotide, other medications, foods, dyes, or preservatives Pregnant or trying to get pregnant Breast-feeding How should I use this medication? This medication is injected under the skin or into a vein. It is usually given by your care team in a hospital or clinic setting. If you get this medication at home, you will be taught how to prepare and give it. Use exactly as directed. Take it as directed on the prescription label at the same time every day. Keep taking it unless your care team tells you to stop. Allow the injection solution to come to room temperature before use. Do not warm it artificially. It is important that you put your used needles and syringes in a special sharps container. Do not put them in a trash can. If you do not have a sharps container, call your pharmacist or care team to get one. Talk to your care team about the use of this medication in children. Special care may be needed. Overdosage: If you think you have taken too much of this medicine contact a poison control center or  emergency room at once. NOTE: This medicine is only for you. Do not share this medicine with others. What if I miss a dose? If you miss a dose, take it as soon as you can. If it is almost time for your next dose, take only that dose. Do not take double or extra doses. What may interact with this medication? Bromocriptine Certain medications for blood pressure, heart disease, irregular heartbeat Cyclosporine Diuretics Medications for diabetes, including insulin Quinidine This list may not describe all possible interactions. Give your health care provider a list of all the medicines, herbs, non-prescription drugs, or dietary supplements you use. Also tell them if you smoke, drink alcohol, or use illegal drugs. Some items may interact with your medicine. What should I watch for while using this medication? Visit your care team for regular checks on your progress. Tell your care team if your symptoms do not start to get better or if they get worse. To help reduce irritation at the injection site, use a different site for each injection and make sure the solution is at room temperature before use. This medication may cause decreases in blood sugar. Signs of low blood sugar include chills, cool, pale skin or cold sweats, drowsiness, extreme hunger, fast heartbeat, headache, nausea, nervousness or anxiety, shakiness, trembling, unsteadiness, tiredness, or weakness. Contact your care team right away if you experience any of these symptoms. This medication may increase blood sugar. The risk may be higher in patients who already have diabetes. Ask your care team what you can do to lower your   risk of diabetes while taking this medication. You should make sure you get enough vitamin B12 while you are taking this medication. Discuss the foods you eat and the vitamins you take with your care team. What side effects may I notice from receiving this medication? Side effects that you should report to your care  team as soon as possible: Allergic reactions--skin rash, itching, hives, swelling of the face, lips, tongue, or throat Gallbladder problems--severe stomach pain, nausea, vomiting, fever Heart rhythm changes--fast or irregular heartbeat, dizziness, feeling faint or lightheaded, chest pain, trouble breathing High blood sugar (hyperglycemia)--increased thirst or amount of urine, unusual weakness or fatigue, blurry vision Low blood sugar (hypoglycemia)--tremors or shaking, anxiety, sweating, cold or clammy skin, confusion, dizziness, rapid heartbeat Low thyroid levels (hypothyroidism)--unusual weakness or fatigue, increased sensitivity to cold, constipation, hair loss, dry skin, weight gain, feelings of depression Low vitamin B12 level--pain, tingling, or numbness in the hands or feet, muscle weakness, dizziness, confusion, trouble concentrating Pancreatitis--severe stomach pain that spreads to your back or gets worse after eating or when touched, fever, nausea, vomiting Side effects that usually do not require medical attention (report to your care team if they continue or are bothersome): Diarrhea Dizziness Gas Headache Pain, redness, or irritation at injection site Stomach pain This list may not describe all possible side effects. Call your doctor for medical advice about side effects. You may report side effects to FDA at 1-800-FDA-1088. Where should I keep my medication? Keep out of the reach of children and pets. Store in the refrigerator. Protect from light. Allow to come to room temperature naturally. Do not use artificial heat. If protected from light, the injection may be stored between 20 and 30 degrees C (70 and 86 degrees F) for 14 days. After the initial use, throw away any unused portion of a multiple dose vial after 14 days. Get rid of any unused portions of the ampules after use. To get rid of medications that are no longer needed or have expired: Take the medication to a medication  take-back program. Ask your pharmacy or law enforcement to find a location. If you cannot return the medication, ask your pharmacist or care team how to get rid of the medication safely. NOTE: This sheet is a summary. It may not cover all possible information. If you have questions about this medicine, talk to your doctor, pharmacist, or health care provider.  2024 Elsevier/Gold Standard (2021-05-20 00:00:00)  

## 2022-09-22 LAB — CHROMOGRANIN A: Chromogranin A (ng/mL): 1696 ng/mL — ABNORMAL HIGH (ref 0.0–101.8)

## 2022-10-11 ENCOUNTER — Inpatient Hospital Stay: Payer: Medicare Other

## 2022-10-11 ENCOUNTER — Inpatient Hospital Stay: Payer: Medicare Other | Admitting: Oncology

## 2022-10-17 ENCOUNTER — Inpatient Hospital Stay (HOSPITAL_BASED_OUTPATIENT_CLINIC_OR_DEPARTMENT_OTHER): Payer: Medicare Other | Admitting: Nurse Practitioner

## 2022-10-17 ENCOUNTER — Encounter: Payer: Self-pay | Admitting: Nurse Practitioner

## 2022-10-17 ENCOUNTER — Inpatient Hospital Stay: Payer: Medicare Other

## 2022-10-17 ENCOUNTER — Inpatient Hospital Stay: Payer: Medicare Other | Attending: Oncology

## 2022-10-17 VITALS — BP 140/72 | HR 76 | Temp 98.1°F | Resp 18 | Ht 73.0 in | Wt 171.5 lb

## 2022-10-17 DIAGNOSIS — C7B03 Secondary carcinoid tumors of bone: Secondary | ICD-10-CM | POA: Diagnosis present

## 2022-10-17 DIAGNOSIS — C7A090 Malignant carcinoid tumor of the bronchus and lung: Secondary | ICD-10-CM | POA: Diagnosis not present

## 2022-10-17 DIAGNOSIS — C7B8 Other secondary neuroendocrine tumors: Secondary | ICD-10-CM | POA: Diagnosis not present

## 2022-10-17 DIAGNOSIS — D696 Thrombocytopenia, unspecified: Secondary | ICD-10-CM | POA: Diagnosis not present

## 2022-10-17 DIAGNOSIS — C7B02 Secondary carcinoid tumors of liver: Secondary | ICD-10-CM | POA: Insufficient documentation

## 2022-10-17 DIAGNOSIS — N529 Male erectile dysfunction, unspecified: Secondary | ICD-10-CM | POA: Insufficient documentation

## 2022-10-17 DIAGNOSIS — G4730 Sleep apnea, unspecified: Secondary | ICD-10-CM | POA: Insufficient documentation

## 2022-10-17 DIAGNOSIS — E119 Type 2 diabetes mellitus without complications: Secondary | ICD-10-CM | POA: Diagnosis not present

## 2022-10-17 DIAGNOSIS — I10 Essential (primary) hypertension: Secondary | ICD-10-CM | POA: Diagnosis not present

## 2022-10-17 LAB — CBC WITH DIFFERENTIAL (CANCER CENTER ONLY)
Abs Immature Granulocytes: 0.01 10*3/uL (ref 0.00–0.07)
Basophils Absolute: 0 10*3/uL (ref 0.0–0.1)
Basophils Relative: 0 %
Eosinophils Absolute: 0.1 10*3/uL (ref 0.0–0.5)
Eosinophils Relative: 2 %
HCT: 34.7 % — ABNORMAL LOW (ref 39.0–52.0)
Hemoglobin: 13 g/dL (ref 13.0–17.0)
Immature Granulocytes: 0 %
Lymphocytes Relative: 20 %
Lymphs Abs: 0.7 10*3/uL (ref 0.7–4.0)
MCH: 30.8 pg (ref 26.0–34.0)
MCHC: 37.5 g/dL — ABNORMAL HIGH (ref 30.0–36.0)
MCV: 82.2 fL (ref 80.0–100.0)
Monocytes Absolute: 0.2 10*3/uL (ref 0.1–1.0)
Monocytes Relative: 6 %
Neutro Abs: 2.6 10*3/uL (ref 1.7–7.7)
Neutrophils Relative %: 72 %
Platelet Count: 93 10*3/uL — ABNORMAL LOW (ref 150–400)
RBC: 4.22 MIL/uL (ref 4.22–5.81)
RDW: 13 % (ref 11.5–15.5)
WBC Count: 3.6 10*3/uL — ABNORMAL LOW (ref 4.0–10.5)
nRBC: 0 % (ref 0.0–0.2)

## 2022-10-17 MED ORDER — OCTREOTIDE ACETATE 30 MG IM KIT
30.0000 mg | PACK | Freq: Once | INTRAMUSCULAR | Status: AC
  Administered 2022-10-17: 30 mg via INTRAMUSCULAR
  Filled 2022-10-17: qty 1

## 2022-10-17 NOTE — Progress Notes (Signed)
  [facility] OFFICE PROGRESS NOTE   Diagnosis: Carcinoid tumor  INTERVAL HISTORY:   Michael Good returns for follow-up.  He continues monthly Sandostatin.  He continues everolimus.  He has occasional mild nausea.  Occasional loose stool.  No flushing episodes.  He continues to note easy bruising over the forearms.  Bruises are at sites of minor trauma.  Objective:  Vital signs in last 24 hours:  Blood pressure (!) 140/72, pulse 76, temperature 98.1 F (36.7 C), temperature source Oral, resp. rate 18, height 6\' 1"  (1.854 m), weight 171 lb 8 oz (77.8 kg), SpO2 100%.    HEENT: No thrush or ulcers. Resp: Lungs clear bilaterally. Cardio: Regular rate and rhythm. GI: No hepatosplenomegaly.  No mass. Vascular: No leg edema. Skin: A few scattered abrasions and ecchymoses on the forearms and lower legs.   Lab Results:  Lab Results  Component Value Date   WBC 3.0 (L) 09/21/2022   HGB 11.9 (L) 09/21/2022   HCT 31.9 (L) 09/21/2022   MCV 82.2 09/21/2022   PLT 76 (L) 09/21/2022   NEUTROABS 1.9 09/21/2022    Imaging:  No results found.  Medications: I have reviewed the patient's current medications.  Assessment/Plan: Metastatic carcinoid tumor Left lower lobectomy February 2004 at [facility], 2.2 cm typical carcinoid tumor Laparoscopic cholecystectomy November 2014, imaging revealed a 4.4 cm segment 8 liver lesion 03/18/2013-central hepatectomy and drainage of intra-abdominal abscess,- low-grade neuroendocrine carcinoma, tumor appeared totally excised,Ki-67 less than 5% CT's 05/09/2014, 08/16/2016, 09/19/2017-no evidence of recurrent disease CTs 914 2020-3 new liver lesions 12/26/2018 PET dotatate scan-at least 20 liver lesions and extensive bone metastases, hypermetabolic pancreas tail lesion 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 01/16/2019-biopsy of the left hepatic lobe lesion-metastatic neuroendocrine carcinoma,Ki-67 10%- interpretation limited due to scant  remaining tumor may not be representative of the overall lesion, mismatch repair protein staining intact 02/27/2019-Sandostatin 30 mg every 4 weeks 09/18/2019- Netspot PET-increased activity in the pancreas tail and liver and bone metastases Late August 2021-everolimus, 7.5 mg daily, placed on hold September 2021 due to acute hyperglycemia/new onset diabetes 01/22/2020-everolimus resumed Netspot 09/29/2020-innumerable liver metastases, unchanged, stable tracer uptake in distal pancreas lesion, innumerable bone lesions, significant decrease in tracer uptake in left humeral lesion, mild decrease in FDG uptake in other bone lesions, no new site of metastatic disease Netspot 02/02/2022-no change from scan on 09/29/2020, multifocal hepatic and bone metastases.  Small tail of the pancreas lesion Sandostatin and everolimus continued Diabetes ERCP removal of bile duct stones March 2015, March 2018 Sleep apnea Hypertension COVID-19 infection September 2021 Cholecystectomy November 2014 Umbilical hernia repair 03/18/2013 Erectile dysfunction Chronic thrombocytopenia  Disposition: Michael Good appears stable.  There is no clinical evidence of disease progression.  He will continue Sandostatin and everolimus.  We discussed the easy bruising is likely related to mild thrombocytopenia, minor trauma.  He will contact the office with spontaneous bruising/bleeding.  He will return for Sandostatin every 4 weeks.  We will see him in follow-up in 3 months.  Michael Good ANP/GNP-BC   10/17/2022  10:04 AM

## 2022-10-17 NOTE — Patient Instructions (Signed)
Octreotide Injection Solution What is this medication? OCTREOTIDE (ok TREE oh tide) treats high levels of growth hormone (acromegaly). It works by reducing the amount of growth hormone your body makes. This reduces symptoms and the risk of health problems caused by too much growth hormone, such as diabetes and heart disease. It may also be used to treat diarrhea caused by neuroendocrine tumors. It works by slowing down the release of serotonin from the tumor cells. This reduces the number of bowel movements you have. This medicine may be used for other purposes; ask your health care provider or pharmacist if you have questions. COMMON BRAND NAME(S): Bynfezia, Sandostatin What should I tell my care team before I take this medication? They need to know if you have any of these conditions: Diabetes Gallbladder disease Kidney disease Liver disease Thyroid disease An unusual or allergic reaction to octreotide, other medications, foods, dyes, or preservatives Pregnant or trying to get pregnant Breast-feeding How should I use this medication? This medication is injected under the skin or into a vein. It is usually given by your care team in a hospital or clinic setting. If you get this medication at home, you will be taught how to prepare and give it. Use exactly as directed. Take it as directed on the prescription label at the same time every day. Keep taking it unless your care team tells you to stop. Allow the injection solution to come to room temperature before use. Do not warm it artificially. It is important that you put your used needles and syringes in a special sharps container. Do not put them in a trash can. If you do not have a sharps container, call your pharmacist or care team to get one. Talk to your care team about the use of this medication in children. Special care may be needed. Overdosage: If you think you have taken too much of this medicine contact a poison control center or  emergency room at once. NOTE: This medicine is only for you. Do not share this medicine with others. What if I miss a dose? If you miss a dose, take it as soon as you can. If it is almost time for your next dose, take only that dose. Do not take double or extra doses. What may interact with this medication? Bromocriptine Certain medications for blood pressure, heart disease, irregular heartbeat Cyclosporine Diuretics Medications for diabetes, including insulin Quinidine This list may not describe all possible interactions. Give your health care provider a list of all the medicines, herbs, non-prescription drugs, or dietary supplements you use. Also tell them if you smoke, drink alcohol, or use illegal drugs. Some items may interact with your medicine. What should I watch for while using this medication? Visit your care team for regular checks on your progress. Tell your care team if your symptoms do not start to get better or if they get worse. To help reduce irritation at the injection site, use a different site for each injection and make sure the solution is at room temperature before use. This medication may cause decreases in blood sugar. Signs of low blood sugar include chills, cool, pale skin or cold sweats, drowsiness, extreme hunger, fast heartbeat, headache, nausea, nervousness or anxiety, shakiness, trembling, unsteadiness, tiredness, or weakness. Contact your care team right away if you experience any of these symptoms. This medication may increase blood sugar. The risk may be higher in patients who already have diabetes. Ask your care team what you can do to lower your   risk of diabetes while taking this medication. You should make sure you get enough vitamin B12 while you are taking this medication. Discuss the foods you eat and the vitamins you take with your care team. What side effects may I notice from receiving this medication? Side effects that you should report to your care  team as soon as possible: Allergic reactions--skin rash, itching, hives, swelling of the face, lips, tongue, or throat Gallbladder problems--severe stomach pain, nausea, vomiting, fever Heart rhythm changes--fast or irregular heartbeat, dizziness, feeling faint or lightheaded, chest pain, trouble breathing High blood sugar (hyperglycemia)--increased thirst or amount of urine, unusual weakness or fatigue, blurry vision Low blood sugar (hypoglycemia)--tremors or shaking, anxiety, sweating, cold or clammy skin, confusion, dizziness, rapid heartbeat Low thyroid levels (hypothyroidism)--unusual weakness or fatigue, increased sensitivity to cold, constipation, hair loss, dry skin, weight gain, feelings of depression Low vitamin B12 level--pain, tingling, or numbness in the hands or feet, muscle weakness, dizziness, confusion, trouble concentrating Pancreatitis--severe stomach pain that spreads to your back or gets worse after eating or when touched, fever, nausea, vomiting Side effects that usually do not require medical attention (report to your care team if they continue or are bothersome): Diarrhea Dizziness Gas Headache Pain, redness, or irritation at injection site Stomach pain This list may not describe all possible side effects. Call your doctor for medical advice about side effects. You may report side effects to FDA at 1-800-FDA-1088. Where should I keep my medication? Keep out of the reach of children and pets. Store in the refrigerator. Protect from light. Allow to come to room temperature naturally. Do not use artificial heat. If protected from light, the injection may be stored between 20 and 30 degrees C (70 and 86 degrees F) for 14 days. After the initial use, throw away any unused portion of a multiple dose vial after 14 days. Get rid of any unused portions of the ampules after use. To get rid of medications that are no longer needed or have expired: Take the medication to a medication  take-back program. Ask your pharmacy or law enforcement to find a location. If you cannot return the medication, ask your pharmacist or care team how to get rid of the medication safely. NOTE: This sheet is a summary. It may not cover all possible information. If you have questions about this medicine, talk to your doctor, pharmacist, or health care provider.  2024 Elsevier/Gold Standard (2021-05-20 00:00:00)  

## 2022-10-19 ENCOUNTER — Other Ambulatory Visit: Payer: Medicare Other

## 2022-10-19 ENCOUNTER — Ambulatory Visit: Payer: Medicare Other | Admitting: Oncology

## 2022-10-19 ENCOUNTER — Other Ambulatory Visit: Payer: Self-pay

## 2022-10-19 ENCOUNTER — Ambulatory Visit: Payer: Medicare Other

## 2022-10-21 ENCOUNTER — Other Ambulatory Visit: Payer: Self-pay

## 2022-10-24 ENCOUNTER — Other Ambulatory Visit (HOSPITAL_COMMUNITY): Payer: Self-pay

## 2022-10-25 ENCOUNTER — Other Ambulatory Visit (HOSPITAL_COMMUNITY): Payer: Self-pay

## 2022-10-25 ENCOUNTER — Other Ambulatory Visit: Payer: Self-pay

## 2022-11-15 ENCOUNTER — Other Ambulatory Visit (HOSPITAL_COMMUNITY): Payer: Self-pay

## 2022-11-15 ENCOUNTER — Other Ambulatory Visit: Payer: Self-pay | Admitting: Oncology

## 2022-11-15 DIAGNOSIS — C7A090 Malignant carcinoid tumor of the bronchus and lung: Secondary | ICD-10-CM

## 2022-11-15 MED ORDER — EVEROLIMUS 7.5 MG PO TABS
ORAL_TABLET | Freq: Every day | ORAL | 1 refills | Status: DC
Start: 2022-11-15 — End: 2023-01-19
  Filled 2022-11-15: qty 30, 30d supply, fill #0
  Filled 2022-12-21: qty 30, 30d supply, fill #1

## 2022-11-16 ENCOUNTER — Ambulatory Visit: Payer: Medicare Other

## 2022-11-16 ENCOUNTER — Inpatient Hospital Stay: Payer: Medicare Other | Attending: Oncology

## 2022-11-16 ENCOUNTER — Other Ambulatory Visit: Payer: Medicare Other

## 2022-11-16 ENCOUNTER — Inpatient Hospital Stay: Payer: Medicare Other

## 2022-11-16 VITALS — BP 149/79 | HR 67 | Temp 98.1°F | Resp 18 | Ht 73.0 in | Wt 172.8 lb

## 2022-11-16 DIAGNOSIS — C7A090 Malignant carcinoid tumor of the bronchus and lung: Secondary | ICD-10-CM

## 2022-11-16 DIAGNOSIS — C7B02 Secondary carcinoid tumors of liver: Secondary | ICD-10-CM | POA: Insufficient documentation

## 2022-11-16 DIAGNOSIS — C7B03 Secondary carcinoid tumors of bone: Secondary | ICD-10-CM | POA: Insufficient documentation

## 2022-11-16 DIAGNOSIS — C7B8 Other secondary neuroendocrine tumors: Secondary | ICD-10-CM

## 2022-11-16 LAB — CBC WITH DIFFERENTIAL (CANCER CENTER ONLY)
Abs Immature Granulocytes: 0 10*3/uL (ref 0.00–0.07)
Basophils Absolute: 0 10*3/uL (ref 0.0–0.1)
Basophils Relative: 0 %
Eosinophils Absolute: 0.1 10*3/uL (ref 0.0–0.5)
Eosinophils Relative: 2 %
HCT: 34.8 % — ABNORMAL LOW (ref 39.0–52.0)
Hemoglobin: 12.5 g/dL — ABNORMAL LOW (ref 13.0–17.0)
Immature Granulocytes: 0 %
Lymphocytes Relative: 28 %
Lymphs Abs: 0.6 10*3/uL — ABNORMAL LOW (ref 0.7–4.0)
MCH: 30.3 pg (ref 26.0–34.0)
MCHC: 35.9 g/dL (ref 30.0–36.0)
MCV: 84.5 fL (ref 80.0–100.0)
Monocytes Absolute: 0.2 10*3/uL (ref 0.1–1.0)
Monocytes Relative: 9 %
Neutro Abs: 1.4 10*3/uL — ABNORMAL LOW (ref 1.7–7.7)
Neutrophils Relative %: 61 %
Platelet Count: 76 10*3/uL — ABNORMAL LOW (ref 150–400)
RBC: 4.12 MIL/uL — ABNORMAL LOW (ref 4.22–5.81)
RDW: 12.9 % (ref 11.5–15.5)
WBC Count: 2.3 10*3/uL — ABNORMAL LOW (ref 4.0–10.5)
nRBC: 0 % (ref 0.0–0.2)

## 2022-11-16 MED ORDER — OCTREOTIDE ACETATE 30 MG IM KIT
30.0000 mg | PACK | Freq: Once | INTRAMUSCULAR | Status: AC
  Administered 2022-11-16: 30 mg via INTRAMUSCULAR
  Filled 2022-11-16: qty 1

## 2022-11-16 NOTE — Patient Instructions (Signed)
Octreotide Injection Solution What is this medication? OCTREOTIDE (ok TREE oh tide) treats high levels of growth hormone (acromegaly). It works by reducing the amount of growth hormone your body makes. This reduces symptoms and the risk of health problems caused by too much growth hormone, such as diabetes and heart disease. It may also be used to treat diarrhea caused by neuroendocrine tumors. It works by slowing down the release of serotonin from the tumor cells. This reduces the number of bowel movements you have. This medicine may be used for other purposes; ask your health care provider or pharmacist if you have questions. COMMON BRAND NAME(S): Berline Lopes, Sandostatin What should I tell my care team before I take this medication? They need to know if you have any of these conditions: Diabetes Gallbladder disease Kidney disease Liver disease Thyroid disease An unusual or allergic reaction to octreotide, other medications, foods, dyes, or preservatives Pregnant or trying to get pregnant Breast-feeding How should I use this medication? This medication is injected under the skin or into a vein. It is usually given by your care team in a hospital or clinic setting. If you get this medication at home, you will be taught how to prepare and give it. Use exactly as directed. Take it as directed on the prescription label at the same time every day. Keep taking it unless your care team tells you to stop. Allow the injection solution to come to room temperature before use. Do not warm it artificially. It is important that you put your used needles and syringes in a special sharps container. Do not put them in a trash can. If you do not have a sharps container, call your pharmacist or care team to get one. Talk to your care team about the use of this medication in children. Special care may be needed. Overdosage: If you think you have taken too much of this medicine contact a poison control center or  emergency room at once. NOTE: This medicine is only for you. Do not share this medicine with others. What if I miss a dose? If you miss a dose, take it as soon as you can. If it is almost time for your next dose, take only that dose. Do not take double or extra doses. What may interact with this medication? Bromocriptine Certain medications for blood pressure, heart disease, irregular heartbeat Cyclosporine Diuretics Medications for diabetes, including insulin Quinidine This list may not describe all possible interactions. Give your health care provider a list of all the medicines, herbs, non-prescription drugs, or dietary supplements you use. Also tell them if you smoke, drink alcohol, or use illegal drugs. Some items may interact with your medicine. What should I watch for while using this medication? Visit your care team for regular checks on your progress. Tell your care team if your symptoms do not start to get better or if they get worse. To help reduce irritation at the injection site, use a different site for each injection and make sure the solution is at room temperature before use. This medication may cause decreases in blood sugar. Signs of low blood sugar include chills, cool, pale skin or cold sweats, drowsiness, extreme hunger, fast heartbeat, headache, nausea, nervousness or anxiety, shakiness, trembling, unsteadiness, tiredness, or weakness. Contact your care team right away if you experience any of these symptoms. This medication may increase blood sugar. The risk may be higher in patients who already have diabetes. Ask your care team what you can do to lower your  risk of diabetes while taking this medication. You should make sure you get enough vitamin B12 while you are taking this medication. Discuss the foods you eat and the vitamins you take with your care team. What side effects may I notice from receiving this medication? Side effects that you should report to your care  team as soon as possible: Allergic reactions--skin rash, itching, hives, swelling of the face, lips, tongue, or throat Gallbladder problems--severe stomach pain, nausea, vomiting, fever Heart rhythm changes--fast or irregular heartbeat, dizziness, feeling faint or lightheaded, chest pain, trouble breathing High blood sugar (hyperglycemia)--increased thirst or amount of urine, unusual weakness or fatigue, blurry vision Low blood sugar (hypoglycemia)--tremors or shaking, anxiety, sweating, cold or clammy skin, confusion, dizziness, rapid heartbeat Low thyroid levels (hypothyroidism)--unusual weakness or fatigue, increased sensitivity to cold, constipation, hair loss, dry skin, weight gain, feelings of depression Low vitamin B12 level--pain, tingling, or numbness in the hands or feet, muscle weakness, dizziness, confusion, trouble concentrating Pancreatitis--severe stomach pain that spreads to your back or gets worse after eating or when touched, fever, nausea, vomiting Side effects that usually do not require medical attention (report to your care team if they continue or are bothersome): Diarrhea Dizziness Gas Headache Pain, redness, or irritation at injection site Stomach pain This list may not describe all possible side effects. Call your doctor for medical advice about side effects. You may report side effects to FDA at 1-800-FDA-1088. Where should I keep my medication? Keep out of the reach of children and pets. Store in the refrigerator. Protect from light. Allow to come to room temperature naturally. Do not use artificial heat. If protected from light, the injection may be stored between 20 and 30 degrees C (70 and 86 degrees F) for 14 days. After the initial use, throw away any unused portion of a multiple dose vial after 14 days. Get rid of any unused portions of the ampules after use. To get rid of medications that are no longer needed or have expired: Take the medication to a medication  take-back program. Ask your pharmacy or law enforcement to find a location. If you cannot return the medication, ask your pharmacist or care team how to get rid of the medication safely. NOTE: This sheet is a summary. It may not cover all possible information. If you have questions about this medicine, talk to your doctor, pharmacist, or health care provider.  2024 Elsevier/Gold Standard (2021-05-20 00:00:00)

## 2022-11-18 ENCOUNTER — Telehealth: Payer: Self-pay

## 2022-11-18 NOTE — Telephone Encounter (Signed)
I attempted to contact the patient but did not receive a response. I have left a message and sent a communication through MyChart.

## 2022-11-18 NOTE — Telephone Encounter (Signed)
-----   Message from Lonna Cobb sent at 11/16/2022  3:48 PM EDT ----- Please let him know blood counts are stable, white count and platelets remain low.  Call with fever, other signs of infection, bleeding.  Follow-up as scheduled.

## 2022-11-18 NOTE — Telephone Encounter (Signed)
Lab result

## 2022-11-21 ENCOUNTER — Other Ambulatory Visit: Payer: Self-pay

## 2022-11-21 ENCOUNTER — Other Ambulatory Visit (HOSPITAL_COMMUNITY): Payer: Self-pay

## 2022-11-21 NOTE — Progress Notes (Signed)
Check [facility] for refill assessment ship 9/24

## 2022-11-22 ENCOUNTER — Other Ambulatory Visit: Payer: Self-pay

## 2022-12-08 ENCOUNTER — Other Ambulatory Visit (HOSPITAL_COMMUNITY): Payer: Self-pay

## 2022-12-14 ENCOUNTER — Inpatient Hospital Stay: Payer: Medicare Other

## 2022-12-14 ENCOUNTER — Inpatient Hospital Stay: Payer: Medicare Other | Attending: Oncology

## 2022-12-14 ENCOUNTER — Telehealth: Payer: Self-pay

## 2022-12-14 ENCOUNTER — Other Ambulatory Visit: Payer: Medicare Other

## 2022-12-14 ENCOUNTER — Ambulatory Visit: Payer: Medicare Other

## 2022-12-14 VITALS — BP 162/80 | HR 72 | Temp 98.0°F | Resp 18

## 2022-12-14 DIAGNOSIS — Z23 Encounter for immunization: Secondary | ICD-10-CM | POA: Insufficient documentation

## 2022-12-14 DIAGNOSIS — C7B8 Other secondary neuroendocrine tumors: Secondary | ICD-10-CM

## 2022-12-14 DIAGNOSIS — C7B02 Secondary carcinoid tumors of liver: Secondary | ICD-10-CM | POA: Diagnosis present

## 2022-12-14 DIAGNOSIS — C7A090 Malignant carcinoid tumor of the bronchus and lung: Secondary | ICD-10-CM

## 2022-12-14 DIAGNOSIS — C7B03 Secondary carcinoid tumors of bone: Secondary | ICD-10-CM | POA: Diagnosis present

## 2022-12-14 LAB — CBC WITH DIFFERENTIAL (CANCER CENTER ONLY)
Abs Immature Granulocytes: 0.01 10*3/uL (ref 0.00–0.07)
Basophils Absolute: 0 10*3/uL (ref 0.0–0.1)
Basophils Relative: 0 %
Eosinophils Absolute: 0.1 10*3/uL (ref 0.0–0.5)
Eosinophils Relative: 2 %
HCT: 33.3 % — ABNORMAL LOW (ref 39.0–52.0)
Hemoglobin: 12.3 g/dL — ABNORMAL LOW (ref 13.0–17.0)
Immature Granulocytes: 0 %
Lymphocytes Relative: 20 %
Lymphs Abs: 0.6 10*3/uL — ABNORMAL LOW (ref 0.7–4.0)
MCH: 30 pg (ref 26.0–34.0)
MCHC: 36.9 g/dL — ABNORMAL HIGH (ref 30.0–36.0)
MCV: 81.2 fL (ref 80.0–100.0)
Monocytes Absolute: 0.2 10*3/uL (ref 0.1–1.0)
Monocytes Relative: 7 %
Neutro Abs: 2.2 10*3/uL (ref 1.7–7.7)
Neutrophils Relative %: 71 %
Platelet Count: 82 10*3/uL — ABNORMAL LOW (ref 150–400)
RBC: 4.1 MIL/uL — ABNORMAL LOW (ref 4.22–5.81)
RDW: 13.2 % (ref 11.5–15.5)
WBC Count: 3.1 10*3/uL — ABNORMAL LOW (ref 4.0–10.5)
nRBC: 0 % (ref 0.0–0.2)

## 2022-12-14 MED ORDER — INFLUENZA VAC A&B SURF ANT ADJ 0.5 ML IM SUSY
0.5000 mL | PREFILLED_SYRINGE | Freq: Once | INTRAMUSCULAR | Status: AC
  Administered 2022-12-14: 0.5 mL via INTRAMUSCULAR
  Filled 2022-12-14: qty 0.5

## 2022-12-14 MED ORDER — OCTREOTIDE ACETATE 30 MG IM KIT
30.0000 mg | PACK | Freq: Once | INTRAMUSCULAR | Status: AC
  Administered 2022-12-14: 30 mg via INTRAMUSCULAR
  Filled 2022-12-14: qty 1

## 2022-12-14 NOTE — Patient Instructions (Addendum)
Octreotide Injection Solution What is this medication? OCTREOTIDE (ok TREE oh tide) treats high levels of growth hormone (acromegaly). It works by reducing the amount of growth hormone your body makes. This reduces symptoms and the risk of health problems caused by too much growth hormone, such as diabetes and heart disease. It may also be used to treat diarrhea caused by neuroendocrine tumors. It works by slowing down the release of serotonin from the tumor cells. This reduces the number of bowel movements you have. This medicine may be used for other purposes; ask your health care provider or pharmacist if you have questions. COMMON BRAND NAME(S): Berline Lopes, Sandostatin What should I tell my care team before I take this medication? They need to know if you have any of these conditions: Diabetes Gallbladder disease Kidney disease Liver disease Thyroid disease An unusual or allergic reaction to octreotide, other medications, foods, dyes, or preservatives Pregnant or trying to get pregnant Breast-feeding How should I use this medication? This medication is injected under the skin or into a vein. It is usually given by your care team in a hospital or clinic setting. If you get this medication at home, you will be taught how to prepare and give it. Use exactly as directed. Take it as directed on the prescription label at the same time every day. Keep taking it unless your care team tells you to stop. Allow the injection solution to come to room temperature before use. Do not warm it artificially. It is important that you put your used needles and syringes in a special sharps container. Do not put them in a trash can. If you do not have a sharps container, call your pharmacist or care team to get one. Talk to your care team about the use of this medication in children. Special care may be needed. Overdosage: If you think you have taken too much of this medicine contact a poison control center or  emergency room at once. NOTE: This medicine is only for you. Do not share this medicine with others. What if I miss a dose? If you miss a dose, take it as soon as you can. If it is almost time for your next dose, take only that dose. Do not take double or extra doses. What may interact with this medication? Bromocriptine Certain medications for blood pressure, heart disease, irregular heartbeat Cyclosporine Diuretics Medications for diabetes, including insulin Quinidine This list may not describe all possible interactions. Give your health care provider a list of all the medicines, herbs, non-prescription drugs, or dietary supplements you use. Also tell them if you smoke, drink alcohol, or use illegal drugs. Some items may interact with your medicine. What should I watch for while using this medication? Visit your care team for regular checks on your progress. Tell your care team if your symptoms do not start to get better or if they get worse. To help reduce irritation at the injection site, use a different site for each injection and make sure the solution is at room temperature before use. This medication may cause decreases in blood sugar. Signs of low blood sugar include chills, cool, pale skin or cold sweats, drowsiness, extreme hunger, fast heartbeat, headache, nausea, nervousness or anxiety, shakiness, trembling, unsteadiness, tiredness, or weakness. Contact your care team right away if you experience any of these symptoms. This medication may increase blood sugar. The risk may be higher in patients who already have diabetes. Ask your care team what you can do to lower your  risk of diabetes while taking this medication. You should make sure you get enough vitamin B12 while you are taking this medication. Discuss the foods you eat and the vitamins you take with your care team. What side effects may I notice from receiving this medication? Side effects that you should report to your care  team as soon as possible: Allergic reactions--skin rash, itching, hives, swelling of the face, lips, tongue, or throat Gallbladder problems--severe stomach pain, nausea, vomiting, fever Heart rhythm changes--fast or irregular heartbeat, dizziness, feeling faint or lightheaded, chest pain, trouble breathing High blood sugar (hyperglycemia)--increased thirst or amount of urine, unusual weakness or fatigue, blurry vision Low blood sugar (hypoglycemia)--tremors or shaking, anxiety, sweating, cold or clammy skin, confusion, dizziness, rapid heartbeat Low thyroid levels (hypothyroidism)--unusual weakness or fatigue, increased sensitivity to cold, constipation, hair loss, dry skin, weight gain, feelings of depression Low vitamin B12 level--pain, tingling, or numbness in the hands or feet, muscle weakness, dizziness, confusion, trouble concentrating Pancreatitis--severe stomach pain that spreads to your back or gets worse after eating or when touched, fever, nausea, vomiting Side effects that usually do not require medical attention (report to your care team if they continue or are bothersome): Diarrhea Dizziness Gas Headache Pain, redness, or irritation at injection site Stomach pain This list may not describe all possible side effects. Call your doctor for medical advice about side effects. You may report side effects to FDA at 1-800-FDA-1088. Where should I keep my medication? Keep out of the reach of children and pets. Store in the refrigerator. Protect from light. Allow to come to room temperature naturally. Do not use artificial heat. If protected from light, the injection may be stored between 20 and 30 degrees C (70 and 86 degrees F) for 14 days. After the initial use, throw away any unused portion of a multiple dose vial after 14 days. Get rid of any unused portions of the ampules after use. To get rid of medications that are no longer needed or have expired: Take the medication to a medication  take-back program. Ask your pharmacy or law enforcement to find a location. If you cannot return the medication, ask your pharmacist or care team how to get rid of the medication safely. NOTE: This sheet is a summary. It may not cover all possible information. If you have questions about this medicine, talk to your doctor, pharmacist, or health care provider.  2024 Elsevier/Gold Standard (2021-05-20 00:00:00)

## 2022-12-14 NOTE — Telephone Encounter (Signed)
-----   Message from Lonna Cobb sent at 12/14/2022  3:16 PM EDT ----- Please let him know blood counts are stable.  Call with signs of infection, bleeding.  Follow-up as scheduled.

## 2022-12-14 NOTE — Telephone Encounter (Signed)
Patient gave verbal understanding and had no further questions or concerns  

## 2022-12-16 ENCOUNTER — Other Ambulatory Visit: Payer: Self-pay

## 2022-12-21 ENCOUNTER — Other Ambulatory Visit: Payer: Self-pay

## 2022-12-21 NOTE — Progress Notes (Signed)
Specialty Pharmacy Refill Coordination Note  Michael Good. is a 67 y.o. male contacted today regarding refills of specialty medication(s) Everolimus (Antineoplastic Enzyme Inhibitors)   Patient requested Delivery   Delivery date: 12/26/22   Verified address: [address] [place], [place], [address]   Medication will be filled on 12/23/22.

## 2023-01-11 ENCOUNTER — Inpatient Hospital Stay: Payer: Medicare Other

## 2023-01-11 ENCOUNTER — Inpatient Hospital Stay: Payer: Medicare Other | Admitting: Oncology

## 2023-01-12 ENCOUNTER — Inpatient Hospital Stay: Payer: Medicare Other | Attending: Oncology

## 2023-01-12 ENCOUNTER — Inpatient Hospital Stay: Payer: Medicare Other | Admitting: Oncology

## 2023-01-12 ENCOUNTER — Inpatient Hospital Stay: Payer: Medicare Other

## 2023-01-12 VITALS — BP 152/78 | HR 71 | Temp 98.1°F | Resp 18 | Ht 73.0 in | Wt 178.2 lb

## 2023-01-12 DIAGNOSIS — D696 Thrombocytopenia, unspecified: Secondary | ICD-10-CM | POA: Diagnosis not present

## 2023-01-12 DIAGNOSIS — C7A090 Malignant carcinoid tumor of the bronchus and lung: Secondary | ICD-10-CM | POA: Diagnosis not present

## 2023-01-12 DIAGNOSIS — Z79899 Other long term (current) drug therapy: Secondary | ICD-10-CM | POA: Insufficient documentation

## 2023-01-12 DIAGNOSIS — C7B02 Secondary carcinoid tumors of liver: Secondary | ICD-10-CM | POA: Diagnosis present

## 2023-01-12 DIAGNOSIS — C7B03 Secondary carcinoid tumors of bone: Secondary | ICD-10-CM | POA: Insufficient documentation

## 2023-01-12 DIAGNOSIS — C7B8 Other secondary neuroendocrine tumors: Secondary | ICD-10-CM

## 2023-01-12 LAB — CBC WITH DIFFERENTIAL (CANCER CENTER ONLY)
Abs Immature Granulocytes: 0 10*3/uL (ref 0.00–0.07)
Basophils Absolute: 0 10*3/uL (ref 0.0–0.1)
Basophils Relative: 0 %
Eosinophils Absolute: 0 10*3/uL (ref 0.0–0.5)
Eosinophils Relative: 1 %
HCT: 36.6 % — ABNORMAL LOW (ref 39.0–52.0)
Hemoglobin: 13.2 g/dL (ref 13.0–17.0)
Immature Granulocytes: 0 %
Lymphocytes Relative: 25 %
Lymphs Abs: 0.7 10*3/uL (ref 0.7–4.0)
MCH: 29.5 pg (ref 26.0–34.0)
MCHC: 36.1 g/dL — ABNORMAL HIGH (ref 30.0–36.0)
MCV: 81.7 fL (ref 80.0–100.0)
Monocytes Absolute: 0.2 10*3/uL (ref 0.1–1.0)
Monocytes Relative: 7 %
Neutro Abs: 1.8 10*3/uL (ref 1.7–7.7)
Neutrophils Relative %: 67 %
Platelet Count: 80 10*3/uL — ABNORMAL LOW (ref 150–400)
RBC: 4.48 MIL/uL (ref 4.22–5.81)
RDW: 13.9 % (ref 11.5–15.5)
WBC Count: 2.8 10*3/uL — ABNORMAL LOW (ref 4.0–10.5)
nRBC: 0 % (ref 0.0–0.2)

## 2023-01-12 LAB — CMP (CANCER CENTER ONLY)
ALT: 23 U/L (ref 0–44)
AST: 21 U/L (ref 15–41)
Albumin: 4.2 g/dL (ref 3.5–5.0)
Alkaline Phosphatase: 102 U/L (ref 38–126)
Anion gap: 6 (ref 5–15)
BUN: 16 mg/dL (ref 8–23)
CO2: 29 mmol/L (ref 22–32)
Calcium: 9.3 mg/dL (ref 8.9–10.3)
Chloride: 102 mmol/L (ref 98–111)
Creatinine: 1.12 mg/dL (ref 0.61–1.24)
GFR, Estimated: >60 mL/min (ref >60)
Glucose, Bld: 400 mg/dL — ABNORMAL HIGH (ref 70–99)
Potassium: 4.3 mmol/L (ref 3.5–5.1)
Sodium: 137 mmol/L (ref 135–145)
Total Bilirubin: 1 mg/dL (ref <1.2)
Total Protein: 7.1 g/dL (ref 6.5–8.1)

## 2023-01-12 MED ORDER — OCTREOTIDE ACETATE 30 MG IM KIT
30.0000 mg | PACK | Freq: Once | INTRAMUSCULAR | Status: AC
  Administered 2023-01-12: 30 mg via INTRAMUSCULAR
  Filled 2023-01-12: qty 1

## 2023-01-12 NOTE — Patient Instructions (Signed)
[facility] - A DEPT OF [facility]  Discharge Instructions: Thank you for choosing [facility] to provide your oncology and hematology care.   If you have a lab appointment with the Cancer Center, please go directly to the Cancer Center and check in at the registration area.   Wear comfortable clothing and clothing appropriate for easy access to any Portacath or PICC line.   We strive to give you quality time with your provider. You may need to reschedule your appointment if you arrive late (15 or more minutes).  Arriving late affects you and other patients whose appointments are after yours.  Also, if you miss three or more appointments without notifying the office, you may be dismissed from the clinic at the provider's discretion.      For prescription refill requests, have your pharmacy contact our office and allow 72 hours for refills to be completed.    Today you received the following chemotherapy and/or immunotherapy agents Sandostatin      To help prevent nausea and vomiting after your treatment, we encourage you to take your nausea medication as directed.  BELOW ARE SYMPTOMS THAT SHOULD BE REPORTED IMMEDIATELY: *FEVER GREATER THAN 100.4 F (38 C) OR HIGHER *CHILLS OR SWEATING *NAUSEA AND VOMITING THAT IS NOT CONTROLLED WITH YOUR NAUSEA MEDICATION *UNUSUAL SHORTNESS OF BREATH *UNUSUAL BRUISING OR BLEEDING *URINARY PROBLEMS (pain or burning when urinating, or frequent urination) *BOWEL PROBLEMS (unusual diarrhea, constipation, pain near the anus) TENDERNESS IN MOUTH AND THROAT WITH OR WITHOUT PRESENCE OF ULCERS (sore throat, sores in mouth, or a toothache) UNUSUAL RASH, SWELLING OR PAIN  UNUSUAL VAGINAL DISCHARGE OR ITCHING   Items with * indicate a potential emergency and should be followed up as soon as possible or go to the Emergency Department if any problems should occur.  Please show the CHEMOTHERAPY ALERT CARD or  IMMUNOTHERAPY ALERT CARD at check-in to the Emergency Department and triage nurse.  Should you have questions after your visit or need to cancel or reschedule your appointment, please contact [facility] - A DEPT OF [facility]  Dept: [phone]  and follow the prompts.  Office hours are 8:00 a.m. to 4:30 p.m. Monday - Friday. Please note that voicemails left after 4:00 p.m. may not be returned until the following business day.  We are closed weekends and major holidays. You have access to a nurse at all times for urgent questions. Please call the main number to the clinic Dept: [phone] and follow the prompts.   For any non-urgent questions, you may also contact your provider using MyChart. We now offer e-Visits for anyone 43 and older to request care online for non-urgent symptoms. For details visit [email or web address].   Also download the MyChart app! Go to the app store, search "MyChart", open the app, select , and log in with your MyChart username and password.  Octreotide Injection Solution What is this medication? OCTREOTIDE (ok TREE oh tide) treats high levels of growth hormone (acromegaly). It works by reducing the amount of growth hormone your body makes. This reduces symptoms and the risk of health problems caused by too much growth hormone, such as diabetes and heart disease. It may also be used to treat diarrhea caused by neuroendocrine tumors. It works by slowing down the release of serotonin from the tumor cells. This reduces the number of bowel movements you have. This medicine may be used for other purposes; ask your  health care provider or pharmacist if you have questions. COMMON BRAND NAME(S): Berline Lopes, Sandostatin What should I tell my care team before I take this medication? They need to know if you have any of these conditions: Diabetes Gallbladder disease Kidney disease Liver disease Thyroid disease An unusual or  allergic reaction to octreotide, other medications, foods, dyes, or preservatives Pregnant or trying to get pregnant Breast-feeding How should I use this medication? This medication is injected under the skin or into a vein. It is usually given by your care team in a hospital or clinic setting. If you get this medication at home, you will be taught how to prepare and give it. Use exactly as directed. Take it as directed on the prescription label at the same time every day. Keep taking it unless your care team tells you to stop. Allow the injection solution to come to room temperature before use. Do not warm it artificially. It is important that you put your used needles and syringes in a special sharps container. Do not put them in a trash can. If you do not have a sharps container, call your pharmacist or care team to get one. Talk to your care team about the use of this medication in children. Special care may be needed. Overdosage: If you think you have taken too much of this medicine contact a poison control center or emergency room at once. NOTE: This medicine is only for you. Do not share this medicine with others. What if I miss a dose? If you miss a dose, take it as soon as you can. If it is almost time for your next dose, take only that dose. Do not take double or extra doses. What may interact with this medication? Bromocriptine Certain medications for blood pressure, heart disease, irregular heartbeat Cyclosporine Diuretics Medications for diabetes, including insulin Quinidine This list may not describe all possible interactions. Give your health care provider a list of all the medicines, herbs, non-prescription drugs, or dietary supplements you use. Also tell them if you smoke, drink alcohol, or use illegal drugs. Some items may interact with your medicine. What should I watch for while using this medication? Visit your care team for regular checks on your progress. Tell your care  team if your symptoms do not start to get better or if they get worse. To help reduce irritation at the injection site, use a different site for each injection and make sure the solution is at room temperature before use. This medication may cause decreases in blood sugar. Signs of low blood sugar include chills, cool, pale skin or cold sweats, drowsiness, extreme hunger, fast heartbeat, headache, nausea, nervousness or anxiety, shakiness, trembling, unsteadiness, tiredness, or weakness. Contact your care team right away if you experience any of these symptoms. This medication may increase blood sugar. The risk may be higher in patients who already have diabetes. Ask your care team what you can do to lower your risk of diabetes while taking this medication. You should make sure you get enough vitamin B12 while you are taking this medication. Discuss the foods you eat and the vitamins you take with your care team. What side effects may I notice from receiving this medication? Side effects that you should report to your care team as soon as possible: Allergic reactions--skin rash, itching, hives, swelling of the face, lips, tongue, or throat Gallbladder problems--severe stomach pain, nausea, vomiting, fever Heart rhythm changes--fast or irregular heartbeat, dizziness, feeling faint or lightheaded, chest pain, trouble breathing  High blood sugar (hyperglycemia)--increased thirst or amount of urine, unusual weakness or fatigue, blurry vision Low blood sugar (hypoglycemia)--tremors or shaking, anxiety, sweating, cold or clammy skin, confusion, dizziness, rapid heartbeat Low thyroid levels (hypothyroidism)--unusual weakness or fatigue, increased sensitivity to cold, constipation, hair loss, dry skin, weight gain, feelings of depression Low vitamin B12 level--pain, tingling, or numbness in the hands or feet, muscle weakness, dizziness, confusion, trouble concentrating Pancreatitis--severe stomach pain that  spreads to your back or gets worse after eating or when touched, fever, nausea, vomiting Side effects that usually do not require medical attention (report to your care team if they continue or are bothersome): Diarrhea Dizziness Gas Headache Pain, redness, or irritation at injection site Stomach pain This list may not describe all possible side effects. Call your doctor for medical advice about side effects. You may report side effects to FDA at 1-800-FDA-1088. Where should I keep my medication? Keep out of the reach of children and pets. Store in the refrigerator. Protect from light. Allow to come to room temperature naturally. Do not use artificial heat. If protected from light, the injection may be stored between 20 and 30 degrees C (70 and 86 degrees F) for 14 days. After the initial use, throw away any unused portion of a multiple dose vial after 14 days. Get rid of any unused portions of the ampules after use. To get rid of medications that are no longer needed or have expired: Take the medication to a medication take-back program. Ask your pharmacy or law enforcement to find a location. If you cannot return the medication, ask your pharmacist or care team how to get rid of the medication safely. NOTE: This sheet is a summary. It may not cover all possible information. If you have questions about this medicine, talk to your doctor, pharmacist, or health care provider.  2024 Elsevier/Gold Standard (2021-05-20 00:00:00)

## 2023-01-12 NOTE — Progress Notes (Signed)
[facility] OFFICE PROGRESS Good   Diagnosis: Carcinoid tumor  INTERVAL HISTORY:   Michael Good returns as scheduled.  He continues Photographer.  No mouth sores or diarrhea.  No fever or flushing.  He reports a poor appetite.  He works 6 days/week.  He is seeing urology to evaluate erectile dysfunction.  Cialis did not help.  He has been prescribed testosterone.  Objective:  Vital signs in last 24 hours:  Blood pressure (!) 152/78, pulse 71, temperature 98.1 F (36.7 C), temperature source Temporal, resp. rate 18, height 6\' 1"  (1.854 m), weight 178 lb 3.2 oz (80.8 kg), SpO2 100%.    HEENT: No thrush, mild erythema at the buccal mucosa bilaterally without discrete ulcers, small healing ulceration at the left lower lip Lymphatics: No cervical, supraclavicular, or inguinal nodes.  Prominent bilateral axillary fat pads Resp: Lungs clear bilaterally Cardio: Regular rate and rhythm GI: No hepatosplenomegaly Vascular: No leg edema   Lab Results:  Lab Results  Component Value Date   WBC 2.8 (L) 01/12/2023   HGB 13.2 01/12/2023   HCT 36.6 (L) 01/12/2023   MCV 81.7 01/12/2023   PLT 80 (L) 01/12/2023   NEUTROABS 1.8 01/12/2023    CMP  Lab Results  Component Value Date   NA 134 (L) 09/21/2022   K 3.9 09/21/2022   CL 101 09/21/2022   CO2 26 09/21/2022   GLUCOSE 470 (H) 09/21/2022   BUN 17 09/21/2022   CREATININE 1.04 09/21/2022   CALCIUM 8.9 09/21/2022   PROT 6.8 09/21/2022   ALBUMIN 4.1 09/21/2022   AST 19 09/21/2022   ALT 19 09/21/2022   ALKPHOS 104 09/21/2022   BILITOT 1.1 09/21/2022   GFRNONAA >60 09/21/2022    Lab Results  Component Value Date   CEA 2.77 07/19/2022    No results found for: "INR", "LABPROT"  Imaging:  No results found.  Medications: I have reviewed the patient's current medications.   Assessment/Plan:  Metastatic carcinoid tumor Left lower lob[facility] February [facility] at [facility], 2.2 cm typical carcinoid tumor Laparoscopic  cholecystectomy November 2014, imaging revealed a 4.4 cm segment 8 liver lesion 03/18/2013-central hepatectomy and drainage of intra-abdominal abscess,- low-grade neuroendocrine carcinoma, tumor appeared totally excised,Ki-67 less than 5% CT's 05/09/2014, 08/16/2016, 09/19/2017-no evidence of recurrent disease CTs 914 2020-3 new liver lesions 12/26/2018 PET dotatate scan-at least 20 liver lesions and extensive bone metastases, hypermetabolic pancreas tail lesion 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 01/16/2019-biopsy of the left hepatic lobe lesion-metastatic neuroendocrine carcinoma,Ki-67 10%- interpretation limited due to scant remaining tumor may not be representative of the overall lesion, mismatch repair protein staining intact 02/27/2019-Sandostatin 30 mg every 4 weeks 09/18/2019- Netspot PET-increased activity in the pancreas tail and liver and bone metastases Late August 2021-everolimus, 7.5 mg daily, placed on hold September 2021 due to acute hyperglycemia/new onset diabetes 01/22/2020-everolimus resumed Netspot 09/29/2020-innumerable liver metastases, unchanged, stable tracer uptake in distal pancreas lesion, innumerable bone lesions, significant decrease in tracer uptake in left humeral lesion, mild decrease in FDG uptake in other bone lesions, no new site of metastatic disease Netspot 02/02/2022-no change from scan on 09/29/2020, multifocal hepatic and bone metastases.  Small tail of the pancreas lesion Sandostatin and everolimus continued Diabetes ERCP removal of bile duct stones March 2015, March 2018 Sleep apnea Hypertension COVID-19 infection September 2021 Cholecystectomy November 2014 Umbilical hernia repair 03/18/2013 Erectile dysfunction Chronic thrombocytopenia  Disposition: Michael Good appears stable.  He continues monthly Sandostatin and daily everolimus.  He has stable mild leukopenia and  thrombocytopenia.  He will continue the current treatment.  He will be referred for a  restaging dotatate PET next month.  Michael Good will return for an office visit in 3 months.  Thornton Papas, MD  01/12/2023  4:01 PM

## 2023-01-13 ENCOUNTER — Telehealth: Payer: Self-pay

## 2023-01-13 NOTE — Telephone Encounter (Signed)
Patient was here and seen on 01/12/23, and wanted to call and schedule his scan prior to me scheduling any upcoming appointments. Reached out to patient on 01/13/23 and left voicemail for him to call me back so we can work on his schedule. Also sent patient a MyChart message to call me.

## 2023-01-14 LAB — CHROMOGRANIN A: Chromogranin A (ng/mL): 819.5 ng/mL — ABNORMAL HIGH (ref 0.0–101.8)

## 2023-01-19 ENCOUNTER — Other Ambulatory Visit (HOSPITAL_COMMUNITY): Payer: Self-pay

## 2023-01-19 ENCOUNTER — Other Ambulatory Visit: Payer: Self-pay | Admitting: Oncology

## 2023-01-19 ENCOUNTER — Other Ambulatory Visit: Payer: Self-pay

## 2023-01-19 DIAGNOSIS — C7A090 Malignant carcinoid tumor of the bronchus and lung: Secondary | ICD-10-CM

## 2023-01-19 MED ORDER — EVEROLIMUS 7.5 MG PO TABS
ORAL_TABLET | Freq: Every day | ORAL | 2 refills | Status: DC
  Filled 2023-01-19: qty 30, 30d supply, fill #0
  Filled 2023-02-21: qty 30, 30d supply, fill #1
  Filled 2023-03-22: qty 30, 30d supply, fill #2

## 2023-01-19 NOTE — Progress Notes (Signed)
Specialty Pharmacy Refill Coordination Note  Michael Monin. is a 67 y.o. male contacted today regarding refills of specialty medication(s) Everolimus (Antineoplastic Enzyme Inhibitors)   Patient requested Delivery   Delivery date: 02/01/23   Verified address: [address]   [place] [address]   Medication will be filled on 02/10/23 pending a refill request.

## 2023-01-31 ENCOUNTER — Other Ambulatory Visit: Payer: Self-pay

## 2023-02-07 ENCOUNTER — Other Ambulatory Visit: Payer: Self-pay

## 2023-02-07 ENCOUNTER — Encounter (HOSPITAL_COMMUNITY): Payer: Medicare Other

## 2023-02-07 ENCOUNTER — Ambulatory Visit: Payer: Medicare Other

## 2023-02-07 ENCOUNTER — Other Ambulatory Visit: Payer: Medicare Other

## 2023-02-13 ENCOUNTER — Other Ambulatory Visit: Payer: Self-pay | Admitting: Nurse Practitioner

## 2023-02-13 DIAGNOSIS — C7A090 Malignant carcinoid tumor of the bronchus and lung: Secondary | ICD-10-CM

## 2023-02-14 ENCOUNTER — Inpatient Hospital Stay: Payer: Medicare Other

## 2023-02-14 ENCOUNTER — Inpatient Hospital Stay: Payer: Medicare Other | Attending: Oncology

## 2023-02-14 ENCOUNTER — Ambulatory Visit (HOSPITAL_COMMUNITY)
Admission: RE | Admit: 2023-02-14 | Discharge: 2023-02-14 | Disposition: A | Discharge status: Home / Self Care | Payer: Medicare Other | Source: Ambulatory Visit | Attending: Oncology | Admitting: Oncology

## 2023-02-14 DIAGNOSIS — C7B03 Secondary carcinoid tumors of bone: Secondary | ICD-10-CM | POA: Insufficient documentation

## 2023-02-14 DIAGNOSIS — C7A090 Malignant carcinoid tumor of the bronchus and lung: Secondary | ICD-10-CM | POA: Insufficient documentation

## 2023-02-14 DIAGNOSIS — C7B02 Secondary carcinoid tumors of liver: Secondary | ICD-10-CM | POA: Diagnosis present

## 2023-02-14 LAB — CBC WITH DIFFERENTIAL (CANCER CENTER ONLY)
Abs Immature Granulocytes: 0.01 10*3/uL (ref 0.00–0.07)
Basophils Absolute: 0 10*3/uL (ref 0.0–0.1)
Basophils Relative: 0 %
Eosinophils Absolute: 0 10*3/uL (ref 0.0–0.5)
Eosinophils Relative: 1 %
HCT: 38.1 % — ABNORMAL LOW (ref 39.0–52.0)
Hemoglobin: 13.9 g/dL (ref 13.0–17.0)
Immature Granulocytes: 0 %
Lymphocytes Relative: 27 %
Lymphs Abs: 0.9 10*3/uL (ref 0.7–4.0)
MCH: 29.7 pg (ref 26.0–34.0)
MCHC: 36.5 g/dL — ABNORMAL HIGH (ref 30.0–36.0)
MCV: 81.4 fL (ref 80.0–100.0)
Monocytes Absolute: 0.2 10*3/uL (ref 0.1–1.0)
Monocytes Relative: 7 %
Neutro Abs: 2 10*3/uL (ref 1.7–7.7)
Neutrophils Relative %: 65 %
Platelet Count: 77 10*3/uL — ABNORMAL LOW (ref 150–400)
RBC: 4.68 MIL/uL (ref 4.22–5.81)
RDW: 13.5 % (ref 11.5–15.5)
WBC Count: 3.1 10*3/uL — ABNORMAL LOW (ref 4.0–10.5)
nRBC: 0 % (ref 0.0–0.2)

## 2023-02-14 MED ORDER — OCTREOTIDE ACETATE 30 MG IM KIT
30.0000 mg | PACK | Freq: Once | INTRAMUSCULAR | Status: AC
  Administered 2023-02-14: 30 mg via INTRAMUSCULAR
  Filled 2023-02-14: qty 1

## 2023-02-14 MED ORDER — COPPER CU 64 DOTATATE 1 MCI/ML IV SOLN
4.0000 | Freq: Once | INTRAVENOUS | Status: AC
  Administered 2023-02-14: 3.75 via INTRAVENOUS

## 2023-02-21 ENCOUNTER — Other Ambulatory Visit: Payer: Self-pay

## 2023-02-21 NOTE — Progress Notes (Signed)
Specialty Pharmacy Refill Coordination Note  Jarod Angeline. is a 67 y.o. male contacted today regarding refills of specialty medication(s) Everolimus [name])   Patient requested Delivery   Delivery date: 02/28/23  Patient aware [employer] may not arrive until 1/2.    Verified address: [address]   [place] [address]   Medication will be filled on 02/27/23.

## 2023-02-23 ENCOUNTER — Other Ambulatory Visit (HOSPITAL_COMMUNITY): Payer: Self-pay

## 2023-02-24 ENCOUNTER — Other Ambulatory Visit (HOSPITAL_COMMUNITY): Payer: Self-pay

## 2023-02-24 ENCOUNTER — Telehealth: Payer: Self-pay | Admitting: *Deleted

## 2023-02-24 NOTE — Telephone Encounter (Signed)
LVM to check MyChart for Dr. Kalman Drape assessment of his scan.

## 2023-02-24 NOTE — Telephone Encounter (Signed)
-----   Message from Thornton Papas sent at 02/23/2023  1:45 PM EST ----- Please call patient, the PET is unchanged from last year, stable liver and bone lesions, continue current treatment, follow-up as scheduled

## 2023-02-27 ENCOUNTER — Other Ambulatory Visit: Payer: Self-pay

## 2023-03-08 ENCOUNTER — Ambulatory Visit: Payer: Medicare Other

## 2023-03-08 ENCOUNTER — Other Ambulatory Visit: Payer: Medicare Other

## 2023-03-15 ENCOUNTER — Inpatient Hospital Stay: Payer: Medicare Other | Attending: Oncology

## 2023-03-15 ENCOUNTER — Other Ambulatory Visit: Payer: Medicare Other

## 2023-03-15 ENCOUNTER — Inpatient Hospital Stay: Payer: Medicare Other

## 2023-03-15 VITALS — BP 154/67 | HR 70 | Temp 98.3°F | Resp 18 | Ht 73.0 in | Wt 180.8 lb

## 2023-03-15 DIAGNOSIS — C7B02 Secondary carcinoid tumors of liver: Secondary | ICD-10-CM | POA: Diagnosis present

## 2023-03-15 DIAGNOSIS — C7A090 Malignant carcinoid tumor of the bronchus and lung: Secondary | ICD-10-CM

## 2023-03-15 DIAGNOSIS — C7B03 Secondary carcinoid tumors of bone: Secondary | ICD-10-CM | POA: Insufficient documentation

## 2023-03-15 LAB — CBC WITH DIFFERENTIAL (CANCER CENTER ONLY)
Abs Immature Granulocytes: 0.01 10*3/uL (ref 0.00–0.07)
Basophils Absolute: 0 10*3/uL (ref 0.0–0.1)
Basophils Relative: 0 %
Eosinophils Absolute: 0 10*3/uL (ref 0.0–0.5)
Eosinophils Relative: 2 %
HCT: 36.1 % — ABNORMAL LOW (ref 39.0–52.0)
Hemoglobin: 13.5 g/dL (ref 13.0–17.0)
Immature Granulocytes: 0 %
Lymphocytes Relative: 25 %
Lymphs Abs: 0.6 10*3/uL — ABNORMAL LOW (ref 0.7–4.0)
MCH: 30.6 pg (ref 26.0–34.0)
MCHC: 37.4 g/dL — ABNORMAL HIGH (ref 30.0–36.0)
MCV: 81.9 fL (ref 80.0–100.0)
Monocytes Absolute: 0.2 10*3/uL (ref 0.1–1.0)
Monocytes Relative: 7 %
Neutro Abs: 1.7 10*3/uL (ref 1.7–7.7)
Neutrophils Relative %: 66 %
Platelet Count: 74 10*3/uL — ABNORMAL LOW (ref 150–400)
RBC: 4.41 MIL/uL (ref 4.22–5.81)
RDW: 13.6 % (ref 11.5–15.5)
WBC Count: 2.5 10*3/uL — ABNORMAL LOW (ref 4.0–10.5)
nRBC: 0 % (ref 0.0–0.2)

## 2023-03-15 LAB — CMP (CANCER CENTER ONLY)
ALT: 32 U/L (ref 0–44)
AST: 27 U/L (ref 15–41)
Albumin: 4.4 g/dL (ref 3.5–5.0)
Alkaline Phosphatase: 99 U/L (ref 38–126)
Anion gap: 6 (ref 5–15)
BUN: 13 mg/dL (ref 8–23)
CO2: 29 mmol/L (ref 22–32)
Calcium: 8.8 mg/dL — ABNORMAL LOW (ref 8.9–10.3)
Chloride: 102 mmol/L (ref 98–111)
Creatinine: 1.01 mg/dL (ref 0.61–1.24)
GFR, Estimated: >60 mL/min (ref >60)
Glucose, Bld: 419 mg/dL — ABNORMAL HIGH (ref 70–99)
Potassium: 4.2 mmol/L (ref 3.5–5.1)
Sodium: 137 mmol/L (ref 135–145)
Total Bilirubin: 1.3 mg/dL — ABNORMAL HIGH (ref 0.0–1.2)
Total Protein: 6.9 g/dL (ref 6.5–8.1)

## 2023-03-15 MED ORDER — OCTREOTIDE ACETATE 30 MG IM KIT
30.0000 mg | PACK | Freq: Once | INTRAMUSCULAR | Status: AC
  Administered 2023-03-15: 30 mg via INTRAMUSCULAR
  Filled 2023-03-15: qty 1

## 2023-03-15 NOTE — Patient Instructions (Signed)
 [facility] - A DEPT OF [facility]   Discharge Instructions: Thank you for choosing [facility] to provide your oncology and hematology care.   If you have a lab appointment with the Cancer Center, please go directly to the Cancer Center and check in at the registration area.   Wear comfortable clothing and clothing appropriate for easy access to any Portacath or PICC line.   We strive to give you quality time with your provider. You may need to reschedule your appointment if you arrive late (15 or more minutes).  Arriving late affects you and other patients whose appointments are after yours.  Also, if you miss three or more appointments without notifying the office, you may be dismissed from the clinic at the provider's discretion.      For prescription refill requests, have your pharmacy contact our office and allow 72 hours for refills to be completed.    Today you received the following chemotherapy and/or immunotherapy agents SANDOSTATIN       To help prevent nausea and vomiting after your treatment, we encourage you to take your nausea medication as directed.  BELOW ARE SYMPTOMS THAT SHOULD BE REPORTED IMMEDIATELY: *FEVER GREATER THAN 100.4 F (38 C) OR HIGHER *CHILLS OR SWEATING *NAUSEA AND VOMITING THAT IS NOT CONTROLLED WITH YOUR NAUSEA MEDICATION *UNUSUAL SHORTNESS OF BREATH *UNUSUAL BRUISING OR BLEEDING *URINARY PROBLEMS (pain or burning when urinating, or frequent urination) *BOWEL PROBLEMS (unusual diarrhea, constipation, pain near the anus) TENDERNESS IN MOUTH AND THROAT WITH OR WITHOUT PRESENCE OF ULCERS (sore throat, sores in mouth, or a toothache) UNUSUAL RASH, SWELLING OR PAIN  UNUSUAL VAGINAL DISCHARGE OR ITCHING   Items with * indicate a potential emergency and should be followed up as soon as possible or go to the Emergency Department if any problems should occur.  Please show the CHEMOTHERAPY ALERT CARD or  IMMUNOTHERAPY ALERT CARD at check-in to the Emergency Department and triage nurse.  Should you have questions after your visit or need to cancel or reschedule your appointment, please contact [facility]  Dept: [phone]  and follow the prompts.  Office hours are 8:00 a.m. to 4:30 p.m. Monday - Friday. Please note that voicemails left after 4:00 p.m. may not be returned until the following business day.  We are closed weekends and major holidays. You have access to a nurse at all times for urgent questions. Please call the main number to the clinic Dept: [phone] and follow the prompts.   For any non-urgent questions, you may also contact your provider using MyChart. We now offer e-Visits for anyone 75 and older to request care online for non-urgent symptoms. For details visit [email or web address].   Also download the MyChart app! Go to the app store, search "MyChart", open the app, select [place], and log in with your MyChart username and password.  Octreotide  Injection Solution What is this medication? OCTREOTIDE  (ok TREE oh tide) treats high levels of growth hormone (acromegaly). It works by reducing the amount of growth hormone your body makes. This reduces symptoms and the risk of health problems caused by too much growth hormone, such as diabetes and heart disease. It may also be used to treat diarrhea caused by neuroendocrine tumors. It works by slowing down the release of serotonin from the tumor cells. This reduces the number of bowel movements you have. This medicine may be used for other purposes; ask  your health care provider or pharmacist if you have questions. COMMON BRAND NAME(S): Bynfezia, Sandostatin  What should I tell my care team before I take this medication? They need to know if you have any of these conditions: Diabetes Gallbladder disease Kidney disease Liver disease Thyroid disease An unusual or  allergic reaction to octreotide , other medications, foods, dyes, or preservatives Pregnant or trying to get pregnant Breast-feeding How should I use this medication? This medication is injected under the skin or into a vein. It is usually given by your care team in a hospital or clinic setting. If you get this medication at home, you will be taught how to prepare and give it. Use exactly as directed. Take it as directed on the prescription label at the same time every day. Keep taking it unless your care team tells you to stop. Allow the injection solution to come to room temperature before use. Do not warm it artificially. It is important that you put your used needles and syringes in a special sharps container. Do not put them in a trash can. If you do not have a sharps container, call your pharmacist or care team to get one. Talk to your care team about the use of this medication in children. Special care may be needed. Overdosage: If you think you have taken too much of this medicine contact a poison control center or emergency room at once. NOTE: This medicine is only for you. Do not share this medicine with others. What if I miss a dose? If you miss a dose, take it as soon as you can. If it is almost time for your next dose, take only that dose. Do not take double or extra doses. What may interact with this medication? Bromocriptine Certain medications for blood pressure, heart disease, irregular heartbeat Cyclosporine Diuretics Medications for diabetes, including insulin Quinidine This list may not describe all possible interactions. Give your health care provider a list of all the medicines, herbs, non-prescription drugs, or dietary supplements you use. Also tell them if you smoke, drink alcohol, or use illegal drugs. Some items may interact with your medicine. What should I watch for while using this medication? Visit your care team for regular checks on your progress. Tell your care  team if your symptoms do not start to get better or if they get worse. To help reduce irritation at the injection site, use a different site for each injection and make sure the solution is at room temperature before use. This medication may cause decreases in blood sugar. Signs of low blood sugar include chills, cool, pale skin or cold sweats, drowsiness, extreme hunger, fast heartbeat, headache, nausea, nervousness or anxiety, shakiness, trembling, unsteadiness, tiredness, or weakness. Contact your care team right away if you experience any of these symptoms. This medication may increase blood sugar. The risk may be higher in patients who already have diabetes. Ask your care team what you can do to lower your risk of diabetes while taking this medication. You should make sure you get enough vitamin B12 while you are taking this medication. Discuss the foods you eat and the vitamins you take with your care team. What side effects may I notice from receiving this medication? Side effects that you should report to your care team as soon as possible: Allergic reactions--skin rash, itching, hives, swelling of the face, lips, tongue, or throat Gallbladder problems--severe stomach pain, nausea, vomiting, fever Heart rhythm changes--fast or irregular heartbeat, dizziness, feeling faint or lightheaded, chest pain, trouble  breathing High blood sugar (hyperglycemia)--increased thirst or amount of urine, unusual weakness or fatigue, blurry vision Low blood sugar (hypoglycemia)--tremors or shaking, anxiety, sweating, cold or clammy skin, confusion, dizziness, rapid heartbeat Low thyroid levels (hypothyroidism)--unusual weakness or fatigue, increased sensitivity to cold, constipation, hair loss, dry skin, weight gain, feelings of depression Low vitamin B12 level--pain, tingling, or numbness in the hands or feet, muscle weakness, dizziness, confusion, trouble concentrating Pancreatitis--severe stomach pain that  spreads to your back or gets worse after eating or when touched, fever, nausea, vomiting Side effects that usually do not require medical attention (report to your care team if they continue or are bothersome): Diarrhea Dizziness Gas Headache Pain, redness, or irritation at injection site Stomach pain This list may not describe all possible side effects. Call your doctor for medical advice about side effects. You may report side effects to FDA at 1-800-FDA-1088. Where should I keep my medication? Keep out of the reach of children and pets. Store in the refrigerator. Protect from light. Allow to come to room temperature naturally. Do not use artificial heat. If protected from light, the injection may be stored between 20 and 30 degrees C (70 and 86 degrees F) for 14 days. After the initial use, throw away any unused portion of a multiple dose vial after 14 days. Get rid of any unused portions of the ampules after use. To get rid of medications that are no longer needed or have expired: Take the medication to a medication take-back program. Ask your pharmacy or law enforcement to find a location. If you cannot return the medication, ask your pharmacist or care team how to get rid of the medication safely. NOTE: This sheet is a summary. It may not cover all possible information. If you have questions about this medicine, talk to your doctor, pharmacist, or health care provider.  2024 Elsevier/Gold Standard (2021-05-20 00:00:00)

## 2023-03-16 ENCOUNTER — Other Ambulatory Visit: Payer: Medicare Other

## 2023-03-16 ENCOUNTER — Ambulatory Visit: Payer: Medicare Other

## 2023-03-17 LAB — CHROMOGRANIN A: Chromogranin A (ng/mL): 996.2 ng/mL — ABNORMAL HIGH (ref 0.0–101.8)

## 2023-03-22 ENCOUNTER — Other Ambulatory Visit (HOSPITAL_COMMUNITY): Payer: Self-pay

## 2023-03-22 ENCOUNTER — Other Ambulatory Visit: Payer: Self-pay

## 2023-03-22 NOTE — Progress Notes (Signed)
Specialty Pharmacy Refill Coordination Note  Michael Good. is a 68 y.o. male contacted today regarding refills of specialty medication(s) Everolimus [name])   Patient requested Delivery   Delivery date: 03/29/23   Verified address: [address]   [place] [address]   Medication will be filled on 03/28/23.

## 2023-03-23 ENCOUNTER — Other Ambulatory Visit: Payer: Self-pay

## 2023-03-28 ENCOUNTER — Other Ambulatory Visit: Payer: Self-pay

## 2023-04-05 ENCOUNTER — Other Ambulatory Visit: Payer: Medicare Other

## 2023-04-05 ENCOUNTER — Ambulatory Visit: Payer: Medicare Other

## 2023-04-05 ENCOUNTER — Ambulatory Visit: Payer: Medicare Other | Admitting: Nurse Practitioner

## 2023-04-06 ENCOUNTER — Other Ambulatory Visit: Payer: Self-pay

## 2023-04-06 DIAGNOSIS — C7A8 Other malignant neuroendocrine tumors: Secondary | ICD-10-CM

## 2023-04-12 ENCOUNTER — Inpatient Hospital Stay (HOSPITAL_BASED_OUTPATIENT_CLINIC_OR_DEPARTMENT_OTHER): Payer: Medicare Other | Admitting: Nurse Practitioner

## 2023-04-12 ENCOUNTER — Inpatient Hospital Stay: Payer: Medicare Other | Attending: Oncology

## 2023-04-12 ENCOUNTER — Inpatient Hospital Stay: Payer: Medicare Other

## 2023-04-12 ENCOUNTER — Encounter: Payer: Self-pay | Admitting: Nurse Practitioner

## 2023-04-12 VITALS — BP 149/77 | HR 70 | Temp 97.7°F | Resp 18 | Ht 73.0 in | Wt 178.2 lb

## 2023-04-12 DIAGNOSIS — C7A090 Malignant carcinoid tumor of the bronchus and lung: Secondary | ICD-10-CM | POA: Diagnosis present

## 2023-04-12 DIAGNOSIS — C7A8 Other malignant neuroendocrine tumors: Secondary | ICD-10-CM | POA: Diagnosis not present

## 2023-04-12 DIAGNOSIS — C7B03 Secondary carcinoid tumors of bone: Secondary | ICD-10-CM | POA: Insufficient documentation

## 2023-04-12 DIAGNOSIS — C7B02 Secondary carcinoid tumors of liver: Secondary | ICD-10-CM | POA: Diagnosis present

## 2023-04-12 LAB — CBC WITH DIFFERENTIAL (CANCER CENTER ONLY)
Abs Immature Granulocytes: 0.01 10*3/uL (ref 0.00–0.07)
Basophils Absolute: 0 10*3/uL (ref 0.0–0.1)
Basophils Relative: 0 %
Eosinophils Absolute: 0 10*3/uL (ref 0.0–0.5)
Eosinophils Relative: 1 %
HCT: 35.3 % — ABNORMAL LOW (ref 39.0–52.0)
Hemoglobin: 13.2 g/dL (ref 13.0–17.0)
Immature Granulocytes: 0 %
Lymphocytes Relative: 23 %
Lymphs Abs: 0.7 10*3/uL (ref 0.7–4.0)
MCH: 30.6 pg (ref 26.0–34.0)
MCHC: 37.4 g/dL — ABNORMAL HIGH (ref 30.0–36.0)
MCV: 81.9 fL (ref 80.0–100.0)
Monocytes Absolute: 0.2 10*3/uL (ref 0.1–1.0)
Monocytes Relative: 6 %
Neutro Abs: 2 10*3/uL (ref 1.7–7.7)
Neutrophils Relative %: 70 %
Platelet Count: 111 10*3/uL — ABNORMAL LOW (ref 150–400)
RBC: 4.31 MIL/uL (ref 4.22–5.81)
RDW: 13.2 % (ref 11.5–15.5)
WBC Count: 2.9 10*3/uL — ABNORMAL LOW (ref 4.0–10.5)
nRBC: 0 % (ref 0.0–0.2)

## 2023-04-12 LAB — CMP (CANCER CENTER ONLY)
ALT: 38 U/L (ref 0–44)
AST: 29 U/L (ref 15–41)
Albumin: 4.6 g/dL (ref 3.5–5.0)
Alkaline Phosphatase: 105 U/L (ref 38–126)
Anion gap: 6 (ref 5–15)
BUN: 20 mg/dL (ref 8–23)
CO2: 28 mmol/L (ref 22–32)
Calcium: 8.5 mg/dL — ABNORMAL LOW (ref 8.9–10.3)
Chloride: 100 mmol/L (ref 98–111)
Creatinine: 1.11 mg/dL (ref 0.61–1.24)
GFR, Estimated: >60 mL/min (ref >60)
Glucose, Bld: 463 mg/dL — ABNORMAL HIGH (ref 70–99)
Potassium: 4 mmol/L (ref 3.5–5.1)
Sodium: 134 mmol/L — ABNORMAL LOW (ref 135–145)
Total Bilirubin: 1.6 mg/dL — ABNORMAL HIGH (ref 0.0–1.2)
Total Protein: 7.3 g/dL (ref 6.5–8.1)

## 2023-04-12 MED ORDER — OCTREOTIDE ACETATE 30 MG IM KIT
30.0000 mg | PACK | Freq: Once | INTRAMUSCULAR | Status: AC
Start: 2023-04-12 — End: 2023-04-12
  Administered 2023-04-12: 30 mg via INTRAMUSCULAR
  Filled 2023-04-12: qty 1

## 2023-04-12 NOTE — Progress Notes (Signed)
  [facility] OFFICE PROGRESS NOTE   Diagnosis: Carcinoid tumor  INTERVAL HISTORY:   Michael Good returns as scheduled.  He continues Afinitor.  No mouth sores.  He has an occasional loose stool.  He denies nausea/vomiting.  Appetite varies.  Weight is "up-and-down".  Objective:  Vital signs in last 24 hours:  Blood pressure (!) 149/77, pulse 70, temperature 97.7 F (36.5 C), temperature source Oral, resp. rate 18, height 6\' 1"  (1.854 m), weight 178 lb 3.2 oz (80.8 kg), SpO2 100%.    HEENT: No thrush or ulcers. Resp: Clear bilaterally. Cardio: Regular rate and rhythm. GI: No hepatosplenomegaly. Vascular: No leg edema. Skin: Palms without erythema.  Lab Results:  Lab Results  Component Value Date   WBC 2.5 (L) 03/15/2023   HGB 13.5 03/15/2023   HCT 36.1 (L) 03/15/2023   MCV 81.9 03/15/2023   PLT 74 (L) 03/15/2023   NEUTROABS 1.7 03/15/2023    Imaging:  No results found.  Medications: I have reviewed the patient's current medications.  Assessment/Plan: Metastatic carcinoid tumor Left lower lobectomy February 2004 at [facility], 2.2 cm typical carcinoid tumor Laparoscopic cholecystectomy November 2014, imaging revealed a 4.4 cm segment 8 liver lesion 03/18/2013-central hepatectomy and drainage of intra-abdominal abscess,- low-grade neuroendocrine carcinoma, tumor appeared totally excised,Ki-67 less than 5% CT's 05/09/2014, 08/16/2016, 09/19/2017-no evidence of recurrent disease CTs 914 2020-3 new liver lesions 12/26/2018 PET dotatate scan-at least 20 liver lesions and extensive bone metastases, hypermetabolic pancreas tail lesion 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 01/16/2019-biopsy of the left hepatic lobe lesion-metastatic neuroendocrine carcinoma,Ki-67 10%- interpretation limited due to scant remaining tumor may not be representative of the overall lesion, mismatch repair protein staining intact 02/27/2019-Sandostatin 30 mg every 4 weeks 09/18/2019-  Netspot PET-increased activity in the pancreas tail and liver and bone metastases Late August 2021-everolimus, 7.5 mg daily, placed on hold September 2021 due to acute hyperglycemia/new onset diabetes 01/22/2020-everolimus resumed Netspot 09/29/2020-innumerable liver metastases, unchanged, stable tracer uptake in distal pancreas lesion, innumerable bone lesions, significant decrease in tracer uptake in left humeral lesion, mild decrease in FDG uptake in other bone lesions, no new site of metastatic disease Netspot 02/02/2022-no change from scan on 09/29/2020, multifocal hepatic and bone metastases.  Small tail of the pancreas lesion Sandostatin and everolimus continued Netspot 02/14/2023-essentially identical pattern of metastatic well-differentiated tumor compared to dotatate PET scan 02/02/2022.  No change in number, pattern or intensity of lesions.  No new lesions.  Stable small lesion in the tail of the pancreas. Diabetes ERCP removal of bile duct stones March 2015, March 2018 Sleep apnea Hypertension COVID-19 infection September 2021 Cholecystectomy November 2014 Umbilical hernia repair 03/18/2013 Erectile dysfunction Chronic thrombocytopenia  Disposition: Michael Good appears stable.  Recent restaging PET scan shows stable disease.  Plan to continue Afinitor and monthly Sandostatin.  He agrees with this plan.  CBC reviewed.  He has stable mild leukopenia and thrombocytopenia.  Counts are adequate to continue treatment as above.  Michael Good will return for an office visit in 3 months.    Lonna Cobb ANP/GNP-BC   04/12/2023  2:46 PM

## 2023-04-12 NOTE — Patient Instructions (Signed)
[facility]   Discharge Instructions: Thank you for choosing [facility] to provide your oncology and hematology care.   If you have a lab appointment with the Cancer Center, please go directly to the Cancer Center and check in at the registration area.   Wear comfortable clothing and clothing appropriate for easy access to any Portacath or PICC line.   We strive to give you quality time with your provider. You may need to reschedule your appointment if you arrive late (15 or more minutes).  Arriving late affects you and other patients whose appointments are after yours.  Also, if you miss three or more appointments without notifying the office, you may be dismissed from the clinic at the provider's discretion.      For prescription refill requests, have your pharmacy contact our office and allow 72 hours for refills to be completed.    Today you received the following chemotherapy and/or immunotherapy agents SANDOSTATIN      To help prevent nausea and vomiting after your treatment, we encourage you to take your nausea medication as directed.  BELOW ARE SYMPTOMS THAT SHOULD BE REPORTED IMMEDIATELY: *FEVER GREATER THAN 100.4 F (38 C) OR HIGHER *CHILLS OR SWEATING *NAUSEA AND VOMITING THAT IS NOT CONTROLLED WITH YOUR NAUSEA MEDICATION *UNUSUAL SHORTNESS OF BREATH *UNUSUAL BRUISING OR BLEEDING *URINARY PROBLEMS (pain or burning when urinating, or frequent urination) *BOWEL PROBLEMS (unusual diarrhea, constipation, pain near the anus) TENDERNESS IN MOUTH AND THROAT WITH OR WITHOUT PRESENCE OF ULCERS (sore throat, sores in mouth, or a toothache) UNUSUAL RASH, SWELLING OR PAIN  UNUSUAL VAGINAL DISCHARGE OR ITCHING   Items with * indicate a potential emergency and should be followed up as soon as possible or go to the Emergency Department if any problems should occur.  Please show the CHEMOTHERAPY ALERT CARD or  IMMUNOTHERAPY ALERT CARD at check-in to the Emergency Department and triage nurse.  Should you have questions after your visit or need to cancel or reschedule your appointment, please contact [facility] DE[facility]  Dept: [phone]  and follow the prompts.  Office hours are 8:00 a.m. to 4:30 p.m. Monday - Friday. Please note that voicemails left after 4:00 p.m. may not be returned until the following business day.  We are closed weekends and major holidays. You have access to a nurse at all times for urgent questions. Please call the main number to the clinic Dept: [phone] and follow the prompts.   For any non-urgent questions, you may also contact your provider using MyChart. We now offer e-Visits for anyone 38 and older to request care online for non-urgent symptoms. For details visit [email or web address].   Also download the MyChart app! Go to the app store, search "MyChart", open the app, select [place], and log in with your MyChart username and password.  Octreotide Injection Solution What is this medication? OCTREOTIDE (ok TREE oh tide) treats high levels of growth hormone (acromegaly). It works by reducing the amount of growth hormone your body makes. This reduces symptoms and the risk of health problems caused by too much growth hormone, such as diabetes and heart disease. It may also be used to treat diarrhea caused by neuroendocrine tumors. It works by slowing down the release of serotonin from the tumor cells. This reduces the number of bowel movements you have. This medicine may be used for other purposes; ask  your health care provider or pharmacist if you have questions. COMMON BRAND NAME(S): Berline Lopes, Sandostatin What should I tell my care team before I take this medication? They need to know if you have any of these conditions: Diabetes Gallbladder disease Heart disease Kidney disease Liver disease Pancreatic  disease Thyroid disease An unusual or allergic reaction to octreotide, other medications, foods, dyes, or preservatives Pregnant or trying to get pregnant Breastfeeding How should I use this medication? This medication is injected under the skin or into a vein. It is usually given by your care team in a hospital or clinic setting. If you get this medication at home, you will be taught how to prepare and give it. Use exactly as directed. Take it as directed on the prescription label at the same time every day. Keep taking it unless your care team tells you to stop. Allow the injection solution to come to room temperature before use. Do not warm it artificially. It is important that you put your used needles and syringes in a special sharps container. Do not put them in a trash can. If you do not have a sharps container, call your pharmacist or care team to get one. Talk to your care team about the use of this medication in children. Special care may be needed. Overdosage: If you think you have taken too much of this medicine contact a poison control center or emergency room at once. NOTE: This medicine is only for you. Do not share this medicine with others. What if I miss a dose? If you miss a dose, take it as soon as you can. If it is almost time for your next dose, take only that dose. Do not take double or extra doses. What may interact with this medication? Bromocriptine Certain medications for blood pressure, heart disease, irregular heartbeat Cyclosporine Diuretics Medications for diabetes, including insulin Quinidine This list may not describe all possible interactions. Give your health care provider a list of all the medicines, herbs, non-prescription drugs, or dietary supplements you use. Also tell them if you smoke, drink alcohol, or use illegal drugs. Some items may interact with your medicine. What should I watch for while using this medication? Visit your care team for regular  checks on your progress. Tell your care team if your symptoms do not start to get better or if they get worse. To help reduce irritation at the injection site, use a different site for each injection and make sure the solution is at room temperature before use. This medication may cause decreases in blood sugar. Signs of low blood sugar include chills, cool, pale skin or cold sweats, drowsiness, extreme hunger, fast heartbeat, headache, nausea, nervousness or anxiety, shakiness, trembling, unsteadiness, tiredness, or weakness. Contact your care team right away if you experience any of these symptoms. This medication may increase blood sugar. The risk may be higher in patients who already have diabetes. Ask your care team what you can do to lower your risk of diabetes while taking this medication. You should make sure you get enough vitamin B12 while you are taking this medication. Discuss the foods you eat and the vitamins you take with your care team. What side effects may I notice from receiving this medication? Side effects that you should report to your care team as soon as possible: Allergic reactions--skin rash, itching, hives, swelling of the face, lips, tongue, or throat Gallbladder problems--severe stomach pain, nausea, vomiting, fever Heart rhythm changes--fast or irregular heartbeat, dizziness, feeling faint or  lightheaded, chest pain, trouble breathing High blood sugar (hyperglycemia)--increased thirst or amount of urine, unusual weakness or fatigue, blurry vision Low blood sugar (hypoglycemia)--tremors or shaking, anxiety, sweating, cold or clammy skin, confusion, dizziness, rapid heartbeat Low thyroid levels (hypothyroidism)--unusual weakness or fatigue, increased sensitivity to cold, constipation, hair loss, dry skin, weight gain, feelings of depression Low vitamin B12 level--pain, tingling, or numbness in the hands or feet, muscle weakness, dizziness, confusion, trouble  concentrating Oily or light-colored stools, diarrhea, bloating, weight loss Pancreatitis--severe stomach pain that spreads to your back or gets worse after eating or when touched, fever, nausea, vomiting Slow heartbeat--dizziness, feeling faint or lightheaded, confusion, trouble breathing, unusual weakness or fatigue Side effects that usually do not require medical attention (report these to your care team if they continue or are bothersome): Diarrhea Dizziness Headache Nausea Pain, redness, or irritation at injection site Stomach pain This list may not describe all possible side effects. Call your doctor for medical advice about side effects. You may report side effects to FDA at 1-800-FDA-1088. Where should I keep my medication? Keep out of the reach of children and pets. Store in the refrigerator. Protect from light. Allow to come to room temperature naturally. Do not use artificial heat. If protected from light, the injection may be stored between 20 and 30 degrees C (70 and 86 degrees F) for 14 days. After the initial use, throw away any unused portion of a multiple dose vial after 14 days. Get rid of any unused portions of the ampules after use. To get rid of medications that are no longer needed or have expired: Take the medication to a medication take-back program. Ask your pharmacy or law enforcement to find a location. If you cannot return the medication, ask your pharmacist or care team how to get rid of the medication safely. NOTE: This sheet is a summary. It may not cover all possible information. If you have questions about this medicine, talk to your doctor, pharmacist, or health care provider.  2024 Elsevier/Gold Standard (2023-01-27 00:00:00)

## 2023-04-13 ENCOUNTER — Other Ambulatory Visit: Payer: Medicare Other

## 2023-04-13 ENCOUNTER — Other Ambulatory Visit: Payer: Self-pay

## 2023-04-13 ENCOUNTER — Ambulatory Visit: Payer: Medicare Other

## 2023-04-13 ENCOUNTER — Ambulatory Visit: Payer: Medicare Other | Admitting: Nurse Practitioner

## 2023-04-13 LAB — CHROMOGRANIN A: Chromogranin A (ng/mL): 1106 ng/mL — ABNORMAL HIGH (ref 0.0–101.8)

## 2023-04-18 ENCOUNTER — Other Ambulatory Visit: Payer: Self-pay

## 2023-04-25 ENCOUNTER — Other Ambulatory Visit: Payer: Self-pay | Admitting: Oncology

## 2023-04-25 ENCOUNTER — Other Ambulatory Visit (HOSPITAL_COMMUNITY): Payer: Self-pay

## 2023-04-25 ENCOUNTER — Other Ambulatory Visit: Payer: Self-pay

## 2023-04-25 DIAGNOSIS — C7A090 Malignant carcinoid tumor of the bronchus and lung: Secondary | ICD-10-CM

## 2023-04-25 MED ORDER — EVEROLIMUS 7.5 MG PO TABS
ORAL_TABLET | Freq: Every day | ORAL | 0 refills | Status: DC
  Filled 2023-04-25: qty 30, 30d supply, fill #0

## 2023-04-25 NOTE — Progress Notes (Signed)
 Specialty Pharmacy Refill Coordination Note  Michael Good. is a 68 y.o. male contacted today regarding refills of specialty medication(s) Everolimus [name])   Patient requested Delivery   Delivery date: 04/28/23   Verified address: [address] [place] [address]   Medication will be filled on 04/27/23.This fill date is pending response to refill request from provider. Patient is aware and if they have not received fill by intended date they must follow up with pharmacy.

## 2023-04-25 NOTE — Progress Notes (Signed)
 Specialty Pharmacy Ongoing Clinical Assessment Note  Favor Hackler. is a 68 y.o. male who is being followed by the specialty pharmacy service for RxSp Oncology   Patient's specialty medication(s) reviewed today: Everolimus (AFINITOR)   Missed doses in the last 4 weeks: 0   Patient/Caregiver did not have any additional questions or concerns.   Therapeutic benefit summary: Patient is achieving benefit   Adverse events/side effects summary: Experienced adverse events/side effects (loss of appetite)   Patient's therapy is appropriate to: Continue    Goals Addressed             This Visit's Progress    Slow Disease Progression       Patient is on track. Patient will maintain adherence.  Recent PET scan was documented to show stable disease (office visit 04/12/23).          Follow up:  6 months  Servando Snare Specialty Pharmacist

## 2023-04-27 ENCOUNTER — Other Ambulatory Visit: Payer: Self-pay

## 2023-05-10 ENCOUNTER — Inpatient Hospital Stay: Payer: Medicare Other

## 2023-05-10 ENCOUNTER — Ambulatory Visit: Payer: Medicare Other

## 2023-05-10 ENCOUNTER — Inpatient Hospital Stay: Payer: Medicare Other | Attending: Oncology

## 2023-05-10 ENCOUNTER — Other Ambulatory Visit: Payer: Medicare Other

## 2023-05-10 VITALS — BP 160/80 | HR 61 | Temp 98.3°F | Resp 18 | Ht 73.0 in | Wt 176.9 lb

## 2023-05-10 DIAGNOSIS — C7A090 Malignant carcinoid tumor of the bronchus and lung: Secondary | ICD-10-CM | POA: Diagnosis present

## 2023-05-10 DIAGNOSIS — C7A8 Other malignant neuroendocrine tumors: Secondary | ICD-10-CM

## 2023-05-10 DIAGNOSIS — C7B02 Secondary carcinoid tumors of liver: Secondary | ICD-10-CM | POA: Insufficient documentation

## 2023-05-10 DIAGNOSIS — C7B03 Secondary carcinoid tumors of bone: Secondary | ICD-10-CM | POA: Diagnosis present

## 2023-05-10 LAB — CBC WITH DIFFERENTIAL (CANCER CENTER ONLY)
Abs Immature Granulocytes: 0.01 10*3/uL (ref 0.00–0.07)
Basophils Absolute: 0 10*3/uL (ref 0.0–0.1)
Basophils Relative: 0 %
Eosinophils Absolute: 0 10*3/uL (ref 0.0–0.5)
Eosinophils Relative: 2 %
HCT: 32.8 % — ABNORMAL LOW (ref 39.0–52.0)
Hemoglobin: 12.3 g/dL — ABNORMAL LOW (ref 13.0–17.0)
Immature Granulocytes: 0 %
Lymphocytes Relative: 32 %
Lymphs Abs: 0.7 10*3/uL (ref 0.7–4.0)
MCH: 31.1 pg (ref 26.0–34.0)
MCHC: 37.5 g/dL — ABNORMAL HIGH (ref 30.0–36.0)
MCV: 82.8 fL (ref 80.0–100.0)
Monocytes Absolute: 0.2 10*3/uL (ref 0.1–1.0)
Monocytes Relative: 8 %
Neutro Abs: 1.3 10*3/uL — ABNORMAL LOW (ref 1.7–7.7)
Neutrophils Relative %: 58 %
Platelet Count: 69 10*3/uL — ABNORMAL LOW (ref 150–400)
RBC: 3.96 MIL/uL — ABNORMAL LOW (ref 4.22–5.81)
RDW: 13.1 % (ref 11.5–15.5)
WBC Count: 2.3 10*3/uL — ABNORMAL LOW (ref 4.0–10.5)
nRBC: 0 % (ref 0.0–0.2)

## 2023-05-10 MED ORDER — OCTREOTIDE ACETATE 30 MG IM KIT
30.0000 mg | PACK | Freq: Once | INTRAMUSCULAR | Status: AC
  Administered 2023-05-10: 30 mg via INTRAMUSCULAR
  Filled 2023-05-10: qty 1

## 2023-05-10 NOTE — Patient Instructions (Signed)
 [facility]   Discharge Instructions: Thank you for choosing  Cancer Center to provide your oncology and hematology care.   If you have a lab appointment with the Cancer Center, please go directly to the Cancer Center and check in at the registration area.   Wear comfortable clothing and clothing appropriate for easy access to any Portacath or PICC line.   We strive to give you quality time with your provider. You may need to reschedule your appointment if you arrive late (15 or more minutes).  Arriving late affects you and other patients whose appointments are after yours.  Also, if you miss three or more appointments without notifying the office, you may be dismissed from the clinic at the provider's discretion.      For prescription refill requests, have your pharmacy contact our office and allow 72 hours for refills to be completed.    Today you received the following chemotherapy and/or immunotherapy agents SANDOSTATIN      To help prevent nausea and vomiting after your treatment, we encourage you to take your nausea medication as directed.  Octreotide Injection Solution What is this medication? OCTREOTIDE (ok TREE oh tide) treats high levels of growth hormone (acromegaly). It works by reducing the amount of growth hormone your body makes. This reduces symptoms and the risk of health problems caused by too much growth hormone, such as diabetes and heart disease. It may also be used to treat diarrhea caused by neuroendocrine tumors. It works by slowing down the release of serotonin from the tumor cells. This reduces the number of bowel movements you have. This medicine may be used for other purposes; ask your health care provider or pharmacist if you have questions. COMMON BRAND NAME(S): Berline Lopes, Sandostatin What should I tell my care team before I take this medication? They need to know if you have any of these  conditions: Diabetes Gallbladder disease Heart disease Kidney disease Liver disease Pancreatic disease Thyroid disease An unusual or allergic reaction to octreotide, other medications, foods, dyes, or preservatives Pregnant or trying to get pregnant Breastfeeding How should I use this medication? This medication is injected under the skin or into a vein. It is usually given by your care team in a hospital or clinic setting. If you get this medication at home, you will be taught how to prepare and give it. Use exactly as directed. Take it as directed on the prescription label at the same time every day. Keep taking it unless your care team tells you to stop. Allow the injection solution to come to room temperature before use. Do not warm it artificially. It is important that you put your used needles and syringes in a special sharps container. Do not put them in a trash can. If you do not have a sharps container, call your pharmacist or care team to get one. Talk to your care team about the use of this medication in children. Special care may be needed. Overdosage: If you think you have taken too much of this medicine contact a poison control center or emergency room at once. NOTE: This medicine is only for you. Do not share this medicine with others. What if I miss a dose? If you miss a dose, take it as soon as you can. If it is almost time for your next dose, take only that dose. Do not take double or extra doses. What may interact with this medication?  Bromocriptine Certain medications for blood pressure, heart disease, irregular heartbeat Cyclosporine Diuretics Medications for diabetes, including insulin Quinidine This list may not describe all possible interactions. Give your health care provider a list of all the medicines, herbs, non-prescription drugs, or dietary supplements you use. Also tell them if you smoke, drink alcohol, or use illegal drugs. Some items may interact with your  medicine. What should I watch for while using this medication? Visit your care team for regular checks on your progress. Tell your care team if your symptoms do not start to get better or if they get worse. To help reduce irritation at the injection site, use a different site for each injection and make sure the solution is at room temperature before use. This medication may cause decreases in blood sugar. Signs of low blood sugar include chills, cool, pale skin or cold sweats, drowsiness, extreme hunger, fast heartbeat, headache, nausea, nervousness or anxiety, shakiness, trembling, unsteadiness, tiredness, or weakness. Contact your care team right away if you experience any of these symptoms. This medication may increase blood sugar. The risk may be higher in patients who already have diabetes. Ask your care team what you can do to lower your risk of diabetes while taking this medication. You should make sure you get enough vitamin B12 while you are taking this medication. Discuss the foods you eat and the vitamins you take with your care team. What side effects may I notice from receiving this medication? Side effects that you should report to your care team as soon as possible: Allergic reactions--skin rash, itching, hives, swelling of the face, lips, tongue, or throat Gallbladder problems--severe stomach pain, nausea, vomiting, fever Heart rhythm changes--fast or irregular heartbeat, dizziness, feeling faint or lightheaded, chest pain, trouble breathing High blood sugar (hyperglycemia)--increased thirst or amount of urine, unusual weakness or fatigue, blurry vision Low blood sugar (hypoglycemia)--tremors or shaking, anxiety, sweating, cold or clammy skin, confusion, dizziness, rapid heartbeat Low thyroid levels (hypothyroidism)--unusual weakness or fatigue, increased sensitivity to cold, constipation, hair loss, dry skin, weight gain, feelings of depression Low vitamin B12 level--pain, tingling,  or numbness in the hands or feet, muscle weakness, dizziness, confusion, trouble concentrating Oily or light-colored stools, diarrhea, bloating, weight loss Pancreatitis--severe stomach pain that spreads to your back or gets worse after eating or when touched, fever, nausea, vomiting Slow heartbeat--dizziness, feeling faint or lightheaded, confusion, trouble breathing, unusual weakness or fatigue Side effects that usually do not require medical attention (report these to your care team if they continue or are bothersome): Diarrhea Dizziness Headache Nausea Pain, redness, or irritation at injection site Stomach pain This list may not describe all possible side effects. Call your doctor for medical advice about side effects. You may report side effects to FDA at 1-800-FDA-1088. Where should I keep my medication? Keep out of the reach of children and pets. Store in the refrigerator. Protect from light. Allow to come to room temperature naturally. Do not use artificial heat. If protected from light, the injection may be stored between 20 and 30 degrees C (70 and 86 degrees F) for 14 days. After the initial use, throw away any unused portion of a multiple dose vial after 14 days. Get rid of any unused portions of the ampules after use. To get rid of medications that are no longer needed or have expired: Take the medication to a medication take-back program. Ask your pharmacy or law enforcement to find a location. If you cannot return the medication, ask your pharmacist or care  team how to get rid of the medication safely. NOTE: This sheet is a summary. It may not cover all possible information. If you have questions about this medicine, talk to your doctor, pharmacist, or health care provider.  2024 Elsevier/Gold Standard (2023-01-27 00:00:00) BELOW ARE SYMPTOMS THAT SHOULD BE REPORTED IMMEDIATELY: *FEVER GREATER THAN 100.4 F (38 C) OR HIGHER *CHILLS OR SWEATING *NAUSEA AND VOMITING THAT IS NOT  CONTROLLED WITH YOUR NAUSEA MEDICATION *UNUSUAL SHORTNESS OF BREATH *UNUSUAL BRUISING OR BLEEDING *URINARY PROBLEMS (pain or burning when urinating, or frequent urination) *BOWEL PROBLEMS (unusual diarrhea, constipation, pain near the anus) TENDERNESS IN MOUTH AND THROAT WITH OR WITHOUT PRESENCE OF ULCERS (sore throat, sores in mouth, or a toothache) UNUSUAL RASH, SWELLING OR PAIN  UNUSUAL VAGINAL DISCHARGE OR ITCHING   Items with * indicate a potential emergency and should be followed up as soon as possible or go to the Emergency Department if any problems should occur.  Please show the CHEMOTHERAPY ALERT CARD or IMMUNOTHERAPY ALERT CARD at check-in to the Emergency Department and triage nurse.  Should you have questions after your visit or need to cancel or reschedule your appointment, please contact [facility]  Dept: [phone]  and follow the prompts.  Office hours are 8:00 a.m. to 4:30 p.m. Monday - Friday. Please note that voicemails left after 4:00 p.m. may not be returned until the following business day.  We are closed weekends and major holidays. You have access to a nurse at all times for urgent questions. Please call the main number to the clinic Dept: [phone] and follow the prompts.   For any non-urgent questions, you may also contact your provider using MyChart. We now offer e-Visits for anyone 67 and older to request care online for non-urgent symptoms. For details visit [email or web address].   Also download the MyChart app! Go to the app store, search "MyChart", open the app, select [place], and log in with your MyChart username and password.

## 2023-05-15 ENCOUNTER — Other Ambulatory Visit: Payer: Self-pay

## 2023-05-17 ENCOUNTER — Other Ambulatory Visit: Payer: Self-pay

## 2023-05-22 ENCOUNTER — Other Ambulatory Visit: Payer: Self-pay

## 2023-05-22 ENCOUNTER — Other Ambulatory Visit: Payer: Self-pay | Admitting: Oncology

## 2023-05-22 DIAGNOSIS — C7A090 Malignant carcinoid tumor of the bronchus and lung: Secondary | ICD-10-CM

## 2023-05-22 MED ORDER — EVEROLIMUS 7.5 MG PO TABS
ORAL_TABLET | Freq: Every day | ORAL | 1 refills | Status: DC
  Filled 2023-05-22: qty 30, 30d supply, fill #0
  Filled 2023-06-21: qty 30, 30d supply, fill #1

## 2023-05-22 NOTE — Progress Notes (Signed)
 Specialty Pharmacy Refill Coordination Note  Michael Good. is a 68 y.o. male contacted today regarding refills of specialty medication(s) Everolimus [name])   Patient requested Delivery   Delivery date: 05/26/23   Verified address: [address]  [place] [address]   Medication will be filled on 05/25/23.   This fill date is pending response to refill request from provider. Patient is aware and if they have not received fill by intended date they must follow up with pharmacy.

## 2023-05-23 ENCOUNTER — Other Ambulatory Visit (HOSPITAL_COMMUNITY): Payer: Self-pay

## 2023-05-25 ENCOUNTER — Other Ambulatory Visit: Payer: Self-pay

## 2023-06-07 ENCOUNTER — Inpatient Hospital Stay: Payer: Medicare Other

## 2023-06-07 ENCOUNTER — Inpatient Hospital Stay: Payer: Medicare Other | Attending: Oncology

## 2023-06-07 ENCOUNTER — Other Ambulatory Visit: Payer: Medicare Other

## 2023-06-07 ENCOUNTER — Ambulatory Visit: Payer: Medicare Other

## 2023-06-07 VITALS — BP 145/71 | HR 68 | Temp 97.9°F | Resp 16 | Wt 175.1 lb

## 2023-06-07 DIAGNOSIS — C7A090 Malignant carcinoid tumor of the bronchus and lung: Secondary | ICD-10-CM | POA: Insufficient documentation

## 2023-06-07 DIAGNOSIS — C7B02 Secondary carcinoid tumors of liver: Secondary | ICD-10-CM | POA: Insufficient documentation

## 2023-06-07 DIAGNOSIS — C7B03 Secondary carcinoid tumors of bone: Secondary | ICD-10-CM | POA: Diagnosis present

## 2023-06-07 DIAGNOSIS — C7A8 Other malignant neuroendocrine tumors: Secondary | ICD-10-CM

## 2023-06-07 LAB — CBC WITH DIFFERENTIAL (CANCER CENTER ONLY)
Abs Immature Granulocytes: 0.01 10*3/uL (ref 0.00–0.07)
Basophils Absolute: 0 10*3/uL (ref 0.0–0.1)
Basophils Relative: 0 %
Eosinophils Absolute: 0.1 10*3/uL (ref 0.0–0.5)
Eosinophils Relative: 1 %
HCT: 34.1 % — ABNORMAL LOW (ref 39.0–52.0)
Hemoglobin: 12.7 g/dL — ABNORMAL LOW (ref 13.0–17.0)
Immature Granulocytes: 0 %
Lymphocytes Relative: 22 %
Lymphs Abs: 0.8 10*3/uL (ref 0.7–4.0)
MCH: 30.5 pg (ref 26.0–34.0)
MCHC: 37.2 g/dL — ABNORMAL HIGH (ref 30.0–36.0)
MCV: 81.8 fL (ref 80.0–100.0)
Monocytes Absolute: 0.3 10*3/uL (ref 0.1–1.0)
Monocytes Relative: 8 %
Neutro Abs: 2.5 10*3/uL (ref 1.7–7.7)
Neutrophils Relative %: 69 %
Platelet Count: 81 10*3/uL — ABNORMAL LOW (ref 150–400)
RBC: 4.17 MIL/uL — ABNORMAL LOW (ref 4.22–5.81)
RDW: 12.7 % (ref 11.5–15.5)
WBC Count: 3.7 10*3/uL — ABNORMAL LOW (ref 4.0–10.5)
nRBC: 0 % (ref 0.0–0.2)

## 2023-06-07 MED ORDER — OCTREOTIDE ACETATE 30 MG IM KIT
30.0000 mg | PACK | Freq: Once | INTRAMUSCULAR | Status: AC
Start: 2023-06-07 — End: 2023-06-07
  Administered 2023-06-07: 30 mg via INTRAMUSCULAR
  Filled 2023-06-07: qty 1

## 2023-06-07 NOTE — Progress Notes (Addendum)
 Patient stated having headaches that come and go, pain in multiple areas. Denied any SOB, N/V, diarrhea, presented with no pain, stable. Made Dr. Danielle Dess nurse aware.

## 2023-06-07 NOTE — Patient Instructions (Signed)
 [facility]  Discharge Instructions: Thank you for choosing [facility] to provide your oncology and hematology care.   If you have a lab appointment with the Cancer Center, please go directly to the Cancer Center and check in at the registration area.   Wear comfortable clothing and clothing appropriate for easy access to any Portacath or PICC line.   We strive to give you quality time with your provider. You may need to reschedule your appointment if you arrive late (15 or more minutes).  Arriving late affects you and other patients whose appointments are after yours.  Also, if you miss three or more appointments without notifying the office, you may be dismissed from the clinic at the provider's discretion.      For prescription refill requests, have your pharmacy contact our office and allow 72 hours for refills to be completed.    Today you received the following chemotherapy and/or immunotherapy agents Sandostatin       To help prevent nausea and vomiting after your treatment, we encourage you to take your nausea medication as directed.  BELOW ARE SYMPTOMS THAT SHOULD BE REPORTED IMMEDIATELY: *FEVER GREATER THAN 100.4 F (38 C) OR HIGHER *CHILLS OR SWEATING *NAUSEA AND VOMITING THAT IS NOT CONTROLLED WITH YOUR NAUSEA MEDICATION *UNUSUAL SHORTNESS OF BREATH *UNUSUAL BRUISING OR BLEEDING *URINARY PROBLEMS (pain or burning when urinating, or frequent urination) *BOWEL PROBLEMS (unusual diarrhea, constipation, pain near the anus) TENDERNESS IN MOUTH AND THROAT WITH OR WITHOUT PRESENCE OF ULCERS (sore throat, sores in mouth, or a toothache) UNUSUAL RASH, SWELLING OR PAIN  UNUSUAL VAGINAL DISCHARGE OR ITCHING   Items with * indicate a potential emergency and should be followed up as soon as possible or go to the Emergency Department if any problems should occur.  Please show the CHEMOTHERAPY ALERT CARD or  IMMUNOTHERAPY ALERT CARD at check-in to the Emergency Department and triage nurse.  Should you have questions after your visit or need to cancel or reschedule your appointment, please contact [facility]  Dept: [phone]  and follow the prompts.  Office hours are 8:00 a.m. to 4:30 p.m. Monday - Friday. Please note that voicemails left after 4:00 p.m. may not be returned until the following business day.  We are closed weekends and major holidays. You have access to a nurse at all times for urgent questions. Please call the main number to the clinic Dept: [phone] and follow the prompts.   For any non-urgent questions, you may also contact your provider using MyChart. We now offer e-Visits for anyone 72 and older to request care online for non-urgent symptoms. For details visit [email or web address].   Also download the MyChart app! Go to the app store, search "MyChart", open the app, select [place], and log in with your MyChart username and password.  Octreotide Injection Solution What is this medication? OCTREOTIDE (ok TREE oh tide) treats high levels of growth hormone (acromegaly). It works by reducing the amount of growth hormone your body makes. This reduces symptoms and the risk of health problems caused by too much growth hormone, such as diabetes and heart disease. It may also be used to treat diarrhea caused by neuroendocrine tumors. It works by slowing down the release of serotonin from the tumor cells. This reduces the number of bowel movements you have. This medicine may be used for other purposes; ask  your health care provider or pharmacist if you have questions. COMMON BRAND NAME(S): Berline Lopes, Sandostatin What should I tell my care team before I take this medication? They need to know if you have any of these conditions: Diabetes Gallbladder disease Heart disease Kidney disease Liver disease Pancreatic  disease Thyroid disease An unusual or allergic reaction to octreotide, other medications, foods, dyes, or preservatives Pregnant or trying to get pregnant Breastfeeding How should I use this medication? This medication is injected under the skin or into a vein. It is usually given by your care team in a hospital or clinic setting. If you get this medication at home, you will be taught how to prepare and give it. Use exactly as directed. Take it as directed on the prescription label at the same time every day. Keep taking it unless your care team tells you to stop. Allow the injection solution to come to room temperature before use. Do not warm it artificially. It is important that you put your used needles and syringes in a special sharps container. Do not put them in a trash can. If you do not have a sharps container, call your pharmacist or care team to get one. Talk to your care team about the use of this medication in children. Special care may be needed. Overdosage: If you think you have taken too much of this medicine contact a poison control center or emergency room at once. NOTE: This medicine is only for you. Do not share this medicine with others. What if I miss a dose? If you miss a dose, take it as soon as you can. If it is almost time for your next dose, take only that dose. Do not take double or extra doses. What may interact with this medication? Bromocriptine Certain medications for blood pressure, heart disease, irregular heartbeat Cyclosporine Diuretics Medications for diabetes, including insulin Quinidine This list may not describe all possible interactions. Give your health care provider a list of all the medicines, herbs, non-prescription drugs, or dietary supplements you use. Also tell them if you smoke, drink alcohol, or use illegal drugs. Some items may interact with your medicine. What should I watch for while using this medication? Visit your care team for regular  checks on your progress. Tell your care team if your symptoms do not start to get better or if they get worse. To help reduce irritation at the injection site, use a different site for each injection and make sure the solution is at room temperature before use. This medication may cause decreases in blood sugar. Signs of low blood sugar include chills, cool, pale skin or cold sweats, drowsiness, extreme hunger, fast heartbeat, headache, nausea, nervousness or anxiety, shakiness, trembling, unsteadiness, tiredness, or weakness. Contact your care team right away if you experience any of these symptoms. This medication may increase blood sugar. The risk may be higher in patients who already have diabetes. Ask your care team what you can do to lower your risk of diabetes while taking this medication. You should make sure you get enough vitamin B12 while you are taking this medication. Discuss the foods you eat and the vitamins you take with your care team. What side effects may I notice from receiving this medication? Side effects that you should report to your care team as soon as possible: Allergic reactions--skin rash, itching, hives, swelling of the face, lips, tongue, or throat Gallbladder problems--severe stomach pain, nausea, vomiting, fever Heart rhythm changes--fast or irregular heartbeat, dizziness, feeling faint or  lightheaded, chest pain, trouble breathing High blood sugar (hyperglycemia)--increased thirst or amount of urine, unusual weakness or fatigue, blurry vision Low blood sugar (hypoglycemia)--tremors or shaking, anxiety, sweating, cold or clammy skin, confusion, dizziness, rapid heartbeat Low thyroid levels (hypothyroidism)--unusual weakness or fatigue, increased sensitivity to cold, constipation, hair loss, dry skin, weight gain, feelings of depression Low vitamin B12 level--pain, tingling, or numbness in the hands or feet, muscle weakness, dizziness, confusion, trouble  concentrating Oily or light-colored stools, diarrhea, bloating, weight loss Pancreatitis--severe stomach pain that spreads to your back or gets worse after eating or when touched, fever, nausea, vomiting Slow heartbeat--dizziness, feeling faint or lightheaded, confusion, trouble breathing, unusual weakness or fatigue Side effects that usually do not require medical attention (report these to your care team if they continue or are bothersome): Diarrhea Dizziness Headache Nausea Pain, redness, or irritation at injection site Stomach pain This list may not describe all possible side effects. Call your doctor for medical advice about side effects. You may report side effects to FDA at 1-800-FDA-1088. Where should I keep my medication? Keep out of the reach of children and pets. Store in the refrigerator. Protect from light. Allow to come to room temperature naturally. Do not use artificial heat. If protected from light, the injection may be stored between 20 and 30 degrees C (70 and 86 degrees F) for 14 days. After the initial use, throw away any unused portion of a multiple dose vial after 14 days. Get rid of any unused portions of the ampules after use. To get rid of medications that are no longer needed or have expired: Take the medication to a medication take-back program. Ask your pharmacy or law enforcement to find a location. If you cannot return the medication, ask your pharmacist or care team how to get rid of the medication safely. NOTE: This sheet is a summary. It may not cover all possible information. If you have questions about this medicine, talk to your doctor, pharmacist, or health care provider.  2024 Elsevier/Gold Standard (2023-01-27 00:00:00)

## 2023-06-08 ENCOUNTER — Telehealth: Payer: Self-pay | Admitting: *Deleted

## 2023-06-08 NOTE — Telephone Encounter (Signed)
 Michael Good agrees to new appointment on 5/5 at 2:15 for lab/OV/injection. Also requested RN send MyChart message.

## 2023-06-19 ENCOUNTER — Other Ambulatory Visit: Payer: Self-pay

## 2023-06-21 ENCOUNTER — Other Ambulatory Visit: Payer: Self-pay

## 2023-06-21 NOTE — Progress Notes (Signed)
 Specialty Pharmacy Refill Coordination Note  Michael Good. is a 68 y.o. male contacted today regarding refills of specialty medication(s) Everolimus  (AFINITOR )   Patient requested Delivery   Delivery date: 07/03/23   Verified address: [address]  [place] [address]   Medication will be filled on 06/30/23.

## 2023-06-30 ENCOUNTER — Other Ambulatory Visit: Payer: Self-pay

## 2023-07-03 ENCOUNTER — Inpatient Hospital Stay

## 2023-07-03 ENCOUNTER — Inpatient Hospital Stay: Attending: Oncology

## 2023-07-03 ENCOUNTER — Inpatient Hospital Stay (HOSPITAL_BASED_OUTPATIENT_CLINIC_OR_DEPARTMENT_OTHER): Admitting: Oncology

## 2023-07-03 VITALS — BP 138/74 | HR 70 | Temp 98.1°F | Resp 17 | Wt 175.2 lb

## 2023-07-03 DIAGNOSIS — R197 Diarrhea, unspecified: Secondary | ICD-10-CM | POA: Insufficient documentation

## 2023-07-03 DIAGNOSIS — C7A090 Malignant carcinoid tumor of the bronchus and lung: Secondary | ICD-10-CM | POA: Insufficient documentation

## 2023-07-03 DIAGNOSIS — C7B03 Secondary carcinoid tumors of bone: Secondary | ICD-10-CM | POA: Diagnosis present

## 2023-07-03 DIAGNOSIS — G4730 Sleep apnea, unspecified: Secondary | ICD-10-CM | POA: Insufficient documentation

## 2023-07-03 DIAGNOSIS — E1165 Type 2 diabetes mellitus with hyperglycemia: Secondary | ICD-10-CM | POA: Insufficient documentation

## 2023-07-03 DIAGNOSIS — I10 Essential (primary) hypertension: Secondary | ICD-10-CM | POA: Diagnosis not present

## 2023-07-03 DIAGNOSIS — Z79899 Other long term (current) drug therapy: Secondary | ICD-10-CM | POA: Diagnosis not present

## 2023-07-03 DIAGNOSIS — N529 Male erectile dysfunction, unspecified: Secondary | ICD-10-CM | POA: Insufficient documentation

## 2023-07-03 DIAGNOSIS — C7A8 Other malignant neuroendocrine tumors: Secondary | ICD-10-CM

## 2023-07-03 DIAGNOSIS — D696 Thrombocytopenia, unspecified: Secondary | ICD-10-CM | POA: Insufficient documentation

## 2023-07-03 DIAGNOSIS — C7B02 Secondary carcinoid tumors of liver: Secondary | ICD-10-CM | POA: Insufficient documentation

## 2023-07-03 LAB — CBC WITH DIFFERENTIAL (CANCER CENTER ONLY)
Abs Immature Granulocytes: 0.02 10*3/uL (ref 0.00–0.07)
Basophils Absolute: 0 10*3/uL (ref 0.0–0.1)
Basophils Relative: 1 %
Eosinophils Absolute: 0.1 10*3/uL (ref 0.0–0.5)
Eosinophils Relative: 1 %
HCT: 32.8 % — ABNORMAL LOW (ref 39.0–52.0)
Hemoglobin: 12.2 g/dL — ABNORMAL LOW (ref 13.0–17.0)
Immature Granulocytes: 1 %
Lymphocytes Relative: 22 %
Lymphs Abs: 0.8 10*3/uL (ref 0.7–4.0)
MCH: 30.7 pg (ref 26.0–34.0)
MCHC: 37.2 g/dL — ABNORMAL HIGH (ref 30.0–36.0)
MCV: 82.4 fL (ref 80.0–100.0)
Monocytes Absolute: 0.2 10*3/uL (ref 0.1–1.0)
Monocytes Relative: 6 %
Neutro Abs: 2.5 10*3/uL (ref 1.7–7.7)
Neutrophils Relative %: 69 %
Platelet Count: 84 10*3/uL — ABNORMAL LOW (ref 150–400)
RBC: 3.98 MIL/uL — ABNORMAL LOW (ref 4.22–5.81)
RDW: 13.2 % (ref 11.5–15.5)
WBC Count: 3.6 10*3/uL — ABNORMAL LOW (ref 4.0–10.5)
nRBC: 0 % (ref 0.0–0.2)

## 2023-07-03 LAB — CMP (CANCER CENTER ONLY)
ALT: 33 U/L (ref 0–44)
AST: 33 U/L (ref 15–41)
Albumin: 4.4 g/dL (ref 3.5–5.0)
Alkaline Phosphatase: 155 U/L — ABNORMAL HIGH (ref 38–126)
Anion gap: 10 (ref 5–15)
BUN: 14 mg/dL (ref 8–23)
CO2: 26 mmol/L (ref 22–32)
Calcium: 9.1 mg/dL (ref 8.9–10.3)
Chloride: 101 mmol/L (ref 98–111)
Creatinine: 1.08 mg/dL (ref 0.61–1.24)
GFR, Estimated: >60 mL/min (ref >60)
Glucose, Bld: 384 mg/dL — ABNORMAL HIGH (ref 70–99)
Potassium: 4.1 mmol/L (ref 3.5–5.1)
Sodium: 138 mmol/L (ref 135–145)
Total Bilirubin: 0.9 mg/dL (ref 0.0–1.2)
Total Protein: 6.7 g/dL (ref 6.5–8.1)

## 2023-07-03 MED ORDER — OCTREOTIDE ACETATE 30 MG IM KIT
30.0000 mg | PACK | Freq: Once | INTRAMUSCULAR | Status: AC
  Administered 2023-07-03: 30 mg via INTRAMUSCULAR

## 2023-07-03 NOTE — Progress Notes (Signed)
 [facility] OFFICE PROGRESS NOTE   Diagnosis: Metastatic carcinoid tumor  INTERVAL HISTORY:   Michael Good returns as scheduled.  He continues everolimus .  He has a poor appetite.  He continues to work.  He had discomfort at the right low posterolateral chest wall last week.  This has resolved.  He has occasional diarrhea, most commonly after the Sandostatin  injection.  Objective:  Vital signs in last 24 hours:  Blood pressure 138/74, pulse 70, temperature 98.1 F (36.7 C), temperature source Temporal, resp. rate 17, weight 175 lb 3.2 oz (79.5 kg), SpO2 100%.    HEENT: No thrush, few areas of erythema at the buccal mucosa Lymphatics: No cervical, supraclavicular, axillary, or inguinal nodes Resp: Lungs clear bilaterally Cardio: Regular rate and rhythm GI: No hepatosplenomegaly, no mass, nontender Vascular: No leg edema Musculoskeletal: No tenderness or mass at the right posterolateral chest wall   Lab Results:  Lab Results  Component Value Date   WBC 3.6 (L) 07/03/2023   HGB 12.2 (L) 07/03/2023   HCT 32.8 (L) 07/03/2023   MCV 82.4 07/03/2023   PLT 84 (L) 07/03/2023   NEUTROABS 2.5 07/03/2023    CMP  Lab Results  Component Value Date   NA 138 07/03/2023   K 4.1 07/03/2023   CL 101 07/03/2023   CO2 26 07/03/2023   GLUCOSE 384 (H) 07/03/2023   BUN 14 07/03/2023   CREATININE 1.08 07/03/2023   CALCIUM 9.1 07/03/2023   PROT 6.7 07/03/2023   ALBUMIN 4.4 07/03/2023   AST 33 07/03/2023   ALT 33 07/03/2023   ALKPHOS 155 (H) 07/03/2023   BILITOT 0.9 07/03/2023   GFRNONAA >60 07/03/2023    Lab Results  Component Value Date   CEA 2.77 07/19/2022     Medications: I have reviewed the patient's current medications.   Assessment/Plan: Metastatic carcinoid tumor Left lower lobectomy February 2004 at [facility], 2.2 cm typical carcinoid tumor Laparoscopic cholecystectomy November 2014, imaging revealed a 4.4 cm segment 8 liver lesion 03/18/2013-central  hepatectomy and drainage of intra-abdominal abscess,- low-grade neuroendocrine carcinoma, tumor appeared totally excised,Ki-67 less than 5% CT's 05/09/2014, 08/16/2016, 09/19/2017-no evidence of recurrent disease CTs 914 2020-3 new liver lesions 12/26/2018 PET dotatate scan-at least 20 liver lesions and extensive bone metastases, hypermetabolic pancreas tail lesion 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 01/16/2019-biopsy of the left hepatic lobe lesion-metastatic neuroendocrine carcinoma,Ki-67 10%- interpretation limited due to scant remaining tumor may not be representative of the overall lesion, mismatch repair protein staining intact 02/27/2019-Sandostatin  30 mg every 4 weeks 09/18/2019- Netspot  PET-increased activity in the pancreas tail and liver and bone metastases Late August 2021-everolimus , 7.5 mg daily, placed on hold September 2021 due to acute hyperglycemia/new onset diabetes 01/22/2020-everolimus  resumed Netspot  09/29/2020-innumerable liver metastases, unchanged, stable tracer uptake in distal pancreas lesion, innumerable bone lesions, significant decrease in tracer uptake in left humeral lesion, mild decrease in FDG uptake in other bone lesions, no new site of metastatic disease Netspot  02/02/2022-no change from scan on 09/29/2020, multifocal hepatic and bone metastases.  Small tail of the pancreas lesion Sandostatin  and everolimus  continued Netspot  02/14/2023-essentially identical pattern of metastatic well-differentiated tumor compared to dotatate PET scan 02/02/2022.  No change in number, pattern or intensity of lesions.  No new lesions.  Stable small lesion in the tail of the pancreas. Diabetes ERCP removal of bile duct stones March 2015, March 2018 Sleep apnea Hypertension COVID-19 infection September 2021 Cholecystectomy November 2014 Umbilical hernia repair 03/18/2013 Erectile dysfunction Chronic thrombocytopenia    Disposition: Mr.  Good appears stable.  He is the  everolimus  well.  The restaging dotatate PET on 02/14/2023 was stable.  I reviewed the images with him today.  The chromogranin A level has not changed significantly over the past year.  He will continue everolimus .Michael Good  He will continue monthly Sandostatin .  I encouraged him to follow-up with his primary provider for management of diabetes.  The blood sugar is consistently elevated when he is seen here. He will return for a monthly lab visit and Sandostatin .  He will be scheduled for a 29-month office visit.  We will plan for a restaging dotatate PET in December.  Coni Deep, MD  07/03/2023  3:56 PM

## 2023-07-05 ENCOUNTER — Ambulatory Visit: Payer: Medicare Other

## 2023-07-05 ENCOUNTER — Telehealth: Payer: Self-pay | Admitting: *Deleted

## 2023-07-05 ENCOUNTER — Other Ambulatory Visit: Payer: Self-pay | Admitting: *Deleted

## 2023-07-05 ENCOUNTER — Ambulatory Visit: Payer: Medicare Other | Admitting: Oncology

## 2023-07-05 ENCOUNTER — Other Ambulatory Visit: Payer: Medicare Other

## 2023-07-05 ENCOUNTER — Ambulatory Visit: Payer: Medicare Other | Admitting: Nurse Practitioner

## 2023-07-05 NOTE — Telephone Encounter (Signed)
 Michael Good had called yesterday to inquire if OK for him to start the Xyosted injection that another provider ordered for him. OK per Dr. Yasmin Helling patient via VM.

## 2023-07-06 ENCOUNTER — Ambulatory Visit

## 2023-07-06 ENCOUNTER — Other Ambulatory Visit

## 2023-07-06 ENCOUNTER — Ambulatory Visit: Admitting: Oncology

## 2023-07-12 ENCOUNTER — Other Ambulatory Visit: Payer: Medicare Other

## 2023-07-12 ENCOUNTER — Ambulatory Visit: Payer: Medicare Other | Admitting: Nurse Practitioner

## 2023-07-12 ENCOUNTER — Ambulatory Visit: Payer: Medicare Other

## 2023-07-13 ENCOUNTER — Ambulatory Visit: Payer: Medicare Other | Admitting: Oncology

## 2023-07-13 ENCOUNTER — Ambulatory Visit: Payer: Medicare Other

## 2023-07-13 ENCOUNTER — Other Ambulatory Visit: Payer: Medicare Other

## 2023-07-28 ENCOUNTER — Other Ambulatory Visit: Payer: Self-pay | Admitting: Oncology

## 2023-07-28 ENCOUNTER — Other Ambulatory Visit: Payer: Self-pay | Admitting: Pharmacy Technician

## 2023-07-28 ENCOUNTER — Other Ambulatory Visit: Payer: Self-pay

## 2023-07-28 DIAGNOSIS — C7A090 Malignant carcinoid tumor of the bronchus and lung: Secondary | ICD-10-CM

## 2023-07-28 MED ORDER — EVEROLIMUS 7.5 MG PO TABS
ORAL_TABLET | Freq: Every day | ORAL | 3 refills | Status: DC
  Filled 2023-07-28: qty 30, 30d supply, fill #0
  Filled 2023-08-25: qty 30, 30d supply, fill #1
  Filled 2023-09-25: qty 30, 30d supply, fill #2
  Filled 2023-10-31: qty 30, 30d supply, fill #3

## 2023-07-28 NOTE — Progress Notes (Signed)
 Specialty Pharmacy Refill Coordination Note  Brenen Beigel. is a 67 y.o. male contacted today regarding refills of specialty medication(s) Everolimus  (AFINITOR )   Patient requested Delivery   Delivery date: 08/03/23   Verified address: [address] [place] [address]   Medication will be filled on 08/02/23.   This fill date is pending response to refill request from provider. Patient is aware and if they have not received fill by intended date they must follow up with pharmacy.

## 2023-07-31 ENCOUNTER — Other Ambulatory Visit (HOSPITAL_COMMUNITY): Payer: Self-pay

## 2023-08-02 ENCOUNTER — Other Ambulatory Visit: Payer: Self-pay

## 2023-08-02 ENCOUNTER — Inpatient Hospital Stay: Attending: Oncology

## 2023-08-02 ENCOUNTER — Inpatient Hospital Stay

## 2023-08-02 VITALS — BP 156/83 | HR 62 | Temp 98.3°F | Resp 17 | Ht 73.0 in | Wt 179.5 lb

## 2023-08-02 DIAGNOSIS — C7A090 Malignant carcinoid tumor of the bronchus and lung: Secondary | ICD-10-CM | POA: Diagnosis present

## 2023-08-02 DIAGNOSIS — C7B03 Secondary carcinoid tumors of bone: Secondary | ICD-10-CM | POA: Insufficient documentation

## 2023-08-02 DIAGNOSIS — R197 Diarrhea, unspecified: Secondary | ICD-10-CM | POA: Diagnosis not present

## 2023-08-02 DIAGNOSIS — C7B02 Secondary carcinoid tumors of liver: Secondary | ICD-10-CM | POA: Diagnosis present

## 2023-08-02 LAB — CMP (CANCER CENTER ONLY)
ALT: 32 U/L (ref 0–44)
AST: 30 U/L (ref 15–41)
Albumin: 4.3 g/dL (ref 3.5–5.0)
Alkaline Phosphatase: 146 U/L — ABNORMAL HIGH (ref 38–126)
Anion gap: 12 (ref 5–15)
BUN: 16 mg/dL (ref 8–23)
CO2: 25 mmol/L (ref 22–32)
Calcium: 9.5 mg/dL (ref 8.9–10.3)
Chloride: 100 mmol/L (ref 98–111)
Creatinine: 1.15 mg/dL (ref 0.61–1.24)
GFR, Estimated: >60 mL/min (ref >60)
Glucose, Bld: 380 mg/dL — ABNORMAL HIGH (ref 70–99)
Potassium: 4.2 mmol/L (ref 3.5–5.1)
Sodium: 138 mmol/L (ref 135–145)
Total Bilirubin: 1 mg/dL (ref 0.0–1.2)
Total Protein: 7.2 g/dL (ref 6.5–8.1)

## 2023-08-02 LAB — CBC WITH DIFFERENTIAL (CANCER CENTER ONLY)
Abs Immature Granulocytes: 0.01 10*3/uL (ref 0.00–0.07)
Basophils Absolute: 0 10*3/uL (ref 0.0–0.1)
Basophils Relative: 0 %
Eosinophils Absolute: 0.1 10*3/uL (ref 0.0–0.5)
Eosinophils Relative: 3 %
HCT: 35 % — ABNORMAL LOW (ref 39.0–52.0)
Hemoglobin: 12.9 g/dL — ABNORMAL LOW (ref 13.0–17.0)
Immature Granulocytes: 0 %
Lymphocytes Relative: 24 %
Lymphs Abs: 0.7 10*3/uL (ref 0.7–4.0)
MCH: 30.5 pg (ref 26.0–34.0)
MCHC: 36.9 g/dL — ABNORMAL HIGH (ref 30.0–36.0)
MCV: 82.7 fL (ref 80.0–100.0)
Monocytes Absolute: 0.2 10*3/uL (ref 0.1–1.0)
Monocytes Relative: 7 %
Neutro Abs: 1.8 10*3/uL (ref 1.7–7.7)
Neutrophils Relative %: 66 %
Platelet Count: 82 10*3/uL — ABNORMAL LOW (ref 150–400)
RBC: 4.23 MIL/uL (ref 4.22–5.81)
RDW: 12.8 % (ref 11.5–15.5)
WBC Count: 2.8 10*3/uL — ABNORMAL LOW (ref 4.0–10.5)
nRBC: 0 % (ref 0.0–0.2)

## 2023-08-02 MED ORDER — OCTREOTIDE ACETATE 30 MG IM KIT
30.0000 mg | PACK | Freq: Once | INTRAMUSCULAR | Status: AC
  Administered 2023-08-02: 30 mg via INTRAMUSCULAR
  Filled 2023-08-02: qty 1

## 2023-08-02 NOTE — Patient Instructions (Signed)
 [facility]  Discharge Instructions: Thank you for choosing [facility] to provide your oncology and hematology care.   If you have a lab appointment with the Cancer Center, please go directly to the Cancer Center and check in at the registration area.   Wear comfortable clothing and clothing appropriate for easy access to any Portacath or PICC line.   We strive to give you quality time with your provider. You may need to reschedule your appointment if you arrive late (15 or more minutes).  Arriving late affects you and other patients whose appointments are after yours.  Also, if you miss three or more appointments without notifying the office, you may be dismissed from the clinic at the provider's discretion.      For prescription refill requests, have your pharmacy contact our office and allow 72 hours for refills to be completed.    Today you received the following chemotherapy and/or immunotherapy agents Sandostatin       To help prevent nausea and vomiting after your treatment, we encourage you to take your nausea medication as directed.  BELOW ARE SYMPTOMS THAT SHOULD BE REPORTED IMMEDIATELY: *FEVER GREATER THAN 100.4 F (38 C) OR HIGHER *CHILLS OR SWEATING *NAUSEA AND VOMITING THAT IS NOT CONTROLLED WITH YOUR NAUSEA MEDICATION *UNUSUAL SHORTNESS OF BREATH *UNUSUAL BRUISING OR BLEEDING *URINARY PROBLEMS (pain or burning when urinating, or frequent urination) *BOWEL PROBLEMS (unusual diarrhea, constipation, pain near the anus) TENDERNESS IN MOUTH AND THROAT WITH OR WITHOUT PRESENCE OF ULCERS (sore throat, sores in mouth, or a toothache) UNUSUAL RASH, SWELLING OR PAIN  UNUSUAL VAGINAL DISCHARGE OR ITCHING   Items with * indicate a potential emergency and should be followed up as soon as possible or go to the Emergency Department if any problems should occur.  Please show the CHEMOTHERAPY ALERT CARD or  IMMUNOTHERAPY ALERT CARD at check-in to the Emergency Department and triage nurse.  Should you have questions after your visit or need to cancel or reschedule your appointment, please contact [facility]  Dept: [phone]  and follow the prompts.  Office hours are 8:00 a.m. to 4:30 p.m. Monday - Friday. Please note that voicemails left after 4:00 p.m. may not be returned until the following business day.  We are closed weekends and major holidays. You have access to a nurse at all times for urgent questions. Please call the main number to the clinic Dept: [phone] and follow the prompts.   For any non-urgent questions, you may also contact your provider using MyChart. We now offer e-Visits for anyone 72 and older to request care online for non-urgent symptoms. For details visit [email or web address].   Also download the MyChart app! Go to the app store, search "MyChart", open the app, select [place], and log in with your MyChart username and password.  Octreotide Injection Solution What is this medication? OCTREOTIDE (ok TREE oh tide) treats high levels of growth hormone (acromegaly). It works by reducing the amount of growth hormone your body makes. This reduces symptoms and the risk of health problems caused by too much growth hormone, such as diabetes and heart disease. It may also be used to treat diarrhea caused by neuroendocrine tumors. It works by slowing down the release of serotonin from the tumor cells. This reduces the number of bowel movements you have. This medicine may be used for other purposes; ask  your health care provider or pharmacist if you have questions. COMMON BRAND NAME(S): Berline Lopes, Sandostatin What should I tell my care team before I take this medication? They need to know if you have any of these conditions: Diabetes Gallbladder disease Heart disease Kidney disease Liver disease Pancreatic  disease Thyroid disease An unusual or allergic reaction to octreotide, other medications, foods, dyes, or preservatives Pregnant or trying to get pregnant Breastfeeding How should I use this medication? This medication is injected under the skin or into a vein. It is usually given by your care team in a hospital or clinic setting. If you get this medication at home, you will be taught how to prepare and give it. Use exactly as directed. Take it as directed on the prescription label at the same time every day. Keep taking it unless your care team tells you to stop. Allow the injection solution to come to room temperature before use. Do not warm it artificially. It is important that you put your used needles and syringes in a special sharps container. Do not put them in a trash can. If you do not have a sharps container, call your pharmacist or care team to get one. Talk to your care team about the use of this medication in children. Special care may be needed. Overdosage: If you think you have taken too much of this medicine contact a poison control center or emergency room at once. NOTE: This medicine is only for you. Do not share this medicine with others. What if I miss a dose? If you miss a dose, take it as soon as you can. If it is almost time for your next dose, take only that dose. Do not take double or extra doses. What may interact with this medication? Bromocriptine Certain medications for blood pressure, heart disease, irregular heartbeat Cyclosporine Diuretics Medications for diabetes, including insulin Quinidine This list may not describe all possible interactions. Give your health care provider a list of all the medicines, herbs, non-prescription drugs, or dietary supplements you use. Also tell them if you smoke, drink alcohol, or use illegal drugs. Some items may interact with your medicine. What should I watch for while using this medication? Visit your care team for regular  checks on your progress. Tell your care team if your symptoms do not start to get better or if they get worse. To help reduce irritation at the injection site, use a different site for each injection and make sure the solution is at room temperature before use. This medication may cause decreases in blood sugar. Signs of low blood sugar include chills, cool, pale skin or cold sweats, drowsiness, extreme hunger, fast heartbeat, headache, nausea, nervousness or anxiety, shakiness, trembling, unsteadiness, tiredness, or weakness. Contact your care team right away if you experience any of these symptoms. This medication may increase blood sugar. The risk may be higher in patients who already have diabetes. Ask your care team what you can do to lower your risk of diabetes while taking this medication. You should make sure you get enough vitamin B12 while you are taking this medication. Discuss the foods you eat and the vitamins you take with your care team. What side effects may I notice from receiving this medication? Side effects that you should report to your care team as soon as possible: Allergic reactions--skin rash, itching, hives, swelling of the face, lips, tongue, or throat Gallbladder problems--severe stomach pain, nausea, vomiting, fever Heart rhythm changes--fast or irregular heartbeat, dizziness, feeling faint or  lightheaded, chest pain, trouble breathing High blood sugar (hyperglycemia)--increased thirst or amount of urine, unusual weakness or fatigue, blurry vision Low blood sugar (hypoglycemia)--tremors or shaking, anxiety, sweating, cold or clammy skin, confusion, dizziness, rapid heartbeat Low thyroid levels (hypothyroidism)--unusual weakness or fatigue, increased sensitivity to cold, constipation, hair loss, dry skin, weight gain, feelings of depression Low vitamin B12 level--pain, tingling, or numbness in the hands or feet, muscle weakness, dizziness, confusion, trouble  concentrating Oily or light-colored stools, diarrhea, bloating, weight loss Pancreatitis--severe stomach pain that spreads to your back or gets worse after eating or when touched, fever, nausea, vomiting Slow heartbeat--dizziness, feeling faint or lightheaded, confusion, trouble breathing, unusual weakness or fatigue Side effects that usually do not require medical attention (report these to your care team if they continue or are bothersome): Diarrhea Dizziness Headache Nausea Pain, redness, or irritation at injection site Stomach pain This list may not describe all possible side effects. Call your doctor for medical advice about side effects. You may report side effects to FDA at 1-800-FDA-1088. Where should I keep my medication? Keep out of the reach of children and pets. Store in the refrigerator. Protect from light. Allow to come to room temperature naturally. Do not use artificial heat. If protected from light, the injection may be stored between 20 and 30 degrees C (70 and 86 degrees F) for 14 days. After the initial use, throw away any unused portion of a multiple dose vial after 14 days. Get rid of any unused portions of the ampules after use. To get rid of medications that are no longer needed or have expired: Take the medication to a medication take-back program. Ask your pharmacy or law enforcement to find a location. If you cannot return the medication, ask your pharmacist or care team how to get rid of the medication safely. NOTE: This sheet is a summary. It may not cover all possible information. If you have questions about this medicine, talk to your doctor, pharmacist, or health care provider.  2024 Elsevier/Gold Standard (2023-01-27 00:00:00)

## 2023-08-25 ENCOUNTER — Other Ambulatory Visit (HOSPITAL_COMMUNITY): Payer: Self-pay

## 2023-08-25 ENCOUNTER — Other Ambulatory Visit: Payer: Self-pay

## 2023-08-25 NOTE — Progress Notes (Signed)
 Specialty Pharmacy Refill Coordination Note  Spoke with Michael Good. (Self).   Michael Good. is a 68 y.o. male contacted today regarding refills of specialty medication(s) Everolimus  (AFINITOR )  Patient requested: Delivery   Delivery date: 08/30/23   Verified address: [address]  [place] [address]  Medication will be filled on 08/29/23.

## 2023-08-29 ENCOUNTER — Other Ambulatory Visit: Payer: Self-pay

## 2023-08-30 ENCOUNTER — Other Ambulatory Visit

## 2023-08-30 ENCOUNTER — Ambulatory Visit

## 2023-08-31 ENCOUNTER — Telehealth: Payer: Self-pay

## 2023-08-31 ENCOUNTER — Inpatient Hospital Stay

## 2023-08-31 ENCOUNTER — Inpatient Hospital Stay: Attending: Oncology

## 2023-08-31 ENCOUNTER — Ambulatory Visit: Payer: Self-pay | Admitting: Oncology

## 2023-08-31 VITALS — BP 165/77 | HR 72 | Temp 98.4°F | Resp 18

## 2023-08-31 DIAGNOSIS — C7B02 Secondary carcinoid tumors of liver: Secondary | ICD-10-CM | POA: Insufficient documentation

## 2023-08-31 DIAGNOSIS — C7A090 Malignant carcinoid tumor of the bronchus and lung: Secondary | ICD-10-CM | POA: Diagnosis present

## 2023-08-31 DIAGNOSIS — C7B03 Secondary carcinoid tumors of bone: Secondary | ICD-10-CM | POA: Diagnosis present

## 2023-08-31 LAB — CMP (CANCER CENTER ONLY)
ALT: 38 U/L (ref 0–44)
AST: 28 U/L (ref 15–41)
Albumin: 4.1 g/dL (ref 3.5–5.0)
Alkaline Phosphatase: 151 U/L — ABNORMAL HIGH (ref 38–126)
Anion gap: 9 (ref 5–15)
BUN: 16 mg/dL (ref 8–23)
CO2: 23 mmol/L (ref 22–32)
Calcium: 8.9 mg/dL (ref 8.9–10.3)
Chloride: 100 mmol/L (ref 98–111)
Creatinine: 1.28 mg/dL — ABNORMAL HIGH (ref 0.61–1.24)
GFR, Estimated: >60 mL/min (ref >60)
Glucose, Bld: 522 mg/dL (ref 70–99)
Potassium: 4.2 mmol/L (ref 3.5–5.1)
Sodium: 132 mmol/L — ABNORMAL LOW (ref 135–145)
Total Bilirubin: 1.1 mg/dL (ref 0.0–1.2)
Total Protein: 6.6 g/dL (ref 6.5–8.1)

## 2023-08-31 LAB — CBC WITH DIFFERENTIAL (CANCER CENTER ONLY)
Abs Immature Granulocytes: 0.01 10*3/uL (ref 0.00–0.07)
Basophils Absolute: 0 10*3/uL (ref 0.0–0.1)
Basophils Relative: 0 %
Eosinophils Absolute: 0.1 10*3/uL (ref 0.0–0.5)
Eosinophils Relative: 2 %
HCT: 33.7 % — ABNORMAL LOW (ref 39.0–52.0)
Hemoglobin: 12.6 g/dL — ABNORMAL LOW (ref 13.0–17.0)
Immature Granulocytes: 0 %
Lymphocytes Relative: 17 %
Lymphs Abs: 0.6 10*3/uL — ABNORMAL LOW (ref 0.7–4.0)
MCH: 30.2 pg (ref 26.0–34.0)
MCHC: 37.4 g/dL — ABNORMAL HIGH (ref 30.0–36.0)
MCV: 80.8 fL (ref 80.0–100.0)
Monocytes Absolute: 0.2 10*3/uL (ref 0.1–1.0)
Monocytes Relative: 6 %
Neutro Abs: 2.7 10*3/uL (ref 1.7–7.7)
Neutrophils Relative %: 75 %
Platelet Count: 75 10*3/uL — ABNORMAL LOW (ref 150–400)
RBC: 4.17 MIL/uL — ABNORMAL LOW (ref 4.22–5.81)
RDW: 12.6 % (ref 11.5–15.5)
WBC Count: 3.6 10*3/uL — ABNORMAL LOW (ref 4.0–10.5)
nRBC: 0 % (ref 0.0–0.2)

## 2023-08-31 MED ORDER — OCTREOTIDE ACETATE 30 MG IM KIT
30.0000 mg | PACK | Freq: Once | INTRAMUSCULAR | Status: AC
  Administered 2023-08-31: 30 mg via INTRAMUSCULAR
  Filled 2023-08-31: qty 1

## 2023-08-31 NOTE — Patient Instructions (Signed)
 [facility]  Discharge Instructions: Thank you for choosing [facility] to provide your oncology and hematology care.   If you have a lab appointment with the Cancer Center, please go directly to the Cancer Center and check in at the registration area.   Wear comfortable clothing and clothing appropriate for easy access to any Portacath or PICC line.   We strive to give you quality time with your provider. You may need to reschedule your appointment if you arrive late (15 or more minutes).  Arriving late affects you and other patients whose appointments are after yours.  Also, if you miss three or more appointments without notifying the office, you may be dismissed from the clinic at the provider's discretion.      For prescription refill requests, have your pharmacy contact our office and allow 72 hours for refills to be completed.    Today you received the following chemotherapy and/or immunotherapy agents Sandostatin       To help prevent nausea and vomiting after your treatment, we encourage you to take your nausea medication as directed.  BELOW ARE SYMPTOMS THAT SHOULD BE REPORTED IMMEDIATELY: *FEVER GREATER THAN 100.4 F (38 C) OR HIGHER *CHILLS OR SWEATING *NAUSEA AND VOMITING THAT IS NOT CONTROLLED WITH YOUR NAUSEA MEDICATION *UNUSUAL SHORTNESS OF BREATH *UNUSUAL BRUISING OR BLEEDING *URINARY PROBLEMS (pain or burning when urinating, or frequent urination) *BOWEL PROBLEMS (unusual diarrhea, constipation, pain near the anus) TENDERNESS IN MOUTH AND THROAT WITH OR WITHOUT PRESENCE OF ULCERS (sore throat, sores in mouth, or a toothache) UNUSUAL RASH, SWELLING OR PAIN  UNUSUAL VAGINAL DISCHARGE OR ITCHING   Items with * indicate a potential emergency and should be followed up as soon as possible or go to the Emergency Department if any problems should occur.  Please show the CHEMOTHERAPY ALERT CARD or  IMMUNOTHERAPY ALERT CARD at check-in to the Emergency Department and triage nurse.  Should you have questions after your visit or need to cancel or reschedule your appointment, please contact [facility]  Dept: [phone]  and follow the prompts.  Office hours are 8:00 a.m. to 4:30 p.m. Monday - Friday. Please note that voicemails left after 4:00 p.m. may not be returned until the following business day.  We are closed weekends and major holidays. You have access to a nurse at all times for urgent questions. Please call the main number to the clinic Dept: [phone] and follow the prompts.   For any non-urgent questions, you may also contact your provider using MyChart. We now offer e-Visits for anyone 72 and older to request care online for non-urgent symptoms. For details visit [email or web address].   Also download the MyChart app! Go to the app store, search "MyChart", open the app, select [place], and log in with your MyChart username and password.  Octreotide Injection Solution What is this medication? OCTREOTIDE (ok TREE oh tide) treats high levels of growth hormone (acromegaly). It works by reducing the amount of growth hormone your body makes. This reduces symptoms and the risk of health problems caused by too much growth hormone, such as diabetes and heart disease. It may also be used to treat diarrhea caused by neuroendocrine tumors. It works by slowing down the release of serotonin from the tumor cells. This reduces the number of bowel movements you have. This medicine may be used for other purposes; ask  your health care provider or pharmacist if you have questions. COMMON BRAND NAME(S): Berline Lopes, Sandostatin What should I tell my care team before I take this medication? They need to know if you have any of these conditions: Diabetes Gallbladder disease Heart disease Kidney disease Liver disease Pancreatic  disease Thyroid disease An unusual or allergic reaction to octreotide, other medications, foods, dyes, or preservatives Pregnant or trying to get pregnant Breastfeeding How should I use this medication? This medication is injected under the skin or into a vein. It is usually given by your care team in a hospital or clinic setting. If you get this medication at home, you will be taught how to prepare and give it. Use exactly as directed. Take it as directed on the prescription label at the same time every day. Keep taking it unless your care team tells you to stop. Allow the injection solution to come to room temperature before use. Do not warm it artificially. It is important that you put your used needles and syringes in a special sharps container. Do not put them in a trash can. If you do not have a sharps container, call your pharmacist or care team to get one. Talk to your care team about the use of this medication in children. Special care may be needed. Overdosage: If you think you have taken too much of this medicine contact a poison control center or emergency room at once. NOTE: This medicine is only for you. Do not share this medicine with others. What if I miss a dose? If you miss a dose, take it as soon as you can. If it is almost time for your next dose, take only that dose. Do not take double or extra doses. What may interact with this medication? Bromocriptine Certain medications for blood pressure, heart disease, irregular heartbeat Cyclosporine Diuretics Medications for diabetes, including insulin Quinidine This list may not describe all possible interactions. Give your health care provider a list of all the medicines, herbs, non-prescription drugs, or dietary supplements you use. Also tell them if you smoke, drink alcohol, or use illegal drugs. Some items may interact with your medicine. What should I watch for while using this medication? Visit your care team for regular  checks on your progress. Tell your care team if your symptoms do not start to get better or if they get worse. To help reduce irritation at the injection site, use a different site for each injection and make sure the solution is at room temperature before use. This medication may cause decreases in blood sugar. Signs of low blood sugar include chills, cool, pale skin or cold sweats, drowsiness, extreme hunger, fast heartbeat, headache, nausea, nervousness or anxiety, shakiness, trembling, unsteadiness, tiredness, or weakness. Contact your care team right away if you experience any of these symptoms. This medication may increase blood sugar. The risk may be higher in patients who already have diabetes. Ask your care team what you can do to lower your risk of diabetes while taking this medication. You should make sure you get enough vitamin B12 while you are taking this medication. Discuss the foods you eat and the vitamins you take with your care team. What side effects may I notice from receiving this medication? Side effects that you should report to your care team as soon as possible: Allergic reactions--skin rash, itching, hives, swelling of the face, lips, tongue, or throat Gallbladder problems--severe stomach pain, nausea, vomiting, fever Heart rhythm changes--fast or irregular heartbeat, dizziness, feeling faint or  lightheaded, chest pain, trouble breathing High blood sugar (hyperglycemia)--increased thirst or amount of urine, unusual weakness or fatigue, blurry vision Low blood sugar (hypoglycemia)--tremors or shaking, anxiety, sweating, cold or clammy skin, confusion, dizziness, rapid heartbeat Low thyroid levels (hypothyroidism)--unusual weakness or fatigue, increased sensitivity to cold, constipation, hair loss, dry skin, weight gain, feelings of depression Low vitamin B12 level--pain, tingling, or numbness in the hands or feet, muscle weakness, dizziness, confusion, trouble  concentrating Oily or light-colored stools, diarrhea, bloating, weight loss Pancreatitis--severe stomach pain that spreads to your back or gets worse after eating or when touched, fever, nausea, vomiting Slow heartbeat--dizziness, feeling faint or lightheaded, confusion, trouble breathing, unusual weakness or fatigue Side effects that usually do not require medical attention (report these to your care team if they continue or are bothersome): Diarrhea Dizziness Headache Nausea Pain, redness, or irritation at injection site Stomach pain This list may not describe all possible side effects. Call your doctor for medical advice about side effects. You may report side effects to FDA at 1-800-FDA-1088. Where should I keep my medication? Keep out of the reach of children and pets. Store in the refrigerator. Protect from light. Allow to come to room temperature naturally. Do not use artificial heat. If protected from light, the injection may be stored between 20 and 30 degrees C (70 and 86 degrees F) for 14 days. After the initial use, throw away any unused portion of a multiple dose vial after 14 days. Get rid of any unused portions of the ampules after use. To get rid of medications that are no longer needed or have expired: Take the medication to a medication take-back program. Ask your pharmacy or law enforcement to find a location. If you cannot return the medication, ask your pharmacist or care team how to get rid of the medication safely. NOTE: This sheet is a summary. It may not cover all possible information. If you have questions about this medicine, talk to your doctor, pharmacist, or health care provider.  2024 Elsevier/Gold Standard (2023-01-27 00:00:00)

## 2023-08-31 NOTE — Telephone Encounter (Signed)
-----   Message from Arley Hof sent at 08/31/2023  4:09 PM EDT ----- Please call patient, blood sugar is very high, he should contact his primary provider as soon as possible, he needs treatment for diabetes  ----- Message ----- From: Rebecka, Lab In [place] Sent: 08/31/2023   3:01 PM EDT To: Arley KATHEE Hof, MD

## 2023-08-31 NOTE — Telephone Encounter (Signed)
Patient verbal understanding and had no further questions or concerns. 

## 2023-08-31 NOTE — Telephone Encounter (Signed)
 CRITICAL VALUE STICKER  CRITICAL VALUE:Glucose 522 RECEIVER (on-site recipient of call): Garen HERO DATE & TIME NOTIFIED: 08/31/2023 1538  MESSENGER (representative from lab):JYang  MD NOTIFIED: Dr. Cloretta  TIME OF NOTIFICATION:1539  RESPONSE:  Patient is aware of the abnormal level. Patient needs to reach out to his pcp.

## 2023-09-21 ENCOUNTER — Other Ambulatory Visit: Payer: Self-pay

## 2023-09-25 ENCOUNTER — Other Ambulatory Visit (HOSPITAL_COMMUNITY): Payer: Self-pay

## 2023-09-26 ENCOUNTER — Other Ambulatory Visit (HOSPITAL_COMMUNITY): Payer: Self-pay

## 2023-09-26 ENCOUNTER — Other Ambulatory Visit: Payer: Self-pay

## 2023-09-27 ENCOUNTER — Inpatient Hospital Stay

## 2023-09-27 ENCOUNTER — Other Ambulatory Visit: Payer: Self-pay

## 2023-09-27 ENCOUNTER — Other Ambulatory Visit (HOSPITAL_COMMUNITY): Payer: Self-pay

## 2023-09-27 VITALS — BP 158/87 | HR 86 | Temp 98.1°F

## 2023-09-27 DIAGNOSIS — C7A090 Malignant carcinoid tumor of the bronchus and lung: Secondary | ICD-10-CM | POA: Diagnosis not present

## 2023-09-27 LAB — CBC WITH DIFFERENTIAL (CANCER CENTER ONLY)
Abs Immature Granulocytes: 0.02 K/uL (ref 0.00–0.07)
Basophils Absolute: 0 K/uL (ref 0.0–0.1)
Basophils Relative: 0 %
Eosinophils Absolute: 0.1 K/uL (ref 0.0–0.5)
Eosinophils Relative: 1 %
HCT: 36.9 % — ABNORMAL LOW (ref 39.0–52.0)
Hemoglobin: 13.6 g/dL (ref 13.0–17.0)
Immature Granulocytes: 0 %
Lymphocytes Relative: 14 %
Lymphs Abs: 0.8 K/uL (ref 0.7–4.0)
MCH: 30 pg (ref 26.0–34.0)
MCHC: 36.9 g/dL — ABNORMAL HIGH (ref 30.0–36.0)
MCV: 81.3 fL (ref 80.0–100.0)
Monocytes Absolute: 0.4 K/uL (ref 0.1–1.0)
Monocytes Relative: 7 %
Neutro Abs: 4.4 K/uL (ref 1.7–7.7)
Neutrophils Relative %: 78 %
Platelet Count: 109 K/uL — ABNORMAL LOW (ref 150–400)
RBC: 4.54 MIL/uL (ref 4.22–5.81)
RDW: 13.1 % (ref 11.5–15.5)
WBC Count: 5.7 K/uL (ref 4.0–10.5)
nRBC: 0 % (ref 0.0–0.2)

## 2023-09-27 MED ORDER — OCTREOTIDE ACETATE 30 MG IM KIT
30.0000 mg | PACK | Freq: Once | INTRAMUSCULAR | Status: AC
Start: 2023-09-27 — End: 2023-09-27
  Administered 2023-09-27: 30 mg via INTRAMUSCULAR
  Filled 2023-09-27: qty 1

## 2023-09-27 NOTE — Patient Instructions (Signed)
 Octreotide Injection Solution What is this medication? OCTREOTIDE (ok TREE oh tide) treats high levels of growth hormone (acromegaly). It works by reducing the amount of growth hormone your body makes. This reduces symptoms and the risk of health problems caused by too much growth hormone, such as diabetes and heart disease. It may also be used to treat diarrhea caused by neuroendocrine tumors. It works by slowing down the release of serotonin from the tumor cells. This reduces the number of bowel movements you have. This medicine may be used for other purposes; ask your health care provider or pharmacist if you have questions. COMMON BRAND NAME(S): Berline Lopes, Sandostatin What should I tell my care team before I take this medication? They need to know if you have any of these conditions: Diabetes Gallbladder disease Heart disease Kidney disease Liver disease Pancreatic disease Thyroid disease An unusual or allergic reaction to octreotide, other medications, foods, dyes, or preservatives Pregnant or trying to get pregnant Breastfeeding How should I use this medication? This medication is injected under the skin or into a vein. It is usually given by your care team in a hospital or clinic setting. If you get this medication at home, you will be taught how to prepare and give it. Use exactly as directed. Take it as directed on the prescription label at the same time every day. Keep taking it unless your care team tells you to stop. Allow the injection solution to come to room temperature before use. Do not warm it artificially. It is important that you put your used needles and syringes in a special sharps container. Do not put them in a trash can. If you do not have a sharps container, call your pharmacist or care team to get one. Talk to your care team about the use of this medication in children. Special care may be needed. Overdosage: If you think you have taken too much of this medicine  contact a poison control center or emergency room at once. NOTE: This medicine is only for you. Do not share this medicine with others. What if I miss a dose? If you miss a dose, take it as soon as you can. If it is almost time for your next dose, take only that dose. Do not take double or extra doses. What may interact with this medication? Bromocriptine Certain medications for blood pressure, heart disease, irregular heartbeat Cyclosporine Diuretics Medications for diabetes, including insulin Quinidine This list may not describe all possible interactions. Give your health care provider a list of all the medicines, herbs, non-prescription drugs, or dietary supplements you use. Also tell them if you smoke, drink alcohol, or use illegal drugs. Some items may interact with your medicine. What should I watch for while using this medication? Visit your care team for regular checks on your progress. Tell your care team if your symptoms do not start to get better or if they get worse. To help reduce irritation at the injection site, use a different site for each injection and make sure the solution is at room temperature before use. This medication may cause decreases in blood sugar. Signs of low blood sugar include chills, cool, pale skin or cold sweats, drowsiness, extreme hunger, fast heartbeat, headache, nausea, nervousness or anxiety, shakiness, trembling, unsteadiness, tiredness, or weakness. Contact your care team right away if you experience any of these symptoms. This medication may increase blood sugar. The risk may be higher in patients who already have diabetes. Ask your care team what you can  do to lower your risk of diabetes while taking this medication. You should make sure you get enough vitamin B12 while you are taking this medication. Discuss the foods you eat and the vitamins you take with your care team. What side effects may I notice from receiving this medication? Side effects that  you should report to your care team as soon as possible: Allergic reactions--skin rash, itching, hives, swelling of the face, lips, tongue, or throat Gallbladder problems--severe stomach pain, nausea, vomiting, fever Heart rhythm changes--fast or irregular heartbeat, dizziness, feeling faint or lightheaded, chest pain, trouble breathing High blood sugar (hyperglycemia)--increased thirst or amount of urine, unusual weakness or fatigue, blurry vision Low blood sugar (hypoglycemia)--tremors or shaking, anxiety, sweating, cold or clammy skin, confusion, dizziness, rapid heartbeat Low thyroid levels (hypothyroidism)--unusual weakness or fatigue, increased sensitivity to cold, constipation, hair loss, dry skin, weight gain, feelings of depression Low vitamin B12 level--pain, tingling, or numbness in the hands or feet, muscle weakness, dizziness, confusion, trouble concentrating Oily or light-colored stools, diarrhea, bloating, weight loss Pancreatitis--severe stomach pain that spreads to your back or gets worse after eating or when touched, fever, nausea, vomiting Slow heartbeat--dizziness, feeling faint or lightheaded, confusion, trouble breathing, unusual weakness or fatigue Side effects that usually do not require medical attention (report these to your care team if they continue or are bothersome): Diarrhea Dizziness Headache Nausea Pain, redness, or irritation at injection site Stomach pain This list may not describe all possible side effects. Call your doctor for medical advice about side effects. You may report side effects to FDA at 1-800-FDA-1088. Where should I keep my medication? Keep out of the reach of children and pets. Store in the refrigerator. Protect from light. Allow to come to room temperature naturally. Do not use artificial heat. If protected from light, the injection may be stored between 20 and 30 degrees C (70 and 86 degrees F) for 14 days. After the initial use, throw away  any unused portion of a multiple dose vial after 14 days. Get rid of any unused portions of the ampules after use. To get rid of medications that are no longer needed or have expired: Take the medication to a medication take-back program. Ask your pharmacy or law enforcement to find a location. If you cannot return the medication, ask your pharmacist or care team how to get rid of the medication safely. NOTE: This sheet is a summary. It may not cover all possible information. If you have questions about this medicine, talk to your doctor, pharmacist, or health care provider.  2024 Elsevier/Gold Standard (2023-01-27 00:00:00)

## 2023-09-27 NOTE — Progress Notes (Signed)
 Specialty Pharmacy Refill Coordination Note  Spoke with Michael Good.  Michael Parlett. is a 67 y.o. male contacted today regarding refills of specialty medication(s) Everolimus  (AFINITOR )  Doses on hand: 4  Patient requested: Delivery   Delivery date: 09/29/23   Verified address: [address] [place] [address]  Medication will be filled on 09/28/23.

## 2023-10-16 ENCOUNTER — Other Ambulatory Visit: Payer: Self-pay

## 2023-10-16 NOTE — Progress Notes (Signed)
 Specialty Pharmacy Ongoing Clinical Assessment Note  Dionne Rossa. is a 68 y.o. male who is being followed by the specialty pharmacy service for RxSp Oncology   Patient's specialty medication(s) reviewed today: No data recorded  Missed doses in the last 4 weeks: No data recorded  Patient/Caregiver did not have any additional questions or concerns.   Therapeutic benefit summary: Patient is achieving benefit   Adverse events/side effects summary: Experienced adverse events/side effects (loss of appetite (tolerable), also new mouth sores (plans to discuss at appt next week))   Patient's therapy is appropriate to: Continue    Goals Addressed             This Visit's Progress    Slow Disease Progression   On track    Patient is on track. Patient will maintain adherence.  Recent PET scan was documented to show stable disease (office visit 04/12/23). Patient to have another PET scan in December         Follow up: 6 months  [facility] Specialty Pharmacist

## 2023-10-23 ENCOUNTER — Inpatient Hospital Stay

## 2023-10-23 ENCOUNTER — Inpatient Hospital Stay (HOSPITAL_BASED_OUTPATIENT_CLINIC_OR_DEPARTMENT_OTHER): Admitting: Nurse Practitioner

## 2023-10-23 ENCOUNTER — Encounter: Payer: Self-pay | Admitting: Nurse Practitioner

## 2023-10-23 ENCOUNTER — Inpatient Hospital Stay: Attending: Oncology

## 2023-10-23 VITALS — BP 145/83 | HR 67 | Temp 97.9°F | Resp 18 | Ht 73.0 in | Wt 172.6 lb

## 2023-10-23 DIAGNOSIS — I10 Essential (primary) hypertension: Secondary | ICD-10-CM | POA: Insufficient documentation

## 2023-10-23 DIAGNOSIS — C7B03 Secondary carcinoid tumors of bone: Secondary | ICD-10-CM | POA: Insufficient documentation

## 2023-10-23 DIAGNOSIS — N529 Male erectile dysfunction, unspecified: Secondary | ICD-10-CM | POA: Diagnosis not present

## 2023-10-23 DIAGNOSIS — G4730 Sleep apnea, unspecified: Secondary | ICD-10-CM | POA: Diagnosis not present

## 2023-10-23 DIAGNOSIS — C7A090 Malignant carcinoid tumor of the bronchus and lung: Secondary | ICD-10-CM

## 2023-10-23 DIAGNOSIS — C7B02 Secondary carcinoid tumors of liver: Secondary | ICD-10-CM | POA: Diagnosis present

## 2023-10-23 DIAGNOSIS — D696 Thrombocytopenia, unspecified: Secondary | ICD-10-CM | POA: Diagnosis not present

## 2023-10-23 DIAGNOSIS — E1165 Type 2 diabetes mellitus with hyperglycemia: Secondary | ICD-10-CM | POA: Diagnosis not present

## 2023-10-23 LAB — CBC WITH DIFFERENTIAL (CANCER CENTER ONLY)
Abs Immature Granulocytes: 0.01 K/uL (ref 0.00–0.07)
Basophils Absolute: 0 K/uL (ref 0.0–0.1)
Basophils Relative: 1 %
Eosinophils Absolute: 0.1 K/uL (ref 0.0–0.5)
Eosinophils Relative: 2 %
HCT: 33.8 % — ABNORMAL LOW (ref 39.0–52.0)
Hemoglobin: 12.3 g/dL — ABNORMAL LOW (ref 13.0–17.0)
Immature Granulocytes: 0 %
Lymphocytes Relative: 17 %
Lymphs Abs: 0.6 K/uL — ABNORMAL LOW (ref 0.7–4.0)
MCH: 30.1 pg (ref 26.0–34.0)
MCHC: 36.4 g/dL — ABNORMAL HIGH (ref 30.0–36.0)
MCV: 82.6 fL (ref 80.0–100.0)
Monocytes Absolute: 0.2 K/uL (ref 0.1–1.0)
Monocytes Relative: 7 %
Neutro Abs: 2.4 K/uL (ref 1.7–7.7)
Neutrophils Relative %: 73 %
Platelet Count: 94 K/uL — ABNORMAL LOW (ref 150–400)
RBC: 4.09 MIL/uL — ABNORMAL LOW (ref 4.22–5.81)
RDW: 13 % (ref 11.5–15.5)
WBC Count: 3.3 K/uL — ABNORMAL LOW (ref 4.0–10.5)
nRBC: 0 % (ref 0.0–0.2)

## 2023-10-23 LAB — CMP (CANCER CENTER ONLY)
ALT: 23 U/L (ref 0–44)
AST: 25 U/L (ref 15–41)
Albumin: 4.4 g/dL (ref 3.5–5.0)
Alkaline Phosphatase: 127 U/L — ABNORMAL HIGH (ref 38–126)
Anion gap: 12 (ref 5–15)
BUN: 16 mg/dL (ref 8–23)
CO2: 24 mmol/L (ref 22–32)
Calcium: 8.8 mg/dL — ABNORMAL LOW (ref 8.9–10.3)
Chloride: 101 mmol/L (ref 98–111)
Creatinine: 1.18 mg/dL (ref 0.61–1.24)
GFR, Estimated: >60 mL/min (ref >60)
Glucose, Bld: 429 mg/dL — ABNORMAL HIGH (ref 70–99)
Potassium: 4 mmol/L (ref 3.5–5.1)
Sodium: 137 mmol/L (ref 135–145)
Total Bilirubin: 1.1 mg/dL (ref 0.0–1.2)
Total Protein: 6.9 g/dL (ref 6.5–8.1)

## 2023-10-23 MED ORDER — OCTREOTIDE ACETATE 30 MG IM KIT
30.0000 mg | PACK | Freq: Once | INTRAMUSCULAR | Status: AC
  Administered 2023-10-23: 30 mg via INTRAMUSCULAR
  Filled 2023-10-23: qty 1

## 2023-10-23 NOTE — Patient Instructions (Signed)
 Octreotide Injection Suspension What is this medication? OCTREOTIDE (ok TREE oh tide) treats high levels of growth hormone (acromegaly). It works by reducing the amount of growth hormone your body makes. This reduces symptoms and the risk of health problems caused by too much growth hormone, such as diabetes and heart disease. It may also be used to treat diarrhea caused by neuroendocrine tumors. It works by slowing down the release of serotonin from the tumor cells. This reduces the number of bowel movements you have. This medicine may be used for other purposes; ask your health care provider or pharmacist if you have questions. COMMON BRAND NAME(S): Sandostatin LAR What should I tell my care team before I take this medication? They need to know if you have any of these conditions: Diabetes Gallbladder disease Heart disease Kidney disease Liver disease Pancreatic disease Thyroid disease An unusual or allergic reaction to octreotide, other medications, foods, dyes, or preservatives Pregnant or trying to get pregnant Breastfeeding How should I use this medication? This medication is injected into a muscle. It is usually given by your care team in a hospital or clinic setting. Talk to your care team about the use of this medication in children. Special care may be needed. Overdosage: If you think you have taken too much of this medicine contact a poison control center or emergency room at once. NOTE: This medicine is only for you. Do not share this medicine with others. What if I miss a dose? Keep appointments for follow-up doses. It is important not to miss your dose. Call your care team if you are unable to keep an appointment. What may interact with this medication? Do not take this medication with any of the following: Cisapride Dronedarone Flibanserin Lutetium Lu 177 dotatate Pimozide Saquinavir Thioridazine This medication may also interact with the  following: Bromocriptine Certain medications for blood pressure, heart disease, irregular heartbeat Cyclosporine Diuretics Medications for diabetes, including insulin Quinidine This list may not describe all possible interactions. Give your health care provider a list of all the medicines, herbs, non-prescription drugs, or dietary supplements you use. Also tell them if you smoke, drink alcohol, or use illegal drugs. Some items may interact with your medicine. What should I watch for while using this medication? Visit your care team for regular checks on your progress. Tell your care team if your symptoms do not start to get better or if they get worse. This medication may cause decreases in blood sugar. Signs of low blood sugar include chills, cool, pale skin or cold sweats, drowsiness, extreme hunger, fast heartbeat, headache, nausea, nervousness or anxiety, shakiness, trembling, unsteadiness, tiredness, or weakness. Contact your care team right away if you experience any of these symptoms. This medication may increase blood sugar. The risk may be higher in patients who already have diabetes. Ask your care team what you can do to lower your risk of diabetes while taking this medication. You should make sure you get enough vitamin B12 while you are taking this medication. Discuss the foods you eat and the vitamins you take with your care team. What side effects may I notice from receiving this medication? Side effects that you should report to your care team as soon as possible: Allergic reactions--skin rash, itching, hives, swelling of the face, lips, tongue, or throat Gallbladder problems--severe stomach pain, nausea, vomiting, fever Heart rhythm changes--fast or irregular heartbeat, dizziness, feeling faint or lightheaded, chest pain, trouble breathing High blood sugar (hyperglycemia)--increased thirst or amount of urine, unusual weakness or fatigue,  blurry vision Low blood sugar  (hypoglycemia)--tremors or shaking, anxiety, sweating, cold or clammy skin, confusion, dizziness, rapid heartbeat Low thyroid levels (hypothyroidism)--unusual weakness or fatigue, increased sensitivity to cold, constipation, hair loss, dry skin, weight gain, feelings of depression Low vitamin B12 level--pain, tingling, or numbness in the hands or feet, muscle weakness, dizziness, confusion, trouble concentrating Oily or light-colored stools, diarrhea, bloating, weight loss Pancreatitis--severe stomach pain that spreads to your back or gets worse after eating or when touched, fever, nausea, vomiting Slow heartbeat--dizziness, feeling faint or lightheaded, confusion, trouble breathing, unusual weakness or fatigue Side effects that usually do not require medical attention (report these to your care team if they continue or are bothersome): Diarrhea Dizziness Headache Nausea Pain, redness, or irritation at injection site Stomach pain This list may not describe all possible side effects. Call your doctor for medical advice about side effects. You may report side effects to FDA at 1-800-FDA-1088. Where should I keep my medication? This medication is given in a hospital or clinic. It will not be stored at home. NOTE: This sheet is a summary. It may not cover all possible information. If you have questions about this medicine, talk to your doctor, pharmacist, or health care provider.  2024 Elsevier/Gold Standard (2023-01-27 00:00:00)

## 2023-10-23 NOTE — Progress Notes (Signed)
 [facility] OFFICE PROGRESS NOTE   Diagnosis: Metastatic carcinoid tumor  INTERVAL HISTORY:   Mr. Penna returns as scheduled.  He continues everolimus  and monthly Sandostatin .  He reports continued poor energy and appetite.  He notes easy bruising.  No spontaneous bleeding.  No fever.  No abdominal pain.  He denies nausea/vomiting.  No diarrhea.  Last week he noted a few small mouth sores which resolved spontaneously.  Objective:  Vital signs in last 24 hours:  Blood pressure (!) 145/83, pulse 67, temperature 97.9 F (36.6 C), resp. rate 18, height 6' 1 (1.854 m), weight 172 lb 9.6 oz (78.3 kg), SpO2 100%.    HEENT: No thrush or ulcers. Resp: Lungs clear bilaterally. Cardio: Regular rate and rhythm. GI: No hepatosplenomegaly.  Nontender.  No mass. Vascular: No leg edema. Skin: A few small ecchymoses/abrasions on the forearms.   Lab Results:  Lab Results  Component Value Date   WBC 3.3 (L) 10/23/2023   HGB 12.3 (L) 10/23/2023   HCT 33.8 (L) 10/23/2023   MCV 82.6 10/23/2023   PLT 94 (L) 10/23/2023   NEUTROABS 2.4 10/23/2023    Imaging:  No results found.  Medications: I have reviewed the patient's current medications.  Assessment/Plan: Metastatic carcinoid tumor Left lower lobectomy February 2004 at [facility], 2.2 cm typical carcinoid tumor Laparoscopic cholecystectomy November 2014, imaging revealed a 4.4 cm segment 8 liver lesion 03/18/2013-central hepatectomy and drainage of intra-abdominal abscess,- low-grade neuroendocrine carcinoma, tumor appeared totally excised,Ki-67 less than 5% CT's 05/09/2014, 08/16/2016, 09/19/2017-no evidence of recurrent disease CTs 914 2020-3 new liver lesions 12/26/2018 PET dotatate scan-at least 20 liver lesions and extensive bone metastases, hypermetabolic pancreas tail lesion 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 01/16/2019-biopsy of the left hepatic lobe lesion-metastatic neuroendocrine carcinoma,Ki-67 10%-  interpretation limited due to scant remaining tumor may not be representative of the overall lesion, mismatch repair protein staining intact 02/27/2019-Sandostatin  30 mg every 4 weeks 09/18/2019- Netspot  PET-increased activity in the pancreas tail and liver and bone metastases Late August 2021-everolimus , 7.5 mg daily, placed on hold September 2021 due to acute hyperglycemia/new onset diabetes 01/22/2020-everolimus  resumed Netspot  09/29/2020-innumerable liver metastases, unchanged, stable tracer uptake in distal pancreas lesion, innumerable bone lesions, significant decrease in tracer uptake in left humeral lesion, mild decrease in FDG uptake in other bone lesions, no new site of metastatic disease Netspot  02/02/2022-no change from scan on 09/29/2020, multifocal hepatic and bone metastases.  Small tail of the pancreas lesion Sandostatin  and everolimus  continued Netspot  02/14/2023-essentially identical pattern of metastatic well-differentiated tumor compared to dotatate PET scan 02/02/2022.  No change in number, pattern or intensity of lesions.  No new lesions.  Stable small lesion in the tail of the pancreas. Diabetes ERCP removal of bile duct stones March 2015, March 2018 Sleep apnea Hypertension COVID-19 infection September 2021 Cholecystectomy November 2014 Umbilical hernia repair 03/18/2013 Erectile dysfunction Chronic thrombocytopenia    Disposition: Mr. Knierim appears stable.  He is tolerating everolimus  well.  Plan to continue the same.  He will continue monthly Sandostatin .  Restaging dotatate PET in December.  We reviewed the CBC and chemistry panel from today.  Blood counts are stable.  Blood sugar is consistently elevated in our office, today 429.  We again discussed the importance of management of diabetes.  He understands our recommendation to follow-up with his primary provider.  He will return for lab and Sandostatin  every 4 weeks.  He will be scheduled for a 64-month office  visit.  Olam Ned ANP/GNP-BC  10/23/2023  2:08 PM

## 2023-10-24 LAB — CHROMOGRANIN A: Chromogranin A (ng/mL): 702.9 ng/mL — ABNORMAL HIGH (ref 0.0–101.8)

## 2023-10-31 ENCOUNTER — Other Ambulatory Visit: Payer: Self-pay

## 2023-11-01 ENCOUNTER — Other Ambulatory Visit: Payer: Self-pay

## 2023-11-01 ENCOUNTER — Other Ambulatory Visit: Payer: Self-pay | Admitting: *Deleted

## 2023-11-01 DIAGNOSIS — C7A090 Malignant carcinoid tumor of the bronchus and lung: Secondary | ICD-10-CM

## 2023-11-01 MED ORDER — EVEROLIMUS 7.5 MG PO TABS
ORAL_TABLET | Freq: Every day | ORAL | 3 refills | Status: DC
  Filled 2023-11-01: qty 30, fill #0
  Filled 2023-11-02: qty 30, 30d supply, fill #0
  Filled 2023-11-28: qty 30, 30d supply, fill #1
  Filled 2023-12-27: qty 30, 30d supply, fill #2
  Filled 2024-01-17 – 2024-02-07 (×3): qty 30, 30d supply, fill #3

## 2023-11-01 NOTE — Telephone Encounter (Signed)
 Received incoming message from pharmacy that Mr. Voth needs refill on his everlimus.

## 2023-11-02 ENCOUNTER — Other Ambulatory Visit: Payer: Self-pay

## 2023-11-02 NOTE — Progress Notes (Signed)
 Specialty Pharmacy Refill Coordination Note  Michael Good. is a 68 y.o. male contacted today regarding refills of specialty medication(s) No data recorded  Patient requested No data recorded  Delivery date: No data recorded  Verified address: No data recorded  Medication will be filled on 09.04.25 but patient aware medication may not mail until 9.5.25.

## 2023-11-03 ENCOUNTER — Other Ambulatory Visit: Payer: Self-pay

## 2023-11-06 ENCOUNTER — Other Ambulatory Visit (HOSPITAL_COMMUNITY): Payer: Self-pay

## 2023-11-06 ENCOUNTER — Other Ambulatory Visit: Payer: Self-pay

## 2023-11-06 ENCOUNTER — Telehealth: Payer: Self-pay | Admitting: Pharmacist

## 2023-11-06 NOTE — Telephone Encounter (Signed)
 Pts wife called stating pt took his last tablet of Everolimus  today.   I informed her of Rx send out on 11/03/23 per specialty pharmacy notes.  I attempted to transfer her call to outpatient pharmacy. Pts wife was 'feeding the baby' and could not take down the phone number.  I advised if we were disconnected, to call the phone number on her husbands medicine bottle.  Temiloluwa Laredo, Pharm.D., CPP 11/06/2023@12 :02 PM

## 2023-11-07 ENCOUNTER — Other Ambulatory Visit: Payer: Self-pay

## 2023-11-07 NOTE — Progress Notes (Signed)
 Delivery lost in transit, return to sender request initiated should recovery be possible. Patient has been notified of new UPS delivery and have been provided tracking information via Mychart message. Tracking #  [id number]  Medication will be shipped 11/07/23 and delivered by 11/08/23 to verified address of: [address] [place] [address]

## 2023-11-08 ENCOUNTER — Other Ambulatory Visit: Payer: Self-pay

## 2023-11-22 ENCOUNTER — Inpatient Hospital Stay

## 2023-11-22 ENCOUNTER — Other Ambulatory Visit (HOSPITAL_COMMUNITY): Payer: Self-pay

## 2023-11-22 ENCOUNTER — Inpatient Hospital Stay: Attending: Oncology

## 2023-11-22 ENCOUNTER — Other Ambulatory Visit: Payer: Self-pay

## 2023-11-22 VITALS — BP 155/81 | HR 75 | Temp 98.1°F | Resp 18 | Wt 170.6 lb

## 2023-11-22 DIAGNOSIS — C7A090 Malignant carcinoid tumor of the bronchus and lung: Secondary | ICD-10-CM | POA: Diagnosis present

## 2023-11-22 DIAGNOSIS — C7B02 Secondary carcinoid tumors of liver: Secondary | ICD-10-CM | POA: Diagnosis present

## 2023-11-22 DIAGNOSIS — C7B03 Secondary carcinoid tumors of bone: Secondary | ICD-10-CM | POA: Insufficient documentation

## 2023-11-22 LAB — CBC WITH DIFFERENTIAL (CANCER CENTER ONLY)
Abs Immature Granulocytes: 0.01 K/uL (ref 0.00–0.07)
Basophils Absolute: 0 K/uL (ref 0.0–0.1)
Basophils Relative: 1 %
Eosinophils Absolute: 0.1 K/uL (ref 0.0–0.5)
Eosinophils Relative: 2 %
HCT: 34.7 % — ABNORMAL LOW (ref 39.0–52.0)
Hemoglobin: 12.5 g/dL — ABNORMAL LOW (ref 13.0–17.0)
Immature Granulocytes: 0 %
Lymphocytes Relative: 19 %
Lymphs Abs: 0.7 K/uL (ref 0.7–4.0)
MCH: 30.2 pg (ref 26.0–34.0)
MCHC: 36 g/dL (ref 30.0–36.0)
MCV: 83.8 fL (ref 80.0–100.0)
Monocytes Absolute: 0.4 K/uL (ref 0.1–1.0)
Monocytes Relative: 9 %
Neutro Abs: 2.6 K/uL (ref 1.7–7.7)
Neutrophils Relative %: 69 %
Platelet Count: 77 K/uL — ABNORMAL LOW (ref 150–400)
RBC: 4.14 MIL/uL — ABNORMAL LOW (ref 4.22–5.81)
RDW: 12.9 % (ref 11.5–15.5)
WBC Count: 3.7 K/uL — ABNORMAL LOW (ref 4.0–10.5)
nRBC: 0 % (ref 0.0–0.2)

## 2023-11-22 LAB — CMP (CANCER CENTER ONLY)
ALT: 25 U/L (ref 0–44)
AST: 27 U/L (ref 15–41)
Albumin: 4.5 g/dL (ref 3.5–5.0)
Alkaline Phosphatase: 133 U/L — ABNORMAL HIGH (ref 38–126)
Anion gap: 11 (ref 5–15)
BUN: 14 mg/dL (ref 8–23)
CO2: 25 mmol/L (ref 22–32)
Calcium: 9.2 mg/dL (ref 8.9–10.3)
Chloride: 103 mmol/L (ref 98–111)
Creatinine: 1.22 mg/dL (ref 0.61–1.24)
GFR, Estimated: >60 mL/min (ref >60)
Glucose, Bld: 350 mg/dL — ABNORMAL HIGH (ref 70–99)
Potassium: 4.2 mmol/L (ref 3.5–5.1)
Sodium: 139 mmol/L (ref 135–145)
Total Bilirubin: 1.1 mg/dL (ref 0.0–1.2)
Total Protein: 7.1 g/dL (ref 6.5–8.1)

## 2023-11-22 MED ORDER — OCTREOTIDE ACETATE 30 MG IM KIT
30.0000 mg | PACK | Freq: Once | INTRAMUSCULAR | Status: AC
  Administered 2023-11-22: 30 mg via INTRAMUSCULAR
  Filled 2023-11-22: qty 1

## 2023-11-22 NOTE — Patient Instructions (Signed)
 [facility] - A DEPT OF [facility]  Discharge Instructions: Thank you for choosing [facility] to provide your oncology and hematology care.   If you have a lab appointment with the Cancer Center, please go directly to the Cancer Center and check in at the registration area.   Wear comfortable clothing and clothing appropriate for easy access to any Portacath or PICC line.   We strive to give you quality time with your provider. You may need to reschedule your appointment if you arrive late (15 or more minutes).  Arriving late affects you and other patients whose appointments are after yours.  Also, if you miss three or more appointments without notifying the office, you may be dismissed from the clinic at the provider's discretion.      For prescription refill requests, have your pharmacy contact our office and allow 72 hours for refills to be completed.    Today you received the following chemotherapy and/or immunotherapy agents: sandostatin       To help prevent nausea and vomiting after your treatment, we encourage you to take your nausea medication as directed.  BELOW ARE SYMPTOMS THAT SHOULD BE REPORTED IMMEDIATELY: *FEVER GREATER THAN 100.4 F (38 C) OR HIGHER *CHILLS OR SWEATING *NAUSEA AND VOMITING THAT IS NOT CONTROLLED WITH YOUR NAUSEA MEDICATION *UNUSUAL SHORTNESS OF BREATH *UNUSUAL BRUISING OR BLEEDING *URINARY PROBLEMS (pain or burning when urinating, or frequent urination) *BOWEL PROBLEMS (unusual diarrhea, constipation, pain near the anus) TENDERNESS IN MOUTH AND THROAT WITH OR WITHOUT PRESENCE OF ULCERS (sore throat, sores in mouth, or a toothache) UNUSUAL RASH, SWELLING OR PAIN  UNUSUAL VAGINAL DISCHARGE OR ITCHING   Items with * indicate a potential emergency and should be followed up as soon as possible or go to the Emergency Department if any problems should occur.  Please show the CHEMOTHERAPY ALERT CARD or  IMMUNOTHERAPY ALERT CARD at check-in to the Emergency Department and triage nurse.  Should you have questions after your visit or need to cancel or reschedule your appointment, please contact [facility] - A DEPT OF [facility]  Dept: [phone]  and follow the prompts.  Office hours are 8:00 a.m. to 4:30 p.m. Monday - Friday. Please note that voicemails left after 4:00 p.m. may not be returned until the following business day.  We are closed weekends and major holidays. You have access to a nurse at all times for urgent questions. Please call the main number to the clinic Dept: [phone] and follow the prompts.   For any non-urgent questions, you may also contact your provider using MyChart. We now offer e-Visits for anyone 42 and older to request care online for non-urgent symptoms. For details visit [email or web address].   Also download the MyChart app! Go to the app store, search MyChart, open the app, select [facility], and log in with your MyChart username and password.

## 2023-11-28 ENCOUNTER — Other Ambulatory Visit: Payer: Self-pay

## 2023-11-28 NOTE — Progress Notes (Signed)
 Specialty Pharmacy Refill Coordination Note  Michael Good. is a 68 y.o. male contacted today regarding refills of specialty medication(s) Everolimus  (AFINITOR )   Patient requested Delivery   Delivery date: 11/29/23   Verified address: [address] [place] [phone]   Medication will be filled on 11/28/23.

## 2023-12-20 ENCOUNTER — Inpatient Hospital Stay: Attending: Oncology

## 2023-12-20 ENCOUNTER — Encounter: Payer: Self-pay | Admitting: Nutrition

## 2023-12-20 ENCOUNTER — Inpatient Hospital Stay

## 2023-12-20 VITALS — BP 153/78 | HR 78 | Temp 98.3°F | Resp 18

## 2023-12-20 DIAGNOSIS — C7B03 Secondary carcinoid tumors of bone: Secondary | ICD-10-CM | POA: Insufficient documentation

## 2023-12-20 DIAGNOSIS — C7B02 Secondary carcinoid tumors of liver: Secondary | ICD-10-CM | POA: Diagnosis present

## 2023-12-20 DIAGNOSIS — C7A090 Malignant carcinoid tumor of the bronchus and lung: Secondary | ICD-10-CM | POA: Insufficient documentation

## 2023-12-20 LAB — CBC WITH DIFFERENTIAL (CANCER CENTER ONLY)
Abs Immature Granulocytes: 0.01 K/uL (ref 0.00–0.07)
Basophils Absolute: 0 K/uL (ref 0.0–0.1)
Basophils Relative: 0 %
Eosinophils Absolute: 0 K/uL (ref 0.0–0.5)
Eosinophils Relative: 1 %
HCT: 35.9 % — ABNORMAL LOW (ref 39.0–52.0)
Hemoglobin: 13 g/dL (ref 13.0–17.0)
Immature Granulocytes: 0 %
Lymphocytes Relative: 18 %
Lymphs Abs: 0.8 K/uL (ref 0.7–4.0)
MCH: 29.8 pg (ref 26.0–34.0)
MCHC: 36.2 g/dL — ABNORMAL HIGH (ref 30.0–36.0)
MCV: 82.3 fL (ref 80.0–100.0)
Monocytes Absolute: 0.3 K/uL (ref 0.1–1.0)
Monocytes Relative: 7 %
Neutro Abs: 3 K/uL (ref 1.7–7.7)
Neutrophils Relative %: 74 %
Platelet Count: 97 K/uL — ABNORMAL LOW (ref 150–400)
RBC: 4.36 MIL/uL (ref 4.22–5.81)
RDW: 13.5 % (ref 11.5–15.5)
WBC Count: 4.1 K/uL (ref 4.0–10.5)
nRBC: 0 % (ref 0.0–0.2)

## 2023-12-20 LAB — CMP (CANCER CENTER ONLY)
ALT: 33 U/L (ref 0–44)
AST: 37 U/L (ref 15–41)
Albumin: 4.5 g/dL (ref 3.5–5.0)
Alkaline Phosphatase: 139 U/L — ABNORMAL HIGH (ref 38–126)
Anion gap: 10 (ref 5–15)
BUN: 15 mg/dL (ref 8–23)
CO2: 26 mmol/L (ref 22–32)
Calcium: 9.3 mg/dL (ref 8.9–10.3)
Chloride: 102 mmol/L (ref 98–111)
Creatinine: 1.17 mg/dL (ref 0.61–1.24)
GFR, Estimated: >60 mL/min (ref >60)
Glucose, Bld: 395 mg/dL — ABNORMAL HIGH (ref 70–99)
Potassium: 4.2 mmol/L (ref 3.5–5.1)
Sodium: 138 mmol/L (ref 135–145)
Total Bilirubin: 1 mg/dL (ref 0.0–1.2)
Total Protein: 7.4 g/dL (ref 6.5–8.1)

## 2023-12-20 MED ORDER — OCTREOTIDE ACETATE 30 MG IM KIT
30.0000 mg | PACK | Freq: Once | INTRAMUSCULAR | Status: AC
  Administered 2023-12-20: 30 mg via INTRAMUSCULAR
  Filled 2023-12-20: qty 1

## 2023-12-20 NOTE — Patient Instructions (Signed)
 [facility] - A DEPT OF [facility]  Discharge Instructions: Thank you for choosing [facility] to provide your oncology and hematology care.   If you have a lab appointment with the Cancer Center, please go directly to the Cancer Center and check in at the registration area.   Wear comfortable clothing and clothing appropriate for easy access to any Portacath or PICC line.   We strive to give you quality time with your provider. You may need to reschedule your appointment if you arrive late (15 or more minutes).  Arriving late affects you and other patients whose appointments are after yours.  Also, if you miss three or more appointments without notifying the office, you may be dismissed from the clinic at the provider's discretion.      For prescription refill requests, have your pharmacy contact our office and allow 72 hours for refills to be completed.    Today you received the following chemotherapy and/or immunotherapy agents: sandostatin       To help prevent nausea and vomiting after your treatment, we encourage you to take your nausea medication as directed.  BELOW ARE SYMPTOMS THAT SHOULD BE REPORTED IMMEDIATELY: *FEVER GREATER THAN 100.4 F (38 C) OR HIGHER *CHILLS OR SWEATING *NAUSEA AND VOMITING THAT IS NOT CONTROLLED WITH YOUR NAUSEA MEDICATION *UNUSUAL SHORTNESS OF BREATH *UNUSUAL BRUISING OR BLEEDING *URINARY PROBLEMS (pain or burning when urinating, or frequent urination) *BOWEL PROBLEMS (unusual diarrhea, constipation, pain near the anus) TENDERNESS IN MOUTH AND THROAT WITH OR WITHOUT PRESENCE OF ULCERS (sore throat, sores in mouth, or a toothache) UNUSUAL RASH, SWELLING OR PAIN  UNUSUAL VAGINAL DISCHARGE OR ITCHING   Items with * indicate a potential emergency and should be followed up as soon as possible or go to the Emergency Department if any problems should occur.  Please show the CHEMOTHERAPY ALERT CARD or  IMMUNOTHERAPY ALERT CARD at check-in to the Emergency Department and triage nurse.  Should you have questions after your visit or need to cancel or reschedule your appointment, please contact [facility] - A DEPT OF [facility]  Dept: [phone]  and follow the prompts.  Office hours are 8:00 a.m. to 4:30 p.m. Monday - Friday. Please note that voicemails left after 4:00 p.m. may not be returned until the following business day.  We are closed weekends and major holidays. You have access to a nurse at all times for urgent questions. Please call the main number to the clinic Dept: [phone] and follow the prompts.   For any non-urgent questions, you may also contact your provider using MyChart. We now offer e-Visits for anyone 42 and older to request care online for non-urgent symptoms. For details visit [email or web address].   Also download the MyChart app! Go to the app store, search MyChart, open the app, select [facility], and log in with your MyChart username and password.

## 2023-12-21 ENCOUNTER — Telehealth: Payer: Self-pay

## 2023-12-21 ENCOUNTER — Other Ambulatory Visit (HOSPITAL_COMMUNITY): Payer: Self-pay

## 2023-12-21 NOTE — Telephone Encounter (Signed)
-----   Message from Olam Ned sent at 12/20/2023  3:59 PM EDT ----- Please let him know CBC is stable.  Blood sugar remains elevated.  Recommend he see PCP for management of diabetes.  Follow-up as scheduled.

## 2023-12-21 NOTE — Telephone Encounter (Signed)
 Patient gave verbal understanding and had no further questions,

## 2023-12-22 ENCOUNTER — Telehealth: Payer: Self-pay | Admitting: *Deleted

## 2023-12-22 NOTE — Telephone Encounter (Signed)
 Michael Good wants to have his lab/injection on 12/16 same day as his DOTATATE scan. Informed him you can't have the injection before the scan because it can affect the result and infusion area and lab will be closed since his scan is at 5 pm (as he requested). He wants his scan review to be done via telephone because he can't leave work. He asked nurse to cancel his lab/injection appointment on 12/18.

## 2023-12-27 ENCOUNTER — Other Ambulatory Visit: Payer: Self-pay

## 2023-12-27 NOTE — Progress Notes (Signed)
 Specialty Pharmacy Refill Coordination Note  Michael Mathieu. is a 68 y.o. male contacted today regarding refills of specialty medication(s) Everolimus  (AFINITOR )   Patient requested Delivery   Delivery date: 12/29/23   Verified address: [address] [place] [address]   Medication will be filled on: 12/28/23

## 2023-12-28 ENCOUNTER — Other Ambulatory Visit: Payer: Self-pay

## 2024-01-17 ENCOUNTER — Other Ambulatory Visit: Payer: Self-pay

## 2024-01-17 ENCOUNTER — Telehealth: Payer: Self-pay

## 2024-01-17 ENCOUNTER — Inpatient Hospital Stay

## 2024-01-17 ENCOUNTER — Other Ambulatory Visit: Payer: Self-pay | Admitting: *Deleted

## 2024-01-17 ENCOUNTER — Encounter: Payer: Self-pay | Admitting: Nutrition

## 2024-01-17 ENCOUNTER — Inpatient Hospital Stay: Attending: Oncology

## 2024-01-17 VITALS — BP 160/81 | HR 70 | Temp 98.1°F | Resp 17

## 2024-01-17 DIAGNOSIS — C7B03 Secondary carcinoid tumors of bone: Secondary | ICD-10-CM | POA: Diagnosis present

## 2024-01-17 DIAGNOSIS — C7A090 Malignant carcinoid tumor of the bronchus and lung: Secondary | ICD-10-CM | POA: Insufficient documentation

## 2024-01-17 DIAGNOSIS — C7B02 Secondary carcinoid tumors of liver: Secondary | ICD-10-CM | POA: Insufficient documentation

## 2024-01-17 LAB — CBC WITH DIFFERENTIAL (CANCER CENTER ONLY)
Abs Immature Granulocytes: 0.01 K/uL (ref 0.00–0.07)
Basophils Absolute: 0 K/uL (ref 0.0–0.1)
Basophils Relative: 0 %
Eosinophils Absolute: 0 K/uL (ref 0.0–0.5)
Eosinophils Relative: 1 %
HCT: 33.8 % — ABNORMAL LOW (ref 39.0–52.0)
Hemoglobin: 12.6 g/dL — ABNORMAL LOW (ref 13.0–17.0)
Immature Granulocytes: 0 %
Lymphocytes Relative: 23 %
Lymphs Abs: 0.7 K/uL (ref 0.7–4.0)
MCH: 30.3 pg (ref 26.0–34.0)
MCHC: 37.3 g/dL — ABNORMAL HIGH (ref 30.0–36.0)
MCV: 81.3 fL (ref 80.0–100.0)
Monocytes Absolute: 0.2 K/uL (ref 0.1–1.0)
Monocytes Relative: 7 %
Neutro Abs: 2.1 K/uL (ref 1.7–7.7)
Neutrophils Relative %: 69 %
Platelet Count: 70 K/uL — ABNORMAL LOW (ref 150–400)
RBC: 4.16 MIL/uL — ABNORMAL LOW (ref 4.22–5.81)
RDW: 13.2 % (ref 11.5–15.5)
WBC Count: 3.1 K/uL — ABNORMAL LOW (ref 4.0–10.5)
nRBC: 0 % (ref 0.0–0.2)

## 2024-01-17 LAB — CMP (CANCER CENTER ONLY)
ALT: 34 U/L (ref 0–44)
AST: 25 U/L (ref 15–41)
Albumin: 4.3 g/dL (ref 3.5–5.0)
Alkaline Phosphatase: 143 U/L — ABNORMAL HIGH (ref 38–126)
Anion gap: 11 (ref 5–15)
BUN: 17 mg/dL (ref 8–23)
CO2: 26 mmol/L (ref 22–32)
Calcium: 9 mg/dL (ref 8.9–10.3)
Chloride: 99 mmol/L (ref 98–111)
Creatinine: 1.2 mg/dL (ref 0.61–1.24)
GFR, Estimated: >60 mL/min (ref >60)
Glucose, Bld: 501 mg/dL (ref 70–99)
Potassium: 4.1 mmol/L (ref 3.5–5.1)
Sodium: 135 mmol/L (ref 135–145)
Total Bilirubin: 1.1 mg/dL (ref 0.0–1.2)
Total Protein: 6.8 g/dL (ref 6.5–8.1)

## 2024-01-17 MED ORDER — OCTREOTIDE ACETATE 30 MG IM KIT
30.0000 mg | PACK | Freq: Once | INTRAMUSCULAR | Status: AC
  Administered 2024-01-17: 30 mg via INTRAMUSCULAR
  Filled 2024-01-17: qty 1

## 2024-01-17 NOTE — Progress Notes (Signed)
 Gave patient a case of Ensure today and made referral to dietician as ordered by dietician to continue to receive the Ensure.

## 2024-01-17 NOTE — Patient Instructions (Signed)
 [facility] - A DEPT OF [facility]  Discharge Instructions: Thank you for choosing [facility] to provide your oncology and hematology care.   If you have a lab appointment with the Cancer Center, please go directly to the Cancer Center and check in at the registration area.   Wear comfortable clothing and clothing appropriate for easy access to any Portacath or PICC line.   We strive to give you quality time with your provider. You may need to reschedule your appointment if you arrive late (15 or more minutes).  Arriving late affects you and other patients whose appointments are after yours.  Also, if you miss three or more appointments without notifying the office, you may be dismissed from the clinic at the provider's discretion.      For prescription refill requests, have your pharmacy contact our office and allow 72 hours for refills to be completed.    Today you received the following chemotherapy and/or immunotherapy agents: sandostatin       To help prevent nausea and vomiting after your treatment, we encourage you to take your nausea medication as directed.  BELOW ARE SYMPTOMS THAT SHOULD BE REPORTED IMMEDIATELY: *FEVER GREATER THAN 100.4 F (38 C) OR HIGHER *CHILLS OR SWEATING *NAUSEA AND VOMITING THAT IS NOT CONTROLLED WITH YOUR NAUSEA MEDICATION *UNUSUAL SHORTNESS OF BREATH *UNUSUAL BRUISING OR BLEEDING *URINARY PROBLEMS (pain or burning when urinating, or frequent urination) *BOWEL PROBLEMS (unusual diarrhea, constipation, pain near the anus) TENDERNESS IN MOUTH AND THROAT WITH OR WITHOUT PRESENCE OF ULCERS (sore throat, sores in mouth, or a toothache) UNUSUAL RASH, SWELLING OR PAIN  UNUSUAL VAGINAL DISCHARGE OR ITCHING   Items with * indicate a potential emergency and should be followed up as soon as possible or go to the Emergency Department if any problems should occur.  Please show the CHEMOTHERAPY ALERT CARD or  IMMUNOTHERAPY ALERT CARD at check-in to the Emergency Department and triage nurse.  Should you have questions after your visit or need to cancel or reschedule your appointment, please contact [facility] - A DEPT OF [facility]  Dept: [phone]  and follow the prompts.  Office hours are 8:00 a.m. to 4:30 p.m. Monday - Friday. Please note that voicemails left after 4:00 p.m. may not be returned until the following business day.  We are closed weekends and major holidays. You have access to a nurse at all times for urgent questions. Please call the main number to the clinic Dept: [phone] and follow the prompts.   For any non-urgent questions, you may also contact your provider using MyChart. We now offer e-Visits for anyone 42 and older to request care online for non-urgent symptoms. For details visit [email or web address].   Also download the MyChart app! Go to the app store, search MyChart, open the app, select [facility], and log in with your MyChart username and password.

## 2024-01-17 NOTE — Telephone Encounter (Signed)
 CRITICAL VALUE STICKER  CRITICAL VALUE:glucose 501  RECEIVER (on-site recipient of call):Elizaveta Mattice  DATE & TIME NOTIFIED: 01/17/2024 1618  MESSENGER (representative from lab):James  MD NOTIFIED: GBS  TIME OF NOTIFICATION:1618  RESPONSE:  The patient was advised to visit the emergency department for evaluation.

## 2024-01-18 ENCOUNTER — Encounter: Payer: Self-pay | Admitting: *Deleted

## 2024-01-18 ENCOUNTER — Other Ambulatory Visit (HOSPITAL_COMMUNITY): Payer: Self-pay

## 2024-01-18 LAB — CHROMOGRANIN A: Chromogranin A (ng/mL): 950.2 ng/mL — ABNORMAL HIGH (ref 0.0–101.8)

## 2024-01-18 NOTE — Progress Notes (Unsigned)
 Mr. Michiels brought in

## 2024-01-18 NOTE — Progress Notes (Signed)
 Michael Good dropped of letter/forms and financials for renewal of his patient assistance for Sandostatin  LAR for next year. Emailed to managed care.

## 2024-01-23 ENCOUNTER — Other Ambulatory Visit: Payer: Self-pay

## 2024-01-26 ENCOUNTER — Other Ambulatory Visit: Payer: Self-pay

## 2024-01-26 ENCOUNTER — Other Ambulatory Visit (HOSPITAL_COMMUNITY): Payer: Self-pay

## 2024-01-29 ENCOUNTER — Inpatient Hospital Stay: Attending: Oncology | Admitting: Nutrition

## 2024-01-29 DIAGNOSIS — C7B02 Secondary carcinoid tumors of liver: Secondary | ICD-10-CM | POA: Insufficient documentation

## 2024-01-29 DIAGNOSIS — C7B03 Secondary carcinoid tumors of bone: Secondary | ICD-10-CM | POA: Insufficient documentation

## 2024-01-29 DIAGNOSIS — M25511 Pain in right shoulder: Secondary | ICD-10-CM | POA: Insufficient documentation

## 2024-01-29 DIAGNOSIS — R630 Anorexia: Secondary | ICD-10-CM | POA: Insufficient documentation

## 2024-01-29 DIAGNOSIS — M25512 Pain in left shoulder: Secondary | ICD-10-CM | POA: Insufficient documentation

## 2024-01-29 DIAGNOSIS — C7A090 Malignant carcinoid tumor of the bronchus and lung: Secondary | ICD-10-CM | POA: Insufficient documentation

## 2024-01-29 NOTE — Progress Notes (Signed)
 Telephone follow up completed with patient who receives Everolimus  and Sandostatin  for Carcinoid tumor. He is followed by Dr. Cloretta.  Weight documented as 170.6 pounds September 24..  Labs include Glucose of 501 on November 19. Patient on Metformin .  Reports poor appetite and decreased energy level. He does well as long as he is moving around but if he rests, he falls asleep. He does not regularly check his blood sugars. He takes metformin  but has not followed up with his PCP to adjust medications for ongoing high blood sugars. He drinks diet soft drinks. He doesn't follow a special diet. He was drinking one carton of Ensure Plus or equivalent daily but is willing to drink more since he recognizes his weight loss.He reports food insecurity.  Nutrition Diagnosis: Inadequate oral intake, ongoing  Intervention: Patient referred to PCP to manage elevated blood sugar levels. Education provided on how uncontrolled blood sugar can affect the body. Encouraged small, frequent meals and snacks with protein at every meal/snack. Reviewed protein foods. Increase Ensure Plus or equivalent 3 times daily between meals. If blood sugars remain elevated, may need to switch to Glucerna or equivalent. Mailed nutrition information with contact number.  Monitoring, Evaluation, Goals: Tolerate adequate calories and protein to minimize weight loss.  Next Visit: To be scheduled as needed.

## 2024-01-30 ENCOUNTER — Other Ambulatory Visit (HOSPITAL_COMMUNITY): Payer: Self-pay

## 2024-02-07 ENCOUNTER — Other Ambulatory Visit (HOSPITAL_COMMUNITY): Payer: Self-pay

## 2024-02-07 ENCOUNTER — Other Ambulatory Visit: Payer: Self-pay

## 2024-02-07 NOTE — Progress Notes (Deleted)
 Specialty Pharmacy Refill Coordination Note  Yareth Macdonnell. is a 68 y.o. male contacted today regarding refills of specialty medication(s) Everolimus  (AFINITOR )   Patient requested Delivery   Delivery date: 02/09/24   Verified address: [address] [place] [address]   Medication will be filled on: 02/08/24

## 2024-02-07 NOTE — Progress Notes (Signed)
 Medication did not make it with UPS today. Patient was notified of delay via MyChart.

## 2024-02-07 NOTE — Progress Notes (Signed)
 Specialty Pharmacy Refill Coordination Note  Mordche Hedglin. is a 68 y.o. male contacted today regarding refills of specialty medication(s) Everolimus  (AFINITOR )   Patient requested Delivery   Delivery date: 02/08/24   Verified address: [address] [place] [address]   Medication will be filled on: 02/07/24

## 2024-02-08 ENCOUNTER — Other Ambulatory Visit (HOSPITAL_COMMUNITY): Payer: Self-pay

## 2024-02-13 ENCOUNTER — Inpatient Hospital Stay

## 2024-02-13 ENCOUNTER — Encounter (HOSPITAL_COMMUNITY): Admission: RE | Admit: 2024-02-13 | Source: Ambulatory Visit

## 2024-02-13 VITALS — BP 136/79 | HR 88 | Temp 98.0°F | Resp 20

## 2024-02-13 DIAGNOSIS — C7A090 Malignant carcinoid tumor of the bronchus and lung: Secondary | ICD-10-CM | POA: Diagnosis present

## 2024-02-13 DIAGNOSIS — C7B02 Secondary carcinoid tumors of liver: Secondary | ICD-10-CM | POA: Diagnosis present

## 2024-02-13 DIAGNOSIS — R630 Anorexia: Secondary | ICD-10-CM | POA: Diagnosis not present

## 2024-02-13 DIAGNOSIS — M25511 Pain in right shoulder: Secondary | ICD-10-CM | POA: Diagnosis not present

## 2024-02-13 DIAGNOSIS — C7B03 Secondary carcinoid tumors of bone: Secondary | ICD-10-CM | POA: Diagnosis present

## 2024-02-13 DIAGNOSIS — M25512 Pain in left shoulder: Secondary | ICD-10-CM | POA: Diagnosis not present

## 2024-02-13 MED ORDER — OCTREOTIDE ACETATE 30 MG IM KIT
30.0000 mg | PACK | Freq: Once | INTRAMUSCULAR | Status: AC
  Administered 2024-02-13: 18:00:00 30 mg via INTRAMUSCULAR
  Filled 2024-02-13: qty 1

## 2024-02-13 MED ORDER — COPPER CU 64 DOTATATE 1 MCI/ML IV SOLN
4.0000 | Freq: Once | INTRAVENOUS | Status: AC
  Administered 2024-02-13: 16:00:00 4.25 via INTRAVENOUS

## 2024-02-15 ENCOUNTER — Ambulatory Visit

## 2024-02-15 ENCOUNTER — Other Ambulatory Visit

## 2024-02-15 ENCOUNTER — Inpatient Hospital Stay: Admitting: Oncology

## 2024-02-15 ENCOUNTER — Other Ambulatory Visit: Admitting: Oncology

## 2024-02-15 DIAGNOSIS — C7A090 Malignant carcinoid tumor of the bronchus and lung: Secondary | ICD-10-CM

## 2024-02-15 NOTE — Progress Notes (Signed)
 [facility]   HEMATOLOGY- ONCOLOGY TeleHEALTH OFFICE VISIT PROGRESS NOTE  I connected with [place]co Ada on 02/15/2024 at  2:40 PM EST by telephone and verified that I am speaking with the correct person using two identifiers.   I discussed the limitations, risks, security and privacy concerns of performing an evaluation and management service by telemedicine and the availability of in-person appointments. I also discussed with the patient that there may be a patient responsible charge related to this service. The patient expressed understanding and agreed to proceed.   Patient's location: Home Provider's location: Office    Diagnosis: Metastatic carcinoid tumor  INTERVAL HISTORY:   Mr. Salton returns as scheduled.  He continues monthly Sandostatin .  He reports occasional bilateral shoulder pain.  He has a poor appetite.  He currently has an upper respiratory infection.  No fever.  No consistent diarrhea or flushing. He bruises easily.  No mouth sores.   Lab Results:  Lab Results  Component Value Date   WBC 3.1 (L) 01/17/2024   HGB 12.6 (L) 01/17/2024   HCT 33.8 (L) 01/17/2024   MCV 81.3 01/17/2024   PLT 70 (L) 01/17/2024   NEUTROABS 2.1 01/17/2024    Imaging:  NM PET DOTATATE SKULL BASE TO MID THIGH Result Date: 02/13/2024 EXAM: PET AND CT SKULL BASE TO MID THIGH 02/13/2024 04:47:17 PM TECHNIQUE: RADIOPHARMACEUTICAL: 4.25 mCi CU-64 DOTATATE Uptake time 60 minutes. PET imaging was acquired from the base of the skull to the mid thighs. Non-contrast enhanced computed tomography was obtained for attenuation correction and anatomic localization. COMPARISON: Prev. Dotatate Scan 02/14/2023. CLINICAL HISTORY: Restage metastatic carcinoid, Malignant carcinoid tumor of bronchus and lung. FINDINGS: Intense multifocal avid lesions. There is a similar pattern to the comparison exam. Several lesions have increased in activity, but overall the pattern is unchanged. HEAD AND  NECK: Physiologic activity within the pituitary gland, salivary glands, and thyroid gland. No DOTATATE-avid cervical lymphadenopathy. CHEST: No DOTATATE-avid pulmonary nodules or lymphadenopathy. ABDOMEN AND PELVIS: Physiologic activity within the spleen, adrenal glands, kidneys, and bowel. **Liver:** Lesions in the liver involving the right hepatic lobe with SUV max equal 44.2 compared to 42.8. Lesion in the central left hepatic lobe with SUV max equal 64 compared to 45. Approximately 20 lesions in the liver, no new lesions. **Pancreas:** Perihilar lesion of the tail of the pancreas with SUV max of 25 compared to 37. No change in size, small lesion difficult to find on CT. No mesenteric or bowel lesion. No bowel obstruction. No DOTATATE-avid intraperitoneal mass or lymphadenopathy. BONES AND SOFT TISSUE: **Skeleton:** Approximately 30 lesions in the skeleton. No interval change other than some mild increase in activity. For example, lesion in the proximal right humerus with SUV max of 29 compared to 18. Lesion in the right scapula with SUV max of 18 compared to 10. No DOTATATE activity within the soft tissues. IMPRESSION: 1. Multifocal DOTATATE-avid metastatic disease in the liver and skeleton, overall stable distribution with mild interval increase in uptake in some lesions and no new lesions. 2. Stable DOTATATE-avid lesion near the pancreatic tail, with no mesenteric or bowel lesion and no bowel obstruction. Electronically signed by: Norleen Boxer MD 02/13/2024 05:08 PM EST RP Workstation: [id number]    Medications: I have reviewed the patient's current medications.  Assessment/Plan: Metastatic carcinoid tumor Left lower lobectomy February 2004 at [facility], 2.2 cm typical carcinoid tumor Laparoscopic cholecystectomy November 2014, imaging revealed a 4.4 cm segment 8 liver lesion 03/18/2013-central hepatectomy and drainage of intra-abdominal abscess,- low-grade neuroendocrine  carcinoma, tumor appeared totally  excised,Ki-67 less than 5% CT's 05/09/2014, 08/16/2016, 09/19/2017-no evidence of recurrent disease CTs 914 2020-3 new liver lesions 12/26/2018 PET dotatate scan-at least 20 liver lesions and extensive bone metastases, hypermetabolic pancreas tail lesion 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 01/16/2019-biopsy of the left hepatic lobe lesion-metastatic neuroendocrine carcinoma,Ki-67 10%- interpretation limited due to scant remaining tumor may not be representative of the overall lesion, mismatch repair protein staining intact 02/27/2019-Sandostatin  30 mg every 4 weeks 09/18/2019- Netspot  PET-increased activity in the pancreas tail and liver and bone metastases Late August 2021-everolimus , 7.5 mg daily, placed on hold September 2021 due to acute hyperglycemia/new onset diabetes 01/22/2020-everolimus  resumed Netspot  09/29/2020-innumerable liver metastases, unchanged, stable tracer uptake in distal pancreas lesion, innumerable bone lesions, significant decrease in tracer uptake in left humeral lesion, mild decrease in FDG uptake in other bone lesions, no new site of metastatic disease Netspot  02/02/2022-no change from scan on 09/29/2020, multifocal hepatic and bone metastases.  Small tail of the pancreas lesion Sandostatin  and everolimus  continued Netspot  02/14/2023-essentially identical pattern of metastatic well-differentiated tumor compared to dotatate PET scan 02/02/2022.  No change in number, pattern or intensity of lesions.  No new lesions.  Stable small lesion in the tail of the pancreas. 02/13/2024 dotatate PET: Multifocal dotatate avid liver and bone metastases, increased uptake in some lesions, no new lesions, stable dotatate avid lesion near the pancreas tail Diabetes ERCP removal of bile duct stones March 2015, March 2018 Sleep apnea Hypertension COVID-19 infection September 2021 Cholecystectomy November 2014 Umbilical hernia repair 03/18/2013 Erectile dysfunction Chronic  thrombocytopenia     Disposition: [name] has a metastatic neuroendocrine tumor, potentially arising in the lung.  He continues daily everolimus  and monthly Sandostatin .  There is no clear evidence of disease progression based on his symptoms or the restaging PET.  He will continue the current treatment.  We will check a chromogranin a level when he returns in 2 months.  The chromogranin a has been stable over the past year.  He plans to begin a trial of testosterone for erectile dysfunction and anorexia.  [name] will return for Sandostatin  in 4 weeks and an office visit in 8 weeks.  I discussed the assessment and treatment plan with the patient. The patient was provided an opportunity to ask questions and all were answered. The patient agreed with the plan and demonstrated an understanding of the instructions.   The patient was advised to call back or seek an in-person evaluation if the symptoms worsen or if the condition fails to improve as anticipated.  I provided 20 minutes of chart review, pet imaging review, documentation, and telephone time during this encounter, and > 50% was spent counseling as documented under my assessment & plan.  Arley Hof MD  02/15/2024 3:12 PM

## 2024-03-01 ENCOUNTER — Other Ambulatory Visit: Payer: Self-pay | Admitting: Oncology

## 2024-03-01 ENCOUNTER — Other Ambulatory Visit: Payer: Self-pay

## 2024-03-01 ENCOUNTER — Other Ambulatory Visit (HOSPITAL_COMMUNITY): Payer: Self-pay

## 2024-03-01 DIAGNOSIS — C7A090 Malignant carcinoid tumor of the bronchus and lung: Secondary | ICD-10-CM

## 2024-03-01 MED ORDER — EVEROLIMUS 7.5 MG PO TABS
ORAL_TABLET | Freq: Every day | ORAL | 2 refills | Status: AC
  Filled 2024-03-01: qty 30, fill #0
  Filled 2024-03-04: qty 30, 30d supply, fill #0
  Filled 2024-03-29: qty 30, 30d supply, fill #1

## 2024-03-04 ENCOUNTER — Other Ambulatory Visit: Payer: Self-pay

## 2024-03-04 NOTE — Progress Notes (Signed)
 Specialty Pharmacy Refill Coordination Note  Gurpreet Mikhail. is a 69 y.o. male contacted today regarding refills of specialty medication(s) Everolimus  (AFINITOR )   Patient requested Delivery   Delivery date: 03/07/24   Verified address: [address] [place] [address]   Medication will be filled on: 03/06/24

## 2024-03-05 ENCOUNTER — Other Ambulatory Visit: Payer: Self-pay

## 2024-03-06 ENCOUNTER — Other Ambulatory Visit: Payer: Self-pay

## 2024-03-13 ENCOUNTER — Inpatient Hospital Stay: Attending: Oncology

## 2024-03-13 VITALS — BP 141/73 | HR 90 | Temp 99.1°F | Resp 16

## 2024-03-13 DIAGNOSIS — C7A090 Malignant carcinoid tumor of the bronchus and lung: Secondary | ICD-10-CM | POA: Diagnosis present

## 2024-03-13 DIAGNOSIS — C7B02 Secondary carcinoid tumors of liver: Secondary | ICD-10-CM | POA: Insufficient documentation

## 2024-03-13 DIAGNOSIS — C7B03 Secondary carcinoid tumors of bone: Secondary | ICD-10-CM | POA: Insufficient documentation

## 2024-03-13 MED ORDER — OCTREOTIDE ACETATE 30 MG IM KIT
30.0000 mg | PACK | Freq: Once | INTRAMUSCULAR | Status: AC
  Administered 2024-03-13: 30 mg via INTRAMUSCULAR
  Filled 2024-03-13: qty 1

## 2024-03-13 NOTE — Patient Instructions (Signed)
 Octreotide Injection Suspension What is this medication? OCTREOTIDE (ok TREE oh tide) treats high levels of growth hormone (acromegaly). It works by reducing the amount of growth hormone your body makes. This reduces symptoms and the risk of health problems caused by too much growth hormone, such as diabetes and heart disease. It may also be used to treat diarrhea caused by neuroendocrine tumors. It works by slowing down the release of serotonin from the tumor cells. This reduces the number of bowel movements you have. This medicine may be used for other purposes; ask your health care provider or pharmacist if you have questions. COMMON BRAND NAME(S): Sandostatin LAR What should I tell my care team before I take this medication? They need to know if you have any of these conditions: Diabetes Gallbladder disease Heart disease Kidney disease Liver disease Pancreatic disease Thyroid disease An unusual or allergic reaction to octreotide, other medications, foods, dyes, or preservatives Pregnant or trying to get pregnant Breastfeeding How should I use this medication? This medication is injected into a muscle. It is usually given by your care team in a hospital or clinic setting. Talk to your care team about the use of this medication in children. Special care may be needed. Overdosage: If you think you have taken too much of this medicine contact a poison control center or emergency room at once. NOTE: This medicine is only for you. Do not share this medicine with others. What if I miss a dose? Keep appointments for follow-up doses. It is important not to miss your dose. Call your care team if you are unable to keep an appointment. What may interact with this medication? Do not take this medication with any of the following: Cisapride Dronedarone Flibanserin Lutetium Lu 177 dotatate Pimozide Saquinavir Thioridazine This medication may also interact with the  following: Bromocriptine Certain medications for blood pressure, heart disease, irregular heartbeat Cyclosporine Diuretics Medications for diabetes, including insulin Quinidine This list may not describe all possible interactions. Give your health care provider a list of all the medicines, herbs, non-prescription drugs, or dietary supplements you use. Also tell them if you smoke, drink alcohol, or use illegal drugs. Some items may interact with your medicine. What should I watch for while using this medication? Visit your care team for regular checks on your progress. Tell your care team if your symptoms do not start to get better or if they get worse. This medication may cause decreases in blood sugar. Signs of low blood sugar include chills, cool, pale skin or cold sweats, drowsiness, extreme hunger, fast heartbeat, headache, nausea, nervousness or anxiety, shakiness, trembling, unsteadiness, tiredness, or weakness. Contact your care team right away if you experience any of these symptoms. This medication may increase blood sugar. The risk may be higher in patients who already have diabetes. Ask your care team what you can do to lower your risk of diabetes while taking this medication. You should make sure you get enough vitamin B12 while you are taking this medication. Discuss the foods you eat and the vitamins you take with your care team. What side effects may I notice from receiving this medication? Side effects that you should report to your care team as soon as possible: Allergic reactions--skin rash, itching, hives, swelling of the face, lips, tongue, or throat Gallbladder problems--severe stomach pain, nausea, vomiting, fever Heart rhythm changes--fast or irregular heartbeat, dizziness, feeling faint or lightheaded, chest pain, trouble breathing High blood sugar (hyperglycemia)--increased thirst or amount of urine, unusual weakness or fatigue,  blurry vision Low blood sugar  (hypoglycemia)--tremors or shaking, anxiety, sweating, cold or clammy skin, confusion, dizziness, rapid heartbeat Low thyroid levels (hypothyroidism)--unusual weakness or fatigue, increased sensitivity to cold, constipation, hair loss, dry skin, weight gain, feelings of depression Low vitamin B12 level--pain, tingling, or numbness in the hands or feet, muscle weakness, dizziness, confusion, trouble concentrating Oily or light-colored stools, diarrhea, bloating, weight loss Pancreatitis--severe stomach pain that spreads to your back or gets worse after eating or when touched, fever, nausea, vomiting Slow heartbeat--dizziness, feeling faint or lightheaded, confusion, trouble breathing, unusual weakness or fatigue Side effects that usually do not require medical attention (report these to your care team if they continue or are bothersome): Diarrhea Dizziness Headache Nausea Pain, redness, or irritation at injection site Stomach pain This list may not describe all possible side effects. Call your doctor for medical advice about side effects. You may report side effects to FDA at 1-800-FDA-1088. Where should I keep my medication? This medication is given in a hospital or clinic. It will not be stored at home. NOTE: This sheet is a summary. It may not cover all possible information. If you have questions about this medicine, talk to your doctor, pharmacist, or health care provider.  2024 Elsevier/Gold Standard (2023-01-27 00:00:00)

## 2024-03-29 ENCOUNTER — Other Ambulatory Visit: Payer: Self-pay

## 2024-04-02 ENCOUNTER — Other Ambulatory Visit (HOSPITAL_COMMUNITY): Payer: Self-pay

## 2024-04-02 ENCOUNTER — Other Ambulatory Visit: Payer: Self-pay

## 2024-04-02 NOTE — Progress Notes (Signed)
 Specialty Pharmacy Refill Coordination Note  Michael Good. is a 69 y.o. male contacted today regarding refills of specialty medication(s) Everolimus  (AFINITOR )   Patient requested Delivery   Delivery date: 04/05/24   Verified address: [address] [place] [address]   Medication will be filled on: 04/04/24

## 2024-04-04 ENCOUNTER — Other Ambulatory Visit: Payer: Self-pay

## 2024-04-10 ENCOUNTER — Inpatient Hospital Stay: Admitting: Nurse Practitioner

## 2024-04-10 ENCOUNTER — Inpatient Hospital Stay
